# Patient Record
Sex: Male | Born: 1965 | Race: White | Hispanic: No | Marital: Married | Smoking: Current every day smoker
Health system: Southern US, Community
[De-identification: ages and names within clinical notes are randomized; demographics above are authoritative.]

## PROBLEM LIST (undated history)

## (undated) DIAGNOSIS — N401 Enlarged prostate with lower urinary tract symptoms: Secondary | ICD-10-CM

## (undated) DIAGNOSIS — F909 Attention-deficit hyperactivity disorder, unspecified type: Secondary | ICD-10-CM

## (undated) DIAGNOSIS — G4700 Insomnia, unspecified: Secondary | ICD-10-CM

## (undated) DIAGNOSIS — I10 Essential (primary) hypertension: Secondary | ICD-10-CM

## (undated) DIAGNOSIS — F411 Generalized anxiety disorder: Secondary | ICD-10-CM

## (undated) DIAGNOSIS — M25569 Pain in unspecified knee: Secondary | ICD-10-CM

## (undated) DIAGNOSIS — E781 Pure hyperglyceridemia: Secondary | ICD-10-CM

## (undated) HISTORY — DX: Attention-deficit hyperactivity disorder, unspecified type: F90.9

## (undated) HISTORY — DX: Essential (primary) hypertension: I10

## (undated) HISTORY — DX: Pure hyperglyceridemia: E78.1

## (undated) HISTORY — DX: Insomnia, unspecified: G47.00

## (undated) HISTORY — DX: Pain in unspecified knee: M25.569

## (undated) HISTORY — DX: Benign prostatic hyperplasia with lower urinary tract symptoms: N40.1

## (undated) HISTORY — DX: Generalized anxiety disorder: F41.1

---

## 2000-01-22 ENCOUNTER — Emergency Department (HOSPITAL_COMMUNITY): Admission: EM | Admit: 2000-01-22 | Discharge: 2000-01-22 | Payer: Self-pay | Admitting: Emergency Medicine

## 2000-01-22 ENCOUNTER — Encounter: Payer: Self-pay | Admitting: Emergency Medicine

## 2001-12-14 ENCOUNTER — Encounter: Payer: Self-pay | Admitting: Emergency Medicine

## 2001-12-14 ENCOUNTER — Emergency Department (HOSPITAL_COMMUNITY): Admission: EM | Admit: 2001-12-14 | Discharge: 2001-12-14 | Payer: Self-pay | Admitting: Emergency Medicine

## 2008-06-06 ENCOUNTER — Ambulatory Visit: Payer: Self-pay | Admitting: Family Medicine

## 2008-06-06 DIAGNOSIS — I10 Essential (primary) hypertension: Secondary | ICD-10-CM

## 2008-06-06 DIAGNOSIS — L723 Sebaceous cyst: Secondary | ICD-10-CM

## 2008-06-06 DIAGNOSIS — G4700 Insomnia, unspecified: Secondary | ICD-10-CM

## 2008-06-06 HISTORY — DX: Insomnia, unspecified: G47.00

## 2008-06-06 HISTORY — DX: Essential (primary) hypertension: I10

## 2008-06-26 ENCOUNTER — Ambulatory Visit: Payer: Self-pay | Admitting: Family Medicine

## 2008-06-29 ENCOUNTER — Telehealth: Payer: Self-pay | Admitting: Family Medicine

## 2008-07-10 ENCOUNTER — Ambulatory Visit: Payer: Self-pay | Admitting: Family Medicine

## 2009-01-15 ENCOUNTER — Ambulatory Visit: Payer: Self-pay | Admitting: Family Medicine

## 2009-01-15 DIAGNOSIS — F909 Attention-deficit hyperactivity disorder, unspecified type: Secondary | ICD-10-CM

## 2009-01-15 DIAGNOSIS — N138 Other obstructive and reflux uropathy: Secondary | ICD-10-CM

## 2009-01-15 DIAGNOSIS — F411 Generalized anxiety disorder: Secondary | ICD-10-CM | POA: Insufficient documentation

## 2009-01-15 DIAGNOSIS — K219 Gastro-esophageal reflux disease without esophagitis: Secondary | ICD-10-CM

## 2009-01-15 DIAGNOSIS — N401 Enlarged prostate with lower urinary tract symptoms: Secondary | ICD-10-CM | POA: Insufficient documentation

## 2009-01-15 HISTORY — DX: Attention-deficit hyperactivity disorder, unspecified type: F90.9

## 2009-01-15 HISTORY — DX: Generalized anxiety disorder: F41.1

## 2009-01-15 HISTORY — DX: Other obstructive and reflux uropathy: N13.8

## 2009-01-16 LAB — CONVERTED CEMR LAB
Alkaline Phosphatase: 93 units/L (ref 39–117)
BUN: 25 mg/dL — ABNORMAL HIGH (ref 6–23)
Creatinine, Ser: 1.1 mg/dL (ref 0.40–1.50)
Glucose, Bld: 96 mg/dL (ref 70–99)
HDL: 50 mg/dL (ref >39)
LDL Cholesterol: 70 mg/dL (ref 0–99)
Sodium: 141 meq/L (ref 135–145)
Total Bilirubin: 0.6 mg/dL (ref 0.3–1.2)
Total CHOL/HDL Ratio: 3.2
Triglycerides: 206 mg/dL — ABNORMAL HIGH (ref <150)
VLDL: 41 mg/dL — ABNORMAL HIGH (ref 0–40)

## 2009-01-25 ENCOUNTER — Telehealth: Payer: Self-pay | Admitting: Family Medicine

## 2009-08-12 ENCOUNTER — Ambulatory Visit: Payer: Self-pay | Admitting: Family Medicine

## 2009-08-12 ENCOUNTER — Encounter: Admission: RE | Admit: 2009-08-12 | Discharge: 2009-08-12 | Payer: Self-pay | Admitting: Family Medicine

## 2009-08-12 DIAGNOSIS — E781 Pure hyperglyceridemia: Secondary | ICD-10-CM

## 2009-08-12 DIAGNOSIS — M279 Disease of jaws, unspecified: Secondary | ICD-10-CM | POA: Insufficient documentation

## 2009-08-12 DIAGNOSIS — M25569 Pain in unspecified knee: Secondary | ICD-10-CM | POA: Insufficient documentation

## 2009-08-12 DIAGNOSIS — M79609 Pain in unspecified limb: Secondary | ICD-10-CM | POA: Insufficient documentation

## 2009-08-12 HISTORY — DX: Pure hyperglyceridemia: E78.1

## 2009-08-12 HISTORY — DX: Pain in unspecified knee: M25.569

## 2009-08-14 ENCOUNTER — Telehealth (INDEPENDENT_AMBULATORY_CARE_PROVIDER_SITE_OTHER): Payer: Self-pay | Admitting: *Deleted

## 2009-09-06 ENCOUNTER — Encounter: Payer: Self-pay | Admitting: Family Medicine

## 2009-10-07 ENCOUNTER — Telehealth (INDEPENDENT_AMBULATORY_CARE_PROVIDER_SITE_OTHER): Payer: Self-pay | Admitting: *Deleted

## 2009-10-11 ENCOUNTER — Ambulatory Visit (HOSPITAL_COMMUNITY): Admission: RE | Admit: 2009-10-11 | Discharge: 2009-10-11 | Payer: Self-pay | Admitting: Orthopedic Surgery

## 2009-11-01 ENCOUNTER — Encounter: Payer: Self-pay | Admitting: Family Medicine

## 2009-12-11 ENCOUNTER — Encounter: Payer: Self-pay | Admitting: Family Medicine

## 2010-02-25 ENCOUNTER — Ambulatory Visit: Payer: Self-pay | Admitting: Internal Medicine

## 2010-02-25 LAB — CONVERTED CEMR LAB
BUN: 17 mg/dL (ref 6–23)
Creatinine, Ser: 1.15 mg/dL (ref 0.40–1.50)

## 2010-02-26 ENCOUNTER — Encounter: Payer: Self-pay | Admitting: Internal Medicine

## 2010-09-30 NOTE — Consult Note (Signed)
Summary: [facility]  [facility]   Imported By: Lanelle Bal 01/03/2010 13:13:17  _____________________________________________________________________  External Attachment:    Type:   Image     Comment:   External Document

## 2010-09-30 NOTE — Consult Note (Signed)
Summary: [facility]  [facility]   Imported By: Lanelle Bal 09/24/2009 10:50:29  _____________________________________________________________________  External Attachment:    Type:   Image     Comment:   External Document

## 2010-09-30 NOTE — Assessment & Plan Note (Signed)
Summary: FU TO GET BP MEDS//VGJ   Vital Signs:  Patient profile:   45 year old male Height:      74 inches Weight:      221.25 pounds BMI:     28.51 O2 Sat:      96 % on Room air Temp:     98.5 degrees F oral Pulse rate:   89 / minute Pulse rhythm:   regular Resp:     18 per minute BP sitting:   108 / 60  (right arm) Cuff size:   large  Vitals Entered By: Glendell Docker CMA (February 25, 2010 3:48 PM)  O2 Flow:  Room air CC: Rm 3- Medication Refills , Lower Extremity Joint pain Is Patient Diabetic? No Pain Assessment Patient in pain? no      Comments Refill on lisinopril /hct and vicodin for knee pain/Arthritis, last appointment with [facility] was 01/13/2010. He states that he was advised by surgeon if he starts to have pain in his other knee, he would need to follow up with his primary care.    Primary Care Provider:  Nani Gasser MD  CC:  Rm 3- Medication Refills  and Lower Extremity Joint pain.  History of Present Illness:  Lower Extremity Joint Pain      This is a 45 year old man who presents with Lower Extremity Joint pain.  The patient reports stiffness for >1 hr and decreased ROM, but denies swelling and redness.  The pain is located in the left knee.  The pain is described as intermittent and activity related.  he was prev seen for right knee pain by ortho - [facility] .  s/p arthrocopy - medical meniscal pathology  htn - stable.  no dizziness, chest pain or cough  Preventive Screening-Counseling & Management  Alcohol-Tobacco     Smoking Status: current  Allergies (verified): No Known Drug Allergies  Past History:  Past Medical History: Psych - Dr. Sandria Manly in [place].    Past Surgical History: None    Family History: Prostate Ca MI HTN   Social History: Airline pilot for [employer]. Bachelors degree.  Quit tobacco Single Alcohol use-no Drug use-no   Regular exercise-yes  Physical Exam  General:  alert, well-developed, and  well-nourished.   Lungs:  Normal respiratory effort, chest expands symmetrically. Lungs are clear to auscultation, no crackles or wheezes. Heart:  Normal rate and regular rhythm. S1 and S2 normal without gallop, murmur, click, rub or other extra sounds.  NO carotid bruist.  Msk:  left knee - joint is stable.  medical knee tenderness   Impression & Recommendations:  Problem # 1:  KNEE PAIN (ICD-719.46) Left knee pain.  refilled pain meds.  arrange f/u with ortho  The following medications were removed from the medication list:    Hydrocodone-acetaminophen 5-500 Mg Tabs (Hydrocodone-acetaminophen) .Marland Kitchen... 1-2 by mouth at bedtime for severe pain relief. His updated medication list for this problem includes:    Tramadol Hcl 50 Mg Tabs (Tramadol hcl) .Marland Kitchen... Take 1 tablet by mouth two times a day as needed for pain  Orders: Orthopedic Referral (Ortho)  Problem # 2:  ESSENTIAL HYPERTENSION, BENIGN (ICD-401.1) Assessment: Unchanged stable.  Maintain current medication regimen.  His updated medication list for this problem includes:    Lisinopril-hydrochlorothiazide 20-12.5 Mg Tabs (Lisinopril-hydrochlorothiazide) .Marland Kitchen... Take one tablet by mouth once a day  BP today: 108/60 Prior BP: 131/68 (08/12/2009)  Prior 10 Yr Risk Heart Disease: 4 % (08/12/2009)  Labs Reviewed:  K+: 4.1 (01/15/2009) Creat: : 1.10 (01/15/2009)   Chol: 161 (01/15/2009)   HDL: 50 (01/15/2009)   LDL: 70 (01/15/2009)   TG: 206 (01/15/2009)  Complete Medication List: 1)  Lisinopril-hydrochlorothiazide 20-12.5 Mg Tabs (Lisinopril-hydrochlorothiazide) .... Take one tablet by mouth once a day 2)  Nexium 40 Mg Cpdr (Esomeprazole magnesium) .... Take 1 tablet by mouth once a day 3)  Adderall 20 Mg Tabs (Amphetamine-dextroamphetamine) .Marland Kitchen.. 1 by mouth three times a day 4)  Klonopin 1 Mg Tabs (Clonazepam) .Marland Kitchen.. 1 by mouth 2-3 times daily as needed 5)  Fish Oil 1000 Mg Caps (Omega-3 fatty acids) .... 2 by mouth daily 6)  Tramadol  Hcl 50 Mg Tabs (Tramadol hcl) .... Take 1 tablet by mouth two times a day as needed for pain  Other Orders: TLB-BMP (Basic Metabolic Panel-BMET) (80048-METABOL)  Patient Instructions: 1)  Please schedule a follow-up appointment in 6 months with Dr. Linford Arnold 2)  Our office will contact you re:  referral to Dr. Charlann Boxer 3)  Take 1 extra strenth tylenol with 50 mg tramadol together two times a day as needed. Prescriptions: TRAMADOL HCL 50 MG TABS (TRAMADOL HCL) Take 1 tablet by mouth two times a day as needed for pain  #30 x 0   Entered and Authorized by:   D. Thomos Lemons DO   Signed by:   D. Thomos Lemons DO on 02/25/2010   Method used:   Electronically to        [facility] (retail)       [address]       [place], [place]  [address]       Ph: [phone]       Fax: [phone]   RxID:   [id number] NEXIUM 40 MG CPDR (ESOMEPRAZOLE MAGNESIUM) Take 1 tablet by mouth once a day  #30 x 5   Entered and Authorized by:   D. Thomos Lemons DO   Signed by:   D. Thomos Lemons DO on 02/25/2010   Method used:   Electronically to        [facility] (retail)       [address]       [place], [place]  [address]       Ph: [phone]       Fax: [phone]   RxID:   [id number] LISINOPRIL-HYDROCHLOROTHIAZIDE 20-12.5 MG TABS (LISINOPRIL-HYDROCHLOROTHIAZIDE) Take one tablet by mouth once a day  #30 Tablet x 5   Entered and Authorized by:   D. Thomos Lemons DO   Signed by:   D. Thomos Lemons DO on 02/25/2010   Method used:   Electronically to        [facility] (retail)       [address]       [place], [place]  [address]       Ph: [phone]       Fax: [phone]   RxID:   [id number]   Current Allergies (reviewed today): No known allergies

## 2010-09-30 NOTE — Letter (Signed)
   [place] [facility] [address] [place], [place]  [address]  [place] Phone: [phone]      February 26, 2010   [place] [facility] [address] [place], [place] [address]  RE:  LAB RESULTS  Dear  Gary Stevens,  The following is an interpretation of your most recent lab tests.  Please take note of any instructions provided or changes to medications that have resulted from your lab work.  ELECTROLYTES:  Good - no changes needed  KIDNEY FUNCTION TESTS:  Good - no changes needed         Sincerely Yours,    Dr. Thomos Lemons  Appended Document:  Letter mailed.

## 2010-09-30 NOTE — Letter (Signed)
Summary: Letters Regarding Workers Comp/TPA for [employer]  Letters Regarding Workers Comp/TPA for [employer]   Imported By: Lanelle Bal 11/14/2009 12:06:57  _____________________________________________________________________  External Attachment:    Type:   Image     Comment:   External Document

## 2010-09-30 NOTE — Progress Notes (Signed)
Summary: left msg to call  Phone Note Call from Patient   Caller: Patient Summary of Call: pt called left msg on VM, wanted a call back. Caled and got VM left msg to call Kandice Hams  October 07, 2009 4:18 PM  Initial call taken by: Kandice Hams,  October 07, 2009 4:18 PM  Follow-up for Phone Call        pt called back was seen 08/12/09, saw Dr Charlann Boxer downstairs, not pleased with doctor was put on tRAMDOL WHICH IS NOT TOUCHING PAIN, pt wants percocet as given before.  Pt informed per Dr Eppie Gibson if not pleased can go to Ortho of choice and if  need referral we can send them pt agreed.Kandice Hams  October 07, 2009 4:46 PM  Follow-up by: Kandice Hams,  October 07, 2009 4:46 PM

## 2010-10-27 ENCOUNTER — Telehealth: Payer: Self-pay | Admitting: Family Medicine

## 2010-11-06 NOTE — Progress Notes (Signed)
Summary: bp refill request  Phone Note Refill Request Message from:  Patient on October 27, 2010 9:22 AM  Refills Requested: Medication #1:  LISINOPRIL-HYDROCHLOROTHIAZIDE 20-12.5 MG TABS Take one tablet by mouth once a day Pt has lost his job and insurance and needs a refill called into [facility] in [place]. Call patient and let him know [phone]  Initial call taken by: Michaelle Copas,  October 27, 2010 9:29 AM  Follow-up for Phone Call        OK for 90 day refill.  Follow-up by: Nani Gasser MD,  October 27, 2010 10:09 AM  Additional Follow-up for Phone Call Additional follow up Details #1::        called pt an told him refill sent for 90 days but needs to f/u . he has not been seen by Dr. Linford Arnold since 2010 Additional Follow-up by: Avon Gully CMA, Duncan Dull),  October 27, 2010 2:01 PM    Prescriptions: LISINOPRIL-HYDROCHLOROTHIAZIDE 20-12.5 MG TABS (LISINOPRIL-HYDROCHLOROTHIAZIDE) Take one tablet by mouth once a day  #90 x 0   Entered by:   Avon Gully CMA, (AAMA)   Authorized by:   Nani Gasser MD   Signed by:   Avon Gully CMA, (AAMA) on 10/27/2010   Method used:   Electronically to        [facility] [phone]* (retail)       [address]       [place], [place]  [address]       Ph: [phone]       Fax: [phone]   RxID:   [id number]

## 2011-12-20 IMAGING — CR DG ORBITS FOR FOREIGN BODY
2 series · 2 of 2 positions shown · non-contrast
Comparison: None.

CLINICAL DATA: Pre MRI, evaluate for orbital foreign body

ORBITS FOR FOREIGN BODY - 2 VIEW

[w waters (1 of 2)]
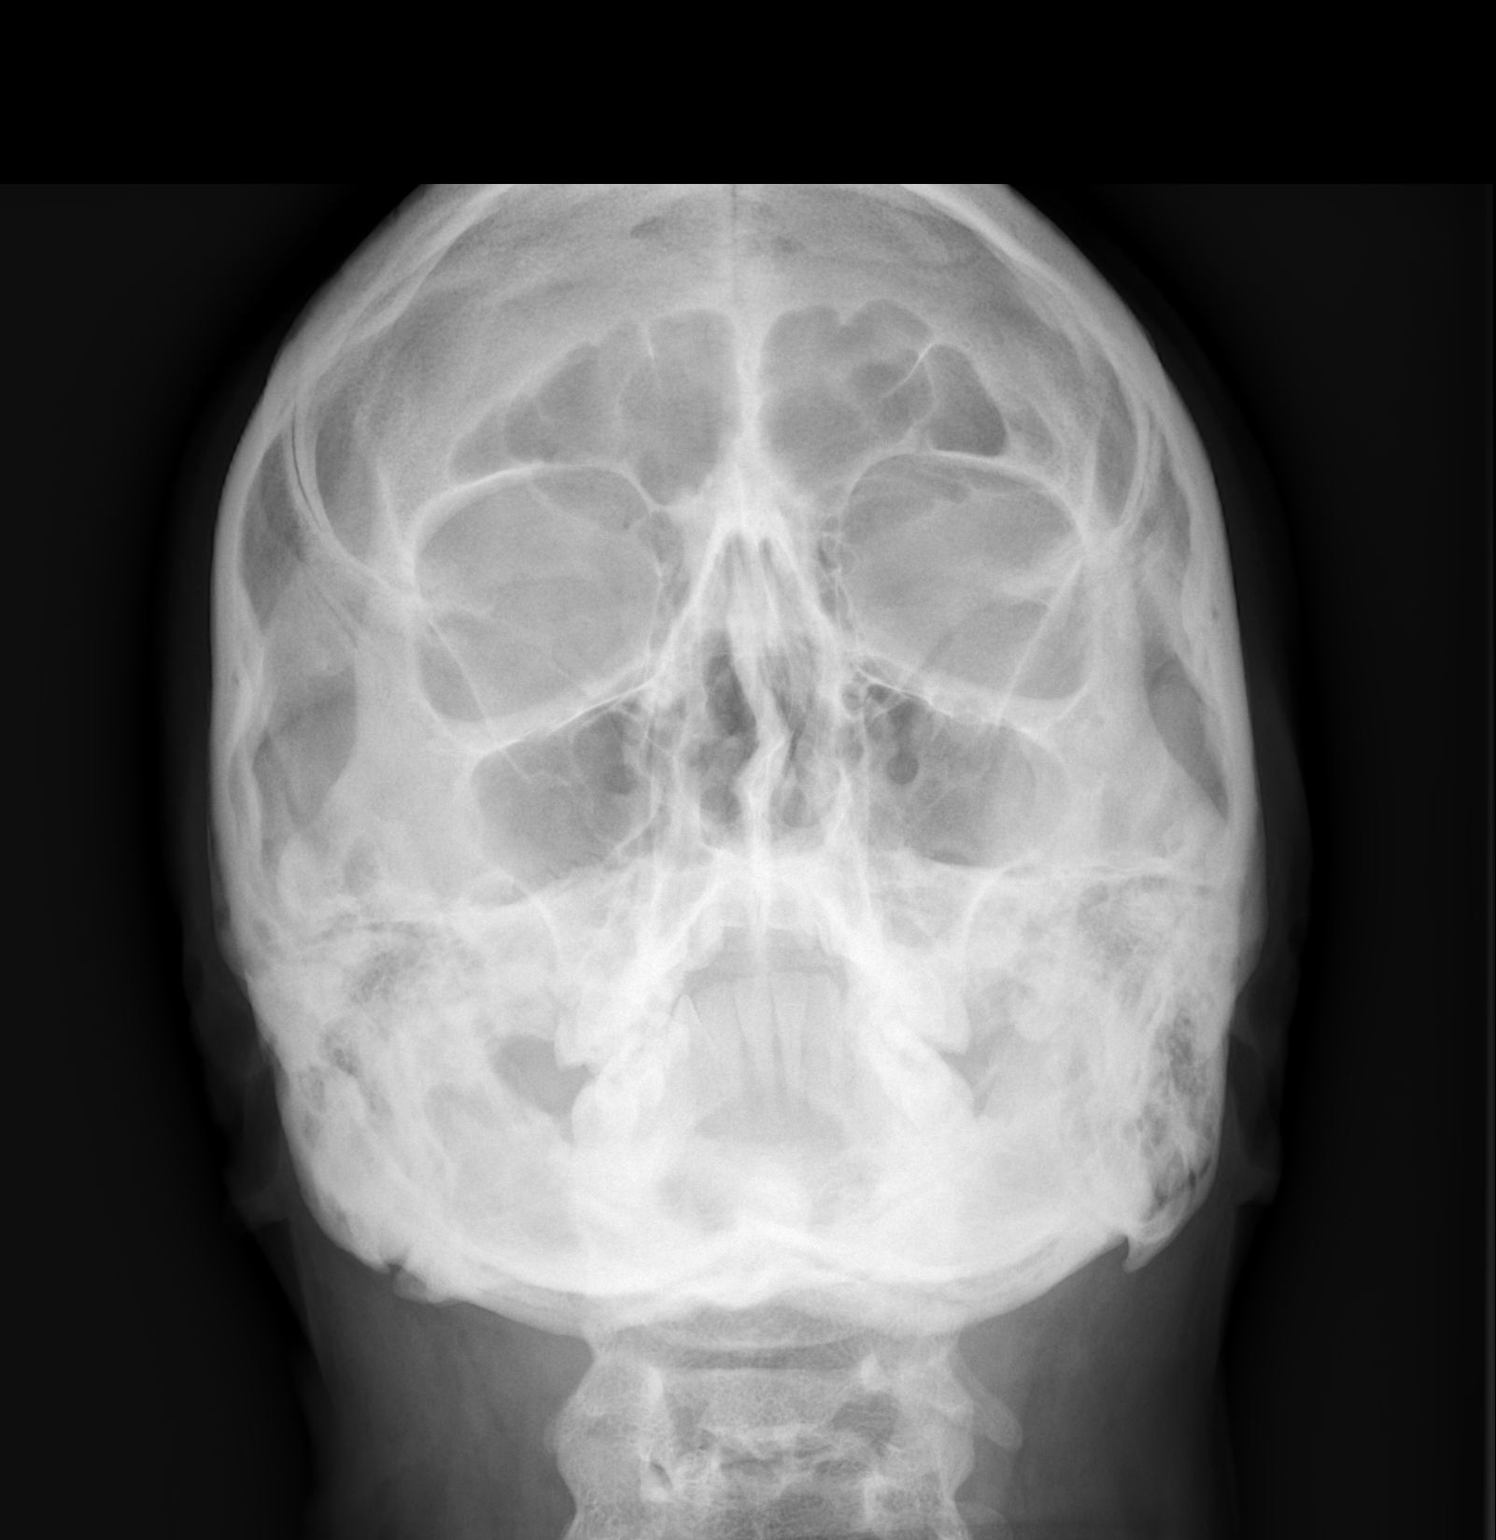

[w waters (2 of 2)]
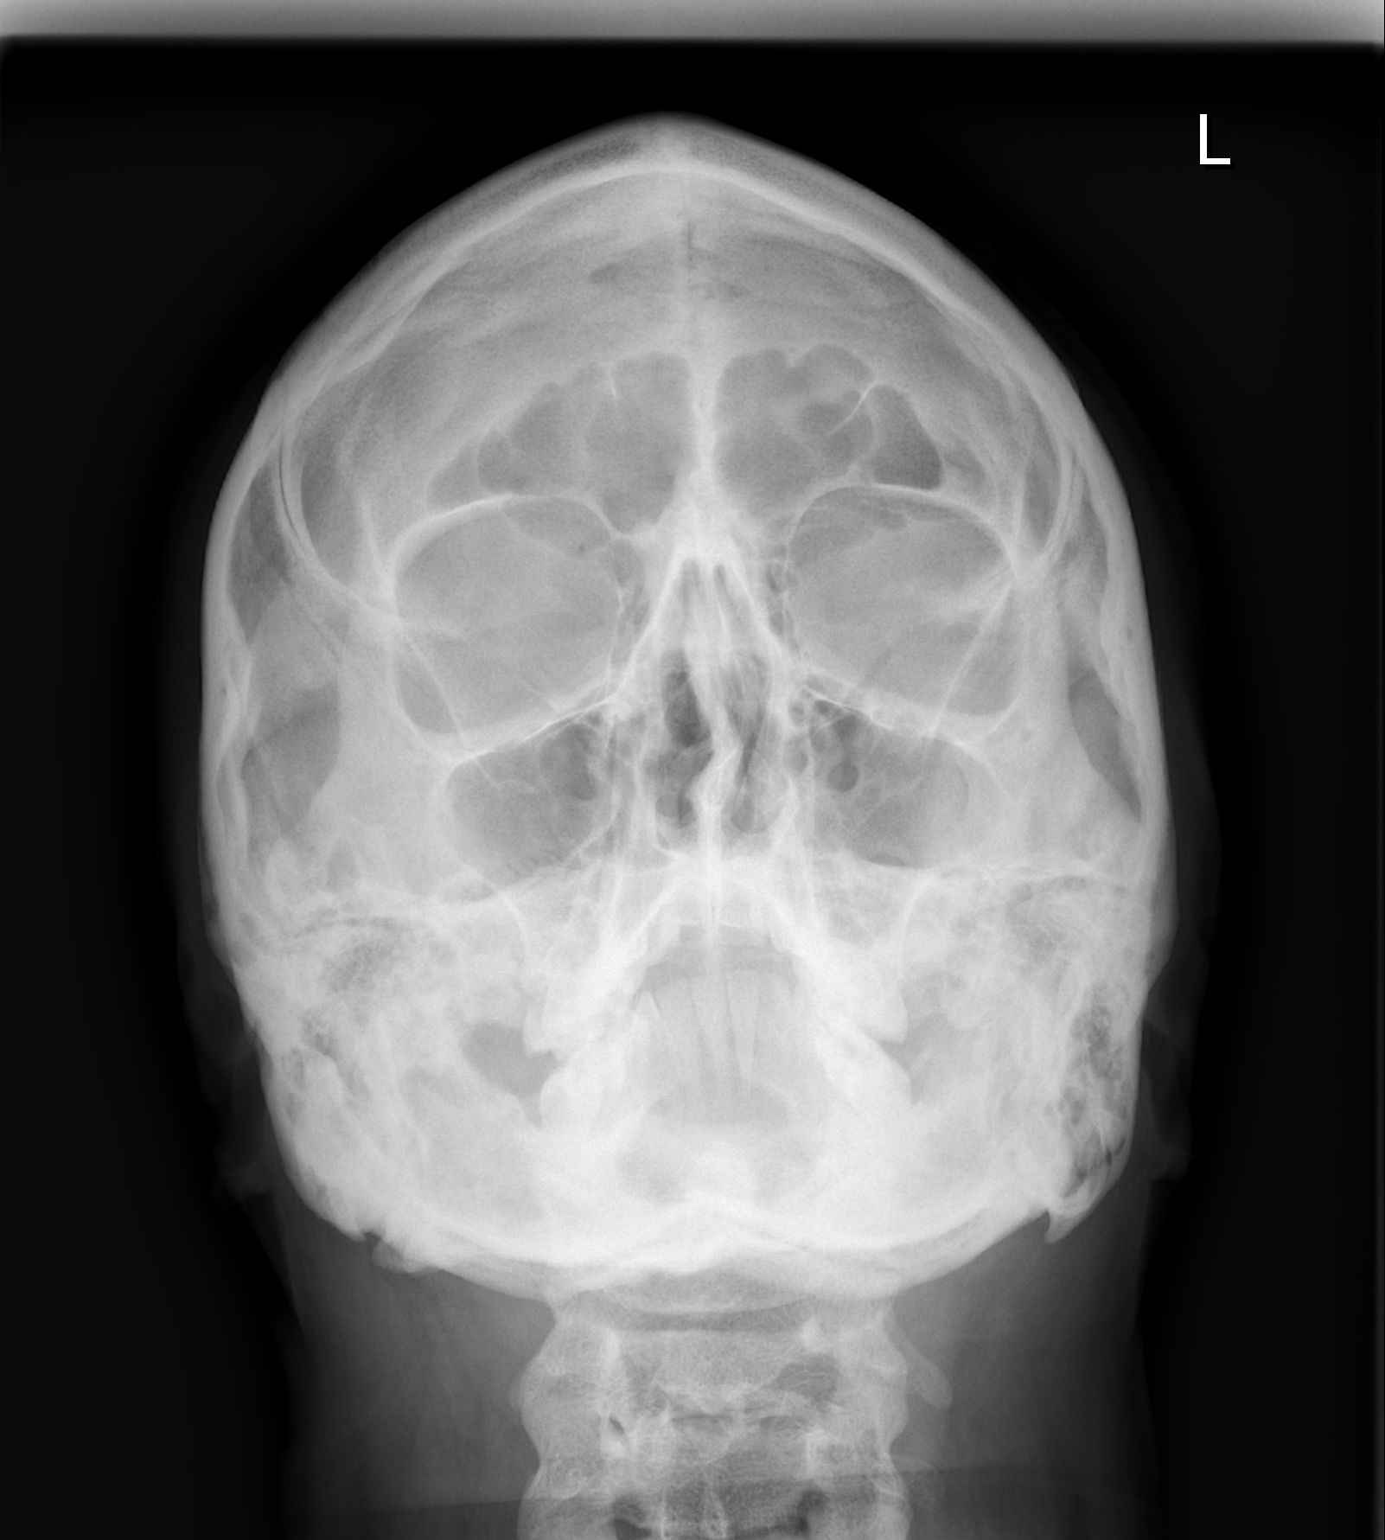

[2 of 2 positions shown; findings below may reference images not displayed]

FINDINGS: Views of the orbits were obtained with the the patient
looking to the left and looking to the right.  No orbital metallic
foreign body is seen.  The sinuses are clear.  No bony abnormality
is noted.
IMPRESSION: No orbital metallic foreign body.

## 2012-12-02 ENCOUNTER — Ambulatory Visit: Payer: Self-pay | Admitting: Family Medicine

## 2012-12-02 ENCOUNTER — Encounter: Payer: Self-pay | Admitting: Family Medicine

## 2012-12-02 ENCOUNTER — Ambulatory Visit (INDEPENDENT_AMBULATORY_CARE_PROVIDER_SITE_OTHER): Payer: BC Managed Care – PPO | Admitting: Family Medicine

## 2012-12-02 VITALS — BP 124/82 | HR 68 | Wt 260.0 lb

## 2012-12-02 DIAGNOSIS — I10 Essential (primary) hypertension: Secondary | ICD-10-CM

## 2012-12-02 MED ORDER — LISINOPRIL-HYDROCHLOROTHIAZIDE 20-25 MG PO TABS: 1.0000 | ORAL_TABLET | Freq: Every day | ORAL | Status: DC

## 2012-12-02 NOTE — Progress Notes (Signed)
CC: Gary Stevens is a 47 y.o. male is here for Hypertension   Subjective: HPI:  Patient presents for followup after 3 years absence from physically being in our office  Followup hypertension: Patient will be running out of lisinopril hydrochlorothiazide at the end of the month. He has only one outside blood pressure, 142/80 at his psychiatrist's office months ago. He denies missed doses or any side effects or intolerances of the above regimen. He denies headaches, vision change, motor sensory disturbance, angioedema, cough, shortness of breath, chest pain, muscle weakness, muscle cramps, nor limb claudication.   Review Of Systems Outlined In HPI  Past Medical History  Diagnosis Date  . KNEE PAIN 08/12/2009    Qualifier: Diagnosis of  By: Linford Arnold MD, Santina Evans    . INSOMNIA 06/06/2008    Qualifier: Diagnosis of  By: Linford Arnold MD, Santina Evans    . HYPERTROPHY PROSTATE W/UR OBST & OTH LUTS 01/15/2009    Qualifier: Diagnosis of  By: Linford Arnold MD, Santina Evans    . HYPERTRIGLYCERIDEMIA 08/12/2009    Qualifier: Diagnosis of  By: Linford Arnold MD, Santina Evans    . Essential hypertension, benign 06/06/2008    Qualifier: Diagnosis of  By: Linford Arnold MD, Santina Evans    . Anxiety state, unspecified 01/15/2009    Qualifier: Diagnosis of  By: Linford Arnold MD, Santina Evans    . ADHD 01/15/2009    Qualifier: Diagnosis of  By: Linford Arnold MD, Santina Evans       History reviewed. No pertinent family history.   History  Substance Use Topics  . Smoking status: Former Smoker    Quit date: 09/03/2012  . Smokeless tobacco: Not on file  . Alcohol Use: Not on file     Objective: Filed Vitals:   12/02/12 1611  BP: 124/82  Pulse: 68    General: Alert and Oriented, No Acute Distress HEENT: Pupils equal, round, reactive to light. Conjunctivae clear.   Moist mucous membranes, pharynx without inflammation nor lesions.  Neck supple without palpable lymphadenopathy nor abnormal masses. Lungs: Clear to auscultation bilaterally, no  wheezing/ronchi/rales.  Comfortable work of breathing. Good air movement. Cardiac: Regular rate and rhythm. Normal S1/S2.  No murmurs, rubs, nor gallops.   Abdomen: Soft nontender Extremities: No peripheral edema.  Strong peripheral pulses.  Mental Status: No depression, anxiety, nor agitation. Skin: Warm and dry.  Assessment & Plan: Gary Stevens was seen today for hypertension.  Diagnoses and associated orders for this visit:  Essential hypertension, benign - lisinopril-hydrochlorothiazide (PRINZIDE,ZESTORETIC) 20-25 MG per tablet; Take 1 tablet by mouth daily.    Essential hypertension: Controlled.  I've asked the patient to have cholesterol and metabolic panel performed as soon as possible once fasting.  He did not want lab slips, he will call us when ready. Stressed the importance of checking kidney function and electrolytes while on hydrochlorothiazide/lisinopril which I would like him to continue.  Congratulated him on his smoking cessation  Return in about 2 weeks (around 12/16/2012) for bloodwork.

## 2013-02-04 DIAGNOSIS — K279 Peptic ulcer, site unspecified, unspecified as acute or chronic, without hemorrhage or perforation: Secondary | ICD-10-CM | POA: Insufficient documentation

## 2013-02-04 DIAGNOSIS — F4010 Social phobia, unspecified: Secondary | ICD-10-CM | POA: Insufficient documentation

## 2013-02-04 DIAGNOSIS — F4001 Agoraphobia with panic disorder: Secondary | ICD-10-CM | POA: Insufficient documentation

## 2013-02-04 DIAGNOSIS — F4310 Post-traumatic stress disorder, unspecified: Secondary | ICD-10-CM | POA: Insufficient documentation

## 2013-02-04 HISTORY — DX: Agoraphobia with panic disorder: F40.01

## 2013-10-10 ENCOUNTER — Encounter: Payer: Self-pay | Admitting: Physician Assistant

## 2013-10-10 ENCOUNTER — Ambulatory Visit (INDEPENDENT_AMBULATORY_CARE_PROVIDER_SITE_OTHER): Payer: BC Managed Care – PPO | Admitting: Physician Assistant

## 2013-10-10 VITALS — BP 139/86 | HR 73 | Temp 97.8°F | Ht 75.0 in | Wt 278.0 lb

## 2013-10-10 DIAGNOSIS — I10 Essential (primary) hypertension: Secondary | ICD-10-CM

## 2013-10-10 DIAGNOSIS — J069 Acute upper respiratory infection, unspecified: Secondary | ICD-10-CM

## 2013-10-10 DIAGNOSIS — K219 Gastro-esophageal reflux disease without esophagitis: Secondary | ICD-10-CM

## 2013-10-10 DIAGNOSIS — E781 Pure hyperglyceridemia: Secondary | ICD-10-CM

## 2013-10-10 NOTE — Progress Notes (Signed)
   Subjective:    Patient ID: Gary Stevens, male    DOB: [birth year], 48 y.o.   MRN: [id number]  HPI  No concerns today. Needs refills.   HTN- pt needs refill today. He has not had labs since 2010. Seen Dr. Ivan AnchorsHommel in 2014 but did not get labs. Denies any CP, palpitations, SOB, headaches, or vision changes. 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 Tries to make healthier options on the road if he can.    URI-pt had cold symptoms for 1 week. Seems to be getting better. Taking OtC decongestants and goody's powder. No fever, chills, muscle aches, nausea or vomiting. Having some sinus pressure and ear pain. He does have a cough with no production.   GERd- Pt has hx of gastric ulcers when in the [place]. Omeprazole OTC daily keeeps symptoms under control .   Review of Systems     Objective:   Physical Exam  Constitutional: He is oriented to person, place, and time. He appears well-developed and well-nourished.  HENT:  Head: Normocephalic and atraumatic.  Cardiovascular: Normal rate, regular rhythm and normal heart sounds.   Pulmonary/Chest: Effort normal and breath sounds normal.  Neurological: He is alert and oriented to person, place, and time.  Skin: Skin is dry.  Psychiatric: He has a normal mood and affect. His behavior is normal.          Assessment & Plan:  HTN/Hypertriglyeridemia- Pt needs labs today. He has not had since 2010. Will refill medication when get's labs. Gave rx for lipid and cmp. Discussed need to follow up on medication. Pt voiced understanding. Pt taking decongestant today due to URI. BP still in normal range. Discussed regular exercise and weight loss would help with BP.   GERD- continue to take omeprazole OTC as needed to control symptoms. Consider GERD diet to limit how often needs to omeprazole.   uRI- continue with symptom management as doing. Should improve in next 3-5 days.

## 2013-10-11 LAB — LIPID PANEL
CHOL/HDL RATIO: 4 ratio
Cholesterol: 157 mg/dL (ref 0–200)
HDL: 39 mg/dL — AB (ref >39)
LDL CALC: 68 mg/dL (ref 0–99)
Triglycerides: 252 mg/dL — ABNORMAL HIGH (ref <150)
VLDL: 50 mg/dL — AB (ref 0–40)

## 2013-10-11 LAB — COMPLETE METABOLIC PANEL WITH GFR
ALK PHOS: 113 U/L (ref 39–117)
ALT: 43 U/L (ref 0–53)
AST: 34 U/L (ref 0–37)
Albumin: 4 g/dL (ref 3.5–5.2)
BILIRUBIN TOTAL: 0.5 mg/dL (ref 0.2–1.2)
BUN: 15 mg/dL (ref 6–23)
CO2: 30 mEq/L (ref 19–32)
Calcium: 9 mg/dL (ref 8.4–10.5)
Chloride: 103 mEq/L (ref 96–112)
Creat: 1.1 mg/dL (ref 0.50–1.35)
GFR, EST NON AFRICAN AMERICAN: 79 mL/min
GFR, Est African American: >89 mL/min
GLUCOSE: 118 mg/dL — AB (ref 70–99)
Potassium: 4 mEq/L (ref 3.5–5.3)
SODIUM: 141 meq/L (ref 135–145)
TOTAL PROTEIN: 6.6 g/dL (ref 6.0–8.3)

## 2013-10-12 ENCOUNTER — Other Ambulatory Visit: Payer: Self-pay | Admitting: *Deleted

## 2013-10-12 DIAGNOSIS — I10 Essential (primary) hypertension: Secondary | ICD-10-CM

## 2013-10-12 MED ORDER — LISINOPRIL-HYDROCHLOROTHIAZIDE 20-25 MG PO TABS: 1.0000 | ORAL_TABLET | Freq: Every day | ORAL | Status: DC

## 2013-12-31 ENCOUNTER — Other Ambulatory Visit: Payer: Self-pay | Admitting: Family Medicine

## 2014-02-14 ENCOUNTER — Ambulatory Visit: Payer: BC Managed Care – PPO | Admitting: Physician Assistant

## 2014-06-04 ENCOUNTER — Other Ambulatory Visit: Payer: Self-pay

## 2014-06-04 MED ORDER — LISINOPRIL-HYDROCHLOROTHIAZIDE 20-25 MG PO TABS: 1.0000 | ORAL_TABLET | Freq: Every day | ORAL | Status: DC

## 2014-06-04 NOTE — Telephone Encounter (Signed)
Patient advised to make an appointment before more refills. 

## 2014-07-23 ENCOUNTER — Telehealth: Payer: Self-pay | Admitting: Family Medicine

## 2014-07-23 MED ORDER — LISINOPRIL-HYDROCHLOROTHIAZIDE 20-25 MG PO TABS: 1.0000 | ORAL_TABLET | Freq: Every day | ORAL | Status: DC

## 2014-07-23 NOTE — Telephone Encounter (Signed)
Please let patient know prescription was sent

## 2014-07-23 NOTE — Telephone Encounter (Signed)
Patient is out of BP meds and needs some called in until he can come in next Monday, 11/30.  He wants it sent to [facility] [address].

## 2014-07-24 NOTE — Telephone Encounter (Signed)
Left detailed message.   

## 2014-07-30 ENCOUNTER — Ambulatory Visit: Payer: BC Managed Care – PPO | Admitting: Family Medicine

## 2014-08-02 ENCOUNTER — Encounter: Payer: Self-pay | Admitting: Family Medicine

## 2014-08-02 ENCOUNTER — Ambulatory Visit (INDEPENDENT_AMBULATORY_CARE_PROVIDER_SITE_OTHER): Payer: BC Managed Care – PPO | Admitting: Family Medicine

## 2014-08-02 VITALS — BP 135/80 | HR 77 | Ht 75.0 in | Wt 246.0 lb

## 2014-08-02 DIAGNOSIS — I10 Essential (primary) hypertension: Secondary | ICD-10-CM

## 2014-08-02 DIAGNOSIS — F411 Generalized anxiety disorder: Secondary | ICD-10-CM | POA: Diagnosis not present

## 2014-08-02 MED ORDER — CLONAZEPAM 1 MG PO TABS: 0.5000 mg | ORAL_TABLET | Freq: Three times a day (TID) | ORAL | Status: DC | PRN

## 2014-08-02 MED ORDER — LISINOPRIL-HYDROCHLOROTHIAZIDE 20-25 MG PO TABS: 1.0000 | ORAL_TABLET | Freq: Every day | ORAL | Status: DC

## 2014-08-02 NOTE — Progress Notes (Signed)
   Subjective:    Patient ID: Gary Stevens, male    DOB: 1966-03-05, 48 y.o.   MRN: [id number]  HPI Hypertension- Pt denies chest pain, SOB, dizziness, or heart palpitations.  Taking meds as directed w/o problems.  Denies medication side effects.  He cut out soda, chips.  He is down 32 lbs. He is on medical leave right now to take care of his parents.    Would like to have clonazepam refill that he uses for anxiety. He is not working again until Feb, so would like me to refill his meds until he can get back in with his pscyh.  I told him that I would fill the medications this time but he needs to contact his psychiatrist him know what's going on and that he will get back in with him in February.  Review of Systems     Objective:   Physical Exam  Constitutional: He is oriented to person, place, and time. He appears well-developed and well-nourished.  HENT:  Head: Normocephalic and atraumatic.  Cardiovascular: Normal rate, regular rhythm and normal heart sounds.   Pulmonary/Chest: Effort normal and breath sounds normal.  Neurological: He is alert and oriented to person, place, and time.  Skin: Skin is warm and dry.  Psychiatric: He has a normal mood and affect. His behavior is normal.          Assessment & Plan:  HTN -  Well controlled.  Continue current regimen. Due for CMP and fasting lipid panel. Follow-up in 6 months.  Generalized anxiety disorder-I did refill his clonazepam today. But in February he wanted to get this back again from his psychiatrist.

## 2014-08-03 LAB — COMPLETE METABOLIC PANEL WITH GFR
ALT: 38 U/L (ref 0–53)
AST: 30 U/L (ref 0–37)
Albumin: 4.8 g/dL (ref 3.5–5.2)
Alkaline Phosphatase: 102 U/L (ref 39–117)
BUN: 16 mg/dL (ref 6–23)
CALCIUM: 9.6 mg/dL (ref 8.4–10.5)
CHLORIDE: 101 meq/L (ref 96–112)
CO2: 29 meq/L (ref 19–32)
CREATININE: 1.08 mg/dL (ref 0.50–1.35)
GFR, Est Non African American: 81 mL/min
GLUCOSE: 110 mg/dL — AB (ref 70–99)
Potassium: 4 mEq/L (ref 3.5–5.3)
Sodium: 142 mEq/L (ref 135–145)
TOTAL PROTEIN: 7.6 g/dL (ref 6.0–8.3)
Total Bilirubin: 1 mg/dL (ref 0.2–1.2)

## 2014-08-03 LAB — LIPID PANEL
Cholesterol: 162 mg/dL (ref 0–200)
HDL: 43 mg/dL (ref >39)
LDL CALC: 74 mg/dL (ref 0–99)
TRIGLYCERIDES: 227 mg/dL — AB (ref <150)
Total CHOL/HDL Ratio: 3.8 Ratio
VLDL: 45 mg/dL — ABNORMAL HIGH (ref 0–40)

## 2014-11-26 ENCOUNTER — Telehealth: Payer: Self-pay | Admitting: Family Medicine

## 2014-11-26 NOTE — Telephone Encounter (Signed)
Patient contacted office requesting note for his employer. 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 Pt states he takes his adderall during the day, this Rx is written by Dr. Sandria ManlyLove.  Is it ok to write this note? It will need to be faxed to employer at [phone]. Thank you.

## 2014-11-26 NOTE — Telephone Encounter (Signed)
Pt called again. He would like fax to be sent for the attention of Esaw Grandchildammy Booth. Thank you

## 2014-11-27 NOTE — Telephone Encounter (Signed)
OK to write letter? 

## 2014-11-27 NOTE — Telephone Encounter (Signed)
Patient called clinic again to check status on if note will be written.

## 2014-11-27 NOTE — Telephone Encounter (Signed)
Letter written, in box to be signed prior to being faxed.

## 2014-11-28 NOTE — Telephone Encounter (Signed)
Letter faxed. Pt notified via voicemail. Call back information provided.

## 2015-01-24 ENCOUNTER — Other Ambulatory Visit: Payer: Self-pay | Admitting: *Deleted

## 2015-01-24 MED ORDER — LISINOPRIL-HYDROCHLOROTHIAZIDE 20-25 MG PO TABS: 1.0000 | ORAL_TABLET | Freq: Every day | ORAL | Status: DC

## 2015-05-07 ENCOUNTER — Ambulatory Visit (INDEPENDENT_AMBULATORY_CARE_PROVIDER_SITE_OTHER): Payer: Self-pay | Admitting: Family Medicine

## 2015-05-07 ENCOUNTER — Encounter: Payer: Self-pay | Admitting: Family Medicine

## 2015-05-07 VITALS — BP 135/80 | HR 65 | Ht 75.0 in | Wt 214.0 lb

## 2015-05-07 DIAGNOSIS — R002 Palpitations: Secondary | ICD-10-CM

## 2015-05-07 DIAGNOSIS — R0609 Other forms of dyspnea: Secondary | ICD-10-CM | POA: Insufficient documentation

## 2015-05-07 MED ORDER — AMLODIPINE BESYLATE 10 MG PO TABS: 10.0000 mg | ORAL_TABLET | Freq: Every day | ORAL | Status: DC

## 2015-05-07 NOTE — Assessment & Plan Note (Signed)
Patient has fatigue with exertion and some lightheadedness. I think he may have more than one issue present today. I think predominantly he became orthostatic with exertion dehydration and Hydrocort Dyazide and lisinopril. He has lost a lot of weight and I think his blood pressure dose is probably too high. Will switch from lisinopril hydrochlorothiazide to amlodipine which is less likely to cause orthostatic hypotension with dehydration. Additionally his EKG is very slightly abnormal. Will refer to cardiology for a stress test. Return next week for blood pressure recheck. CMP and CBC pending. Work note provided.

## 2015-05-07 NOTE — Patient Instructions (Signed)
Thank you for coming in today. 1) STOP lisinopril/HCTZ.  2) Start amlodipine.  3) Follow up with cardiology.  4) Return next week for BP recheck.   Call or go to the emergency room if you get worse, have trouble breathing, have chest pains, or palpitations.   Near-Syncope Near-syncope (commonly known as near fainting) is sudden weakness, dizziness, or feeling like you might pass out. During an episode of near-syncope, you may also develop pale skin, have tunnel vision, or feel sick to your stomach (nauseous). Near-syncope may occur when getting up after sitting or while standing for a long time. It is caused by a sudden decrease in blood flow to the brain. This decrease can result from various causes or triggers, most of which are not serious. However, because near-syncope can sometimes be a sign of something serious, a medical evaluation is required. The specific cause is often not determined. HOME CARE INSTRUCTIONS  Monitor your condition for any changes. The following actions may help to alleviate any discomfort you are experiencing:  Have someone stay with you until you feel stable.  Lie down right away and prop your feet up if you start feeling like you might faint. Breathe deeply and steadily. Wait until all the symptoms have passed. Most of these episodes last only a few minutes. You may feel tired for several hours.   Drink enough fluids to keep your urine clear or pale yellow.   If you are taking blood pressure or heart medicine, get up slowly when seated or lying down. Take several minutes to sit and then stand. This can reduce dizziness.  Follow up with your health care provider as directed. SEEK IMMEDIATE MEDICAL CARE IF:   You have a severe headache.   You have unusual pain in the chest, abdomen, or back.   You are bleeding from the mouth or rectum, or you have black or tarry stool.   You have an irregular or very fast heartbeat.   You have repeated fainting or  have seizure-like jerking during an episode.   You faint when sitting or lying down.   You have confusion.   You have difficulty walking.   You have severe weakness.   You have vision problems.  MAKE SURE YOU:   Understand these instructions.  Will watch your condition.  Will get help right away if you are not doing well or get worse. Document Released: 08/17/2005 Document Revised: 08/22/2013 Document Reviewed: 01/20/2013 [facility] Patient Information 2015 [place], [place]. This information is not intended to replace advice given to you by your health care provider. Make sure you discuss any questions you have with your health care provider.

## 2015-05-07 NOTE — Progress Notes (Signed)
Gary Stevens is a 49 y.o. male who presents to [name]  today for fatigue. Patient is here for follow-up blood pressure. He notes he started a new job that requires significant outdoor exertion. He's been sweating excessively and feeling very lightheaded and having palpitations and having headaches. This is been worse with increased exertion. He denies any chest pains. He also denies any shortness of breath. He notes she's been taking his blood pressure medication as directed but has lost a considerable amount of weight since February 2015. He has lost over 60 pounds. This is intentional weight loss. His wife has been giving him some potassium tablets recently as he sweating a lot..   Past Medical History  Diagnosis Date  . KNEE PAIN 08/12/2009    Qualifier: Diagnosis of  By: Linford Arnold MD, Santina Evans    . INSOMNIA 06/06/2008    Qualifier: Diagnosis of  By: Linford Arnold MD, Santina Evans    . HYPERTROPHY PROSTATE W/UR OBST & OTH LUTS 01/15/2009    Qualifier: Diagnosis of  By: Linford Arnold MD, Santina Evans    . HYPERTRIGLYCERIDEMIA 08/12/2009    Qualifier: Diagnosis of  By: Linford Arnold MD, Santina Evans    . Essential hypertension, benign 06/06/2008    Qualifier: Diagnosis of  By: Linford Arnold MD, Santina Evans    . Anxiety state, unspecified 01/15/2009    Qualifier: Diagnosis of  By: Linford Arnold MD, Santina Evans    . ADHD 01/15/2009    Qualifier: Diagnosis of  By: Linford Arnold MD, Santina Evans     No past surgical history on file. Social History  Substance Use Topics  . Smoking status: Former Smoker    Quit date: 09/03/2012  . Smokeless tobacco: Not on file  . Alcohol Use: Not on file   family history includes Cirrhosis in his father; Diabetes Mellitus II in his father.  ROS as above Medications: Current Outpatient Prescriptions  Medication Sig Dispense Refill  . clonazePAM (KLONOPIN) 1 MG tablet Take 0.5-1 tablets (0.5-1 mg total) by mouth 3 (three) times daily as needed for anxiety. 90  tablet 3   No current facility-administered medications for this visit.   No Known Allergies   Exam:  BP 135/80 mmHg  Pulse 65  Ht  (1.905 m)  Wt 214 lb (97.07 kg)  BMI 26.75 kg/m2 Gen: Well NAD HEENT: EOMI,  MMM Lungs: Normal work of breathing. CTABL Heart: RRR no MRG Abd: NABS, Soft. Nondistended, Nontender Exts: Brisk capillary refill, warm and well perfused.   12-lead EKG: Normal sinus rhythm at 62 bpm. Small incomplete right bundle branch block present. Otherwise no abnormalities noted. No ST segment elevation or depression. Normal axis. QTC 406 QRS duration 96  No prior studies  No results found for this or any previous visit (from the past 24 hour(s)). No results found.   Please see individual assessment and plan sections.

## 2015-05-14 ENCOUNTER — Encounter: Payer: Self-pay | Admitting: Family Medicine

## 2015-05-14 ENCOUNTER — Ambulatory Visit (INDEPENDENT_AMBULATORY_CARE_PROVIDER_SITE_OTHER): Payer: Self-pay | Admitting: Family Medicine

## 2015-05-14 VITALS — BP 143/85 | HR 74 | Wt 219.0 lb

## 2015-05-14 DIAGNOSIS — I10 Essential (primary) hypertension: Secondary | ICD-10-CM

## 2015-05-14 DIAGNOSIS — R0609 Other forms of dyspnea: Secondary | ICD-10-CM

## 2015-05-14 NOTE — Assessment & Plan Note (Signed)
Follow-up with cardiology with stress test

## 2015-05-14 NOTE — Patient Instructions (Signed)
Thank you for coming in today. Continue amlodipine. Get labs today. Follow-up with cardiology in October. Return to clinic after the cardiology visit. Return to work. Come back sooner if needed. Call or go to the emergency room if you get worse, have trouble breathing, have chest pains, or palpitations.

## 2015-05-14 NOTE — Assessment & Plan Note (Signed)
Continue amlodipine. Recheck in a few weeks. CMP and CBC today.

## 2015-05-14 NOTE — Progress Notes (Signed)
Gary Stevens is a 49 y.o. male who presents to [name]  today for follow-up. Patient was seen on September 6 for palpitations and exertional dyspnea and fatigue. At this time I thought perhaps he had excessive antihypertension medication because of weight loss. His lisinopril/hydrochlorothiazide was discontinued and he was started on amlodipine. He states that he is feeling much better. He states that he has an appointment with cardiology in October. He would like to return to work full duty starting tomorrow. No current chest pains or palpitations. He states that he did not get blood work last time because he forgot.   Past Medical History  Diagnosis Date  . KNEE PAIN 08/12/2009    Qualifier: Diagnosis of  By: Linford Arnold MD, Santina Evans    . INSOMNIA 06/06/2008    Qualifier: Diagnosis of  By: Linford Arnold MD, Santina Evans    . HYPERTROPHY PROSTATE W/UR OBST & OTH LUTS 01/15/2009    Qualifier: Diagnosis of  By: Linford Arnold MD, Santina Evans    . HYPERTRIGLYCERIDEMIA 08/12/2009    Qualifier: Diagnosis of  By: Linford Arnold MD, Santina Evans    . Essential hypertension, benign 06/06/2008    Qualifier: Diagnosis of  By: Linford Arnold MD, Santina Evans    . Anxiety state, unspecified 01/15/2009    Qualifier: Diagnosis of  By: Linford Arnold MD, Santina Evans    . ADHD 01/15/2009    Qualifier: Diagnosis of  By: Linford Arnold MD, Santina Evans     No past surgical history on file. Social History  Substance Use Topics  . Smoking status: Former Smoker    Quit date: 09/03/2012  . Smokeless tobacco: Not on file  . Alcohol Use: Not on file   family history includes Cirrhosis in his father; Diabetes Mellitus II in his father.  ROS as above Medications: Current Outpatient Prescriptions  Medication Sig Dispense Refill  . amLODipine (NORVASC) 10 MG tablet Take 1 tablet (10 mg total) by mouth daily. 30 tablet 2  . clonazePAM (KLONOPIN) 1 MG tablet Take 0.5-1 tablets (0.5-1 mg total) by mouth 3 (three) times daily  as needed for anxiety. 90 tablet 3   No current facility-administered medications for this visit.   No Known Allergies   Exam:  BP 143/85 mmHg  Pulse 74  Wt 219 lb (99.338 kg) Gen: Well NAD HEENT: EOMI,  MMM Lungs: Normal work of breathing. CTABL Heart: RRR no MRG Abd: NABS, Soft. Nondistended, Nontender Exts: Brisk capillary refill, warm and well perfused.   No results found for this or any previous visit (from the past 24 hour(s)). No results found.   Please see individual assessment and plan sections.

## 2015-06-03 ENCOUNTER — Ambulatory Visit: Payer: Self-pay | Admitting: Cardiology

## 2015-06-05 ENCOUNTER — Encounter: Payer: Self-pay | Admitting: Cardiology

## 2015-06-10 ENCOUNTER — Encounter: Payer: Self-pay | Admitting: Cardiology

## 2015-07-05 ENCOUNTER — Ambulatory Visit: Payer: Self-pay | Admitting: Family Medicine

## 2015-07-05 ENCOUNTER — Telehealth: Payer: Self-pay

## 2015-07-05 MED ORDER — LISINOPRIL 40 MG PO TABS: 40.0000 mg | ORAL_TABLET | Freq: Every day | ORAL | Status: DC

## 2015-07-05 NOTE — Telephone Encounter (Signed)
New prescriptions sent with lisinopril. I did not add the hydrochlorothiazide component since it look like he had a event that was felt to be secondary to possibly mild dehydration.

## 2015-07-05 NOTE — Telephone Encounter (Signed)
Patient wants to switch back to Lisinopril. He does not have insurance at the moment and does not want to come in. He states the Lisinopril worked better than the Amlodipine. Please advise.

## 2015-07-08 NOTE — Telephone Encounter (Signed)
Left message advising of recommendations.  

## 2015-11-19 ENCOUNTER — Other Ambulatory Visit: Payer: Self-pay | Admitting: Family Medicine

## 2015-12-05 ENCOUNTER — Telehealth: Payer: Self-pay | Admitting: Family Medicine

## 2015-12-05 NOTE — Telephone Encounter (Signed)
I called pt and left a message stating that he needs to call the office to schedule a BP f/u with Dr.Metheney

## 2016-01-01 ENCOUNTER — Encounter: Payer: Self-pay | Admitting: Family Medicine

## 2016-01-01 ENCOUNTER — Ambulatory Visit (INDEPENDENT_AMBULATORY_CARE_PROVIDER_SITE_OTHER): Payer: Self-pay | Admitting: Family Medicine

## 2016-01-01 VITALS — BP 160/85 | HR 71 | Wt 212.0 lb

## 2016-01-01 DIAGNOSIS — F4310 Post-traumatic stress disorder, unspecified: Secondary | ICD-10-CM

## 2016-01-01 DIAGNOSIS — I10 Essential (primary) hypertension: Secondary | ICD-10-CM

## 2016-01-01 LAB — POCT GLYCOSYLATED HEMOGLOBIN (HGB A1C): Hemoglobin A1C: 5.2

## 2016-01-01 MED ORDER — LISINOPRIL 40 MG PO TABS: 40.0000 mg | ORAL_TABLET | Freq: Every day | ORAL | Status: DC

## 2016-01-01 NOTE — Progress Notes (Signed)
   Subjective:    Patient ID: Gary Stevens, male    DOB: 02-06-1966, 50 y.o.   MRN: [id number]  HPI Hypertension, 6 mo f/u- Pt denies chest pain, SOB, dizziness, or heart palpitations.  Taking meds as directed w/o problems.  Denies medication side effects.  He is out of medication and has been for a week.  He has lost almost 90 pounds by trying to eat more healthy and really work on his weight. He is now down to 212 pounds and would like to get down to 210 pounds.  PTSD - he is followed by Dr. love to write his Adderall and his clonazepam. Unfortunately last summer he lost his job. He applied for a DOT physical and says that he put on the medications that he was taking and it did show up on the urine drug screen. He then got C from os and from Dr. love saying that he was prescribed these medications. Unfortunately they still fired him. He has now got a job with a new company after working with a [employer] research [employer] to get the urine drug screen removed from his record.    Review of Systems     Objective:   Physical Exam  Constitutional: He is oriented to person, place, and time. He appears well-developed and well-nourished.  HENT:  Head: Normocephalic and atraumatic.  Cardiovascular: Normal rate, regular rhythm and normal heart sounds.   Pulmonary/Chest: Effort normal and breath sounds normal.  Neurological: He is alert and oriented to person, place, and time.  Skin: Skin is warm and dry.  Psychiatric: He has a normal mood and affect. His behavior is normal.       Assessment & Plan:  HTN - Uncontrolled today but he's been off his medication from his 4 days. New prescription sent to the pharmacy for the next 6 months. Encouraged him to get back on. He really is due for blood work as well but won't have active insurance for 90 days so I encouraged him to hold onto the lab slip and then go wants his insurance is activated.  PTSD-following with psychiatry currently on Adderall and  clonazepam.  Screening for diabetes. Because of age greater than 40, history of hypertension, and history of obesity recommend screening for diabetes.

## 2016-01-14 ENCOUNTER — Ambulatory Visit: Payer: Self-pay | Admitting: Family Medicine

## 2016-06-26 ENCOUNTER — Other Ambulatory Visit: Payer: Self-pay

## 2016-06-26 MED ORDER — LISINOPRIL 40 MG PO TABS: 40.0000 mg | ORAL_TABLET | Freq: Every day | ORAL | 0 refills | Status: DC

## 2016-12-14 ENCOUNTER — Ambulatory Visit (INDEPENDENT_AMBULATORY_CARE_PROVIDER_SITE_OTHER): Payer: Self-pay | Admitting: Family Medicine

## 2016-12-14 ENCOUNTER — Encounter: Payer: Self-pay | Admitting: Family Medicine

## 2016-12-14 VITALS — BP 202/118 | HR 74 | Ht 75.0 in | Wt 230.0 lb

## 2016-12-14 DIAGNOSIS — I10 Essential (primary) hypertension: Secondary | ICD-10-CM

## 2016-12-14 MED ORDER — LISINOPRIL-HYDROCHLOROTHIAZIDE 20-25 MG PO TABS: 1.0000 | ORAL_TABLET | Freq: Every day | ORAL | 1 refills | Status: DC

## 2016-12-14 NOTE — Progress Notes (Signed)
Subjective:    CC: HTN, Anxiety  HPI:  Hypertension- Pt denies chest pain, SOB, dizziness, or heart palpitations.  Taking meds as directed w/o problems.  Denies medication side effects.  Has been having more frequent headaches recently.  He is no longer on clonazepam and has not been using his Adderall since he has been out of his blood pressure medicine.  Past medical history, Surgical history, Family history not pertinant except as noted below, Social history, Allergies, and medications have been entered into the medical record, reviewed, and corrections made.   Review of Systems: No fevers, chills, night sweats, weight loss, chest pain, or shortness of breath.   Objective:    General: Well Developed, well nourished, and in no acute distress.  Neuro: Alert and oriented x3, extra-ocular muscles intact, sensation grossly intact.  HEENT: Normocephalic, atraumatic  Skin: Warm and dry, no rashes. Cardiac: Regular rate and rhythm, no murmurs rubs or gallops, no lower extremity edema.  Respiratory: Clear to auscultation bilaterally. Not using accessory muscles, speaking in full sentences.   Impression and Recommendations:    HTN - Uncontrolled.  No sign of HTN emergency, asymptomatic today.  He would like to get back on the blood pressure pill with a diuretic. Restart with Cipro HCT. Follow-up in 4 weeks. By then he'll have health insurance and we can do some additional evaluation as well as some blood work.

## 2017-01-11 ENCOUNTER — Ambulatory Visit (INDEPENDENT_AMBULATORY_CARE_PROVIDER_SITE_OTHER): Payer: Managed Care, Other (non HMO) | Admitting: Family Medicine

## 2017-01-11 ENCOUNTER — Encounter: Payer: Self-pay | Admitting: Family Medicine

## 2017-01-11 VITALS — BP 145/88 | HR 61 | Ht 75.0 in | Wt 228.0 lb

## 2017-01-11 DIAGNOSIS — I10 Essential (primary) hypertension: Secondary | ICD-10-CM | POA: Diagnosis not present

## 2017-01-11 DIAGNOSIS — Z125 Encounter for screening for malignant neoplasm of prostate: Secondary | ICD-10-CM

## 2017-01-11 MED ORDER — METOPROLOL SUCCINATE ER 25 MG PO TB24: 25.0000 mg | ORAL_TABLET | Freq: Every day | ORAL | 1 refills | Status: DC

## 2017-01-11 NOTE — Progress Notes (Addendum)
Subjective:    CC: HTN  HPI: Hypertension- Pt denies chest pain, SOB, dizziness, or heart palpitations.  Taking meds as directed w/o problems.  Denies medication side effects. He says he really watches his diet and tries to avoid excess salt. He says his headaches are much better now that he is back on his blood pressure medication.   Discussed need for prostate ca screening.   Past medical history, Surgical history, Family history not pertinant except as noted below, Social history, Allergies, and medications have been entered into the medical record, reviewed, and corrections made.   Review of Systems: No fevers, chills, night sweats, weight loss, chest pain, or shortness of breath.   Objective:    General: Well Developed, well nourished, and in no acute distress.  Neuro: Alert and oriented x3, extra-ocular muscles intact, sensation grossly intact.  HEENT: Normocephalic, atraumatic  Skin: Warm and dry, no rashes. Cardiac: Regular rate and rhythm, no murmurs rubs or gallops, no lower extremity edema.  Respiratory: Clear to auscultation bilaterally. Not using accessory muscles, speaking in full sentences.   Impression and Recommendations:   HTN - Much improved now it's back with medication. Tolerating it well. Due for CMP and lipid panel. Lab metoprolol looks and release 25 mg daily. Follow-up for nurse visit in 2 weeks. If at goal at that point and I will see him back in 6 months.  Discussed need for colon cancer screening as well. Given information about colonoscopy and ColoGuard.  Due for screening PSA

## 2017-01-12 LAB — COMPLETE METABOLIC PANEL WITH GFR
ALBUMIN: 4.8 g/dL (ref 3.6–5.1)
ALK PHOS: 108 U/L (ref 40–115)
ALT: 32 U/L (ref 9–46)
AST: 36 U/L — ABNORMAL HIGH (ref 10–35)
BILIRUBIN TOTAL: 1.2 mg/dL (ref 0.2–1.2)
BUN: 13 mg/dL (ref 7–25)
CO2: 23 mmol/L (ref 20–31)
CREATININE: 1.28 mg/dL (ref 0.70–1.33)
Calcium: 9.9 mg/dL (ref 8.6–10.3)
Chloride: 101 mmol/L (ref 98–110)
GFR, EST NON AFRICAN AMERICAN: 64 mL/min (ref >=60)
GFR, Est African American: 74 mL/min (ref >=60)
GLUCOSE: 151 mg/dL — AB (ref 65–99)
Potassium: 3.8 mmol/L (ref 3.5–5.3)
SODIUM: 142 mmol/L (ref 135–146)
TOTAL PROTEIN: 7.6 g/dL (ref 6.1–8.1)

## 2017-01-12 LAB — LIPID PANEL W/REFLEX DIRECT LDL
Cholesterol: 175 mg/dL (ref <200)
HDL: 46 mg/dL (ref >40)
LDL-CHOLESTEROL: 96 mg/dL
Non-HDL Cholesterol (Calc): 129 mg/dL (ref <130)
TRIGLYCERIDES: 251 mg/dL — AB (ref <150)
Total CHOL/HDL Ratio: 3.8 Ratio (ref <5.0)

## 2017-01-12 LAB — PSA: PSA: 1.5 ng/mL (ref <=4.0)

## 2017-01-26 ENCOUNTER — Ambulatory Visit (INDEPENDENT_AMBULATORY_CARE_PROVIDER_SITE_OTHER): Payer: Managed Care, Other (non HMO) | Admitting: Family Medicine

## 2017-01-26 VITALS — BP 136/83

## 2017-01-26 DIAGNOSIS — I10 Essential (primary) hypertension: Secondary | ICD-10-CM

## 2017-01-26 NOTE — Progress Notes (Signed)
Blood pressure was elevated when he was here couple weeks ago started as to come back in for nurse visit. Blood pressure looks fantastic today and is at goal. Keep regular follow-up scheduled for 6 months.

## 2017-01-26 NOTE — Progress Notes (Signed)
Pt here for BP check, pt reports no problems. 136/83

## 2017-04-01 ENCOUNTER — Telehealth: Payer: Self-pay | Admitting: *Deleted

## 2017-04-01 MED ORDER — OMEPRAZOLE 20 MG PO CPDR: 20.0000 mg | DELAYED_RELEASE_CAPSULE | Freq: Every day | ORAL | 3 refills | Status: DC

## 2017-04-01 NOTE — Telephone Encounter (Signed)
Sent!

## 2017-04-01 NOTE — Telephone Encounter (Signed)
Patient wants to know if he can get a prescription for Prilosec. He states he takes the over the counter version but it would be cheaper through insurance. He is changing insurance starting Monday due to job change and doesn't want to come in for an appointment at this time. He takes the prilosec for GERD. Please advise

## 2017-05-30 ENCOUNTER — Other Ambulatory Visit: Payer: Self-pay | Admitting: Family Medicine

## 2017-06-10 ENCOUNTER — Encounter: Payer: Self-pay | Admitting: Family Medicine

## 2017-06-10 ENCOUNTER — Ambulatory Visit (INDEPENDENT_AMBULATORY_CARE_PROVIDER_SITE_OTHER): Payer: BLUE CROSS/BLUE SHIELD | Admitting: Family Medicine

## 2017-06-10 VITALS — BP 116/69 | HR 76 | Ht 75.0 in | Wt 247.0 lb

## 2017-06-10 DIAGNOSIS — K219 Gastro-esophageal reflux disease without esophagitis: Secondary | ICD-10-CM | POA: Diagnosis not present

## 2017-06-10 DIAGNOSIS — I10 Essential (primary) hypertension: Secondary | ICD-10-CM | POA: Diagnosis not present

## 2017-06-10 DIAGNOSIS — R7301 Impaired fasting glucose: Secondary | ICD-10-CM

## 2017-06-10 LAB — POCT GLYCOSYLATED HEMOGLOBIN (HGB A1C): Hemoglobin A1C: 6

## 2017-06-10 MED ORDER — PANTOPRAZOLE SODIUM 20 MG PO TBEC: 20.0000 mg | DELAYED_RELEASE_TABLET | Freq: Every day | ORAL | 3 refills | Status: DC

## 2017-06-10 MED ORDER — METOPROLOL SUCCINATE ER 25 MG PO TB24: 25.0000 mg | ORAL_TABLET | Freq: Every day | ORAL | 1 refills | Status: DC

## 2017-06-10 MED ORDER — LISINOPRIL-HYDROCHLOROTHIAZIDE 20-25 MG PO TABS: 1.0000 | ORAL_TABLET | Freq: Every day | ORAL | 1 refills | Status: DC

## 2017-06-10 NOTE — Progress Notes (Signed)
Subjective:    CC: BP, reflux  HPI:  Hypertension- Pt denies chest pain, SOB, dizziness, or heart palpitations.  Taking meds as directed w/o problems.  Denies medication side effects.    GERD - Prior history of bleeding ulcer. He takes his PPI regularly. He checked with his insurance and it looks like per tonics will be cheaper that he just filled a perception for omeprazole. He would like the next prescription to be changed.  Abnormal glucose - Lahey is had abnormal glucose levels. Last him 11 A1c about 2 years ago was 5.2. 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 Has been eating out a lot. That he should be getting a new tract that will actually have a refrigerator in a microwave  Past medical history, Surgical history, Family history not pertinant except as noted below, Social history, Allergies, and medications have been entered into the medical record, reviewed, and corrections made.   Review of Systems: No fevers, chills, night sweats, weight loss, chest pain, or shortness of breath.   Objective:    General: Well Developed, well nourished, and in no acute distress.  Neuro: Alert and oriented x3, extra-ocular muscles intact, sensation grossly intact.  HEENT: Normocephalic, atraumatic  Skin: Warm and dry, no rashes. Cardiac: Regular rate and rhythm, no murmurs rubs or gallops, no lower extremity edema.  Respiratory: Clear to auscultation bilaterally. Not using accessory muscles, speaking in full sentences.   Impression and Recommendations:    HTN - Well controlled. Continue current regimen. Follow up in  6 months .   GERD - Will change to per tonics to see if it's cheaper with his new plan.  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 Discussed dietary recommendations including low sugar low carb diet. Increasing regular exercise encouraged him to lose about 8 pounds between now when I see him back in 4 months.

## 2017-06-10 NOTE — Addendum Note (Signed)
Addended by: Nani Gasser D on: 06/10/2017 12:03 PM   Modules accepted: Orders

## 2017-07-01 ENCOUNTER — Other Ambulatory Visit: Payer: Self-pay | Admitting: *Deleted

## 2017-07-01 MED ORDER — PANTOPRAZOLE SODIUM 20 MG PO TBEC: 20.0000 mg | DELAYED_RELEASE_TABLET | Freq: Every day | ORAL | 3 refills | Status: DC

## 2017-07-01 NOTE — Progress Notes (Signed)
Patient wanted protonix sent to [facility] instead of [facility]

## 2017-07-09 ENCOUNTER — Other Ambulatory Visit: Payer: Self-pay | Admitting: Emergency Medicine

## 2017-09-03 ENCOUNTER — Other Ambulatory Visit: Payer: Self-pay | Admitting: Family Medicine

## 2017-10-06 ENCOUNTER — Other Ambulatory Visit: Payer: Self-pay | Admitting: Family Medicine

## 2017-10-08 ENCOUNTER — Other Ambulatory Visit: Payer: Self-pay | Admitting: Physician Assistant

## 2017-10-08 ENCOUNTER — Ambulatory Visit (INDEPENDENT_AMBULATORY_CARE_PROVIDER_SITE_OTHER): Payer: BLUE CROSS/BLUE SHIELD | Admitting: Physician Assistant

## 2017-10-08 ENCOUNTER — Ambulatory Visit (INDEPENDENT_AMBULATORY_CARE_PROVIDER_SITE_OTHER): Payer: BLUE CROSS/BLUE SHIELD

## 2017-10-08 VITALS — BP 136/88 | HR 80 | Temp 98.3°F | Wt 262.0 lb

## 2017-10-08 DIAGNOSIS — N5089 Other specified disorders of the male genital organs: Secondary | ICD-10-CM

## 2017-10-08 DIAGNOSIS — R361 Hematospermia: Secondary | ICD-10-CM | POA: Diagnosis not present

## 2017-10-08 DIAGNOSIS — N433 Hydrocele, unspecified: Secondary | ICD-10-CM | POA: Diagnosis not present

## 2017-10-08 DIAGNOSIS — I10 Essential (primary) hypertension: Secondary | ICD-10-CM | POA: Diagnosis not present

## 2017-10-08 DIAGNOSIS — N401 Enlarged prostate with lower urinary tract symptoms: Secondary | ICD-10-CM

## 2017-10-08 MED ORDER — CIPROFLOXACIN HCL 750 MG PO TABS: 750.0000 mg | ORAL_TABLET | Freq: Two times a day (BID) | ORAL | 0 refills | Status: AC

## 2017-10-08 MED ORDER — TAMSULOSIN HCL 0.4 MG PO CAPS: 0.4000 mg | ORAL_CAPSULE | Freq: Every day | ORAL | 5 refills | Status: DC

## 2017-10-08 NOTE — Patient Instructions (Signed)
Benign Prostatic Hyperplasia Benign prostatic hyperplasia (BPH) is an enlarged prostate gland that is caused by the normal aging process and not by cancer. The prostate is a walnut-sized gland that is involved in the production of semen. It is located in front of the rectum and below the bladder. The bladder stores urine and the urethra is the tube that carries the urine out of the body. The prostate may get bigger as a man gets older. An enlarged prostate can press on the urethra. This can make it harder to pass urine. The build-up of urine in the bladder can cause infection. Back pressure and infection may progress to bladder damage and kidney (renal) failure. What are the causes? This condition is part of a normal aging process. However, not all men develop problems from this condition. If the prostate enlarges away from the urethra, urine flow will not be blocked. If it enlarges toward the urethra and compresses it, there will be problems passing urine. What increases the risk? This condition is more likely to develop in men over the age of 50 years. What are the signs or symptoms? Symptoms of this condition include:  Getting up often during the night to urinate.  Needing to urinate frequently during the day.  Difficulty starting urine flow.  Decrease in size and strength of your urine stream.  Leaking (dribbling) after urinating.  Inability to pass urine. This needs immediate treatment.  Inability to completely empty your bladder.  Pain when you pass urine. This is more common if there is also an infection.  Urinary tract infection (UTI).  How is this diagnosed? This condition is diagnosed based on your medical history, a physical exam, and your symptoms. Tests will also be done, such as:  A post-void bladder scan. This measures any amount of urine that may remain in your bladder after you finish urinating.  A digital rectal exam. In a rectal exam, your health care provider  checks your prostate by putting a lubricated, gloved finger into your rectum to feel the back of your prostate gland. This exam detects the size of your gland and any abnormal lumps or growths.  An exam of your urine (urinalysis).  A prostate specific antigen (PSA) screening. This is a blood test used to screen for prostate cancer.  An ultrasound. This test uses sound waves to electronically produce a picture of your prostate gland.  Your health care provider may refer you to a specialist in kidney and prostate diseases (urologist). How is this treated? Once symptoms begin, your health care provider will monitor your condition (active surveillance or watchful waiting). Treatment for this condition will depend on the severity of your condition. Treatment may include:  Observation and yearly exams. This may be the only treatment needed if your condition and symptoms are mild.  Medicines to relieve your symptoms, including: ? Medicines to shrink the prostate. ? Medicines to relax the muscle of the prostate.  Surgery in severe cases. Surgery may include: ? Prostatectomy. In this procedure, the prostate tissue is removed completely through an open incision or with a laparascope or robotics. ? Transurethral resection of the prostate (TURP). In this procedure, a tool is inserted through the opening at the tip of the penis (urethra). It is used to cut away tissue of the inner core of the prostate. The pieces are removed through the same opening of the penis. This removes the blockage. ? Transurethral incision (TUIP). In this procedure, small cuts are made in the prostate. This  lessens the prostate's pressure on the urethra. ? Transurethral microwave thermotherapy (TUMT). This procedure uses microwaves to create heat. The heat destroys and removes a small amount of prostate tissue. ? Transurethral needle ablation (TUNA). This procedure uses radio frequencies to destroy and remove a small amount of  prostate tissue. ? Interstitial laser coagulation (ILC). This procedure uses a laser to destroy and remove a small amount of prostate tissue. ? Transurethral electrovaporization (TUVP). This procedure uses electrodes to destroy and remove a small amount of prostate tissue. ? Prostatic urethral lift. This procedure inserts an implant to push the lobes of the prostate away from the urethra.  Follow these instructions at home:  Take over-the-counter and prescription medicines only as told by your health care provider.  Monitor your symptoms for any changes. Contact your health care provider with any changes.  Avoid drinking large amounts of liquid before going to bed or out in public.  Avoid or reduce how much caffeine or alcohol you drink.  Give yourself time when you urinate.  Keep all follow-up visits as told by your health care provider. This is important. Contact a health care provider if:  You have unexplained back pain.  Your symptoms do not get better with treatment.  You develop side effects from the medicine you are taking.  Your urine becomes very dark or has a bad smell.  Your lower abdomen becomes distended and you have trouble passing your urine. Get help right away if:  You have a fever or chills.  You suddenly cannot urinate.  You feel lightheaded, or very dizzy, or you faint.  There are large amounts of blood or clots in the urine.  Your urinary problems become hard to manage.  You develop moderate to severe low back or flank pain. The flank is the side of your body between the ribs and the hip. These symptoms may represent a serious problem that is an emergency. Do not wait to see if the symptoms will go away. Get medical help right away. Call your local emergency services (911 in the [place] Do not drive yourself to the hospital. Summary  Benign prostatic hyperplasia (BPH) is an enlarged prostate that is caused by the normal aging process and not by  cancer.  An enlarged prostate can press on the urethra. This can make it hard to pass urine.  This condition is part of a normal aging process and is more likely to develop in men over the age of 50 years.  Get help right away if you suddenly cannot urinate. This information is not intended to replace advice given to you by your health care provider. Make sure you discuss any questions you have with your health care provider. Document Released: 08/17/2005 Document Revised: 09/21/2016 Document Reviewed: 09/21/2016 Elsevier Interactive Patient Education  2018 [employer]  Scrotal Swelling Scrotal swelling may occur on one or both sides of the scrotum. Pain may also occur with swelling. Possible causes of scrotal swelling include:  Injury.  Infection.  An ingrown hair or abrasion in the area.  Repeated rubbing from tight-fitting underwear.  Poor hygiene.  A weakened area in the muscles around the groin (hernia). A hernia can allow abdominal contents to push into the scrotum.  Fluid around the testicle (hydrocele).  Enlarged vein around the testicle (varicocele).  Certain medical treatments or existing conditions.  A recent genital surgery or procedure.  The spermatic cord becomes twisted in the scrotum, which cuts off blood supply (testicular torsion).  Testicular cancer.  Follow these instructions at home: Once the cause of your scrotal swelling has been determined, you may be asked to monitor your scrotum for any changes. The following actions may help to alleviate any discomfort you are experiencing:  Rest and limit activity until the swelling goes away. Lying down is the preferred position.  Put ice on the scrotum: ? Put ice in a plastic bag. ? Place a towel between your skin and the bag. ? Leave the ice on for 20 minutes, 2-3 times a day for 1-2 days.  Place a rolled towel under the testicles for support.  Wear loose-fitting clothing or an athletic support cup  for comfort.  Take all medicines as directed by your health care provider.  Perform a monthly self-exam of the scrotum and penis. Feel for changes. Ask your health care provider how to perform a monthly self-exam if you are unsure.  Contact a health care provider if:  You have a sudden (acute) onset of pain that is persistent and not improving.  You notice a heavy feeling or fluid in the scrotum.  You have pain or burning while urinating.  You have blood in the urine or semen.  You feel a lump around the testicle.  You notice that one testicle is larger than the other (slight variation is normal).  You have a persistent dull ache or pain in the groin or scrotum. Get help right away if:  The pain does not go away or becomes severe.  You have a fever or shaking chills.  You have pain or vomiting that cannot be controlled.  You notice significant redness or swelling of one or both sides of the scrotum.  You experience redness spreading upward from your scrotum to your abdomen or downward from your scrotum to your thighs. This information is not intended to replace advice given to you by your health care provider. Make sure you discuss any questions you have with your health care provider. Document Released: 09/19/2010 Document Revised: 03/06/2016 Document Reviewed: 01/19/2013 Elsevier Interactive Patient Education  [employer].

## 2017-10-08 NOTE — Progress Notes (Signed)
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 Treatment will start with scrotal support. You can get this at any medical supply store. If still having significant pain after 4 weeks, will refer you to urology to discuss surgical options

## 2017-10-08 NOTE — Progress Notes (Signed)
HPI:                                                                Gary Stevens is a 52 y.o. male who [facility] today for hematospermia  Pleasant 52 yo [place] [address] with PMH of HTN, BPH w/LUTS, hernia s/p repair, and GERD presents today for hematospermia. States he had 1 episode of hematospermia on August 25, 2017. He has avoided sexual activity since that time. Reports he has also been having RLQ abdominal pain radiating to his right testicle. Reports right testicle feels swollen. He has intermittent rectal pain. He does endorse weak urinary stream, post-void dribbling, and incomplete bladder emptying. Denies fever, chills, flank pain, hematuria, dysuria, penile discharge.  IPSS Questionnaire (AUA-7): Over the past month.   1)  How often have you had a sensation of not emptying your bladder completely after you finish urinating?  2 - Less than half the time  2)  How often have you had to urinate again less than two hours after you finished urinating? 1 - Less than 1 time in 5  3)  How often have you found you stopped and started again several times when you urinated?  1 - Less than 1 time in 5  4) How difficult have you found it to postpone urination?  3 - About half the time  5) How often have you had a weak urinary stream?  3 - About half the time  6) How often have you had to push or strain to begin urination?  1 - Less than 1 time in 5  7) How many times did you most typically get up to urinate from the time you went to bed until the time you got up in the morning?  1 - 1 time  Total score:  0-7 mildly symptomatic   8-19 moderately symptomatic   20-35 severely symptomatic    Depression screen PHQ 2/9 06/10/2017  Decreased Interest 0  Down, Depressed, Hopeless 0  PHQ - 2 Score 0    No flowsheet data found.    Past Medical History:  Diagnosis Date  . ADHD 01/15/2009   Qualifier: Diagnosis of  By:  Linford ArnoldMetheney MD, Santina Evansatherine    . Anxiety state, unspecified 01/15/2009   Qualifier: Diagnosis of  By: Linford ArnoldMetheney MD, Santina Evansatherine    . Essential hypertension, benign 06/06/2008   Qualifier: Diagnosis of  By: Linford ArnoldMetheney MD, Santina Evansatherine    . HYPERTRIGLYCERIDEMIA 08/12/2009   Qualifier: Diagnosis of  By: Linford ArnoldMetheney MD, Santina Evansatherine    . HYPERTROPHY PROSTATE W/UR OBST & OTH LUTS 01/15/2009   Qualifier: Diagnosis of  By: Linford ArnoldMetheney MD, Santina Evansatherine    . INSOMNIA 06/06/2008   Qualifier: Diagnosis of  By: Linford ArnoldMetheney MD, Santina Evansatherine    . KNEE PAIN 08/12/2009   Qualifier: Diagnosis of  By: Linford ArnoldMetheney MD, Santina Evansatherine     No past surgical history on file. Social History   Tobacco Use  . Smoking status: Former Smoker    Last attempt to quit: 09/03/2012    Years since quitting: 5.0  . Smokeless tobacco: Never Used  Substance Use Topics  . Alcohol use: Not on file   family history includes Cirrhosis in his father; Diabetes Mellitus  II in his father.    ROS: negative except as noted in the HPI  Medications: Current Outpatient Medications  Medication Sig Dispense Refill  . amphetamine-dextroamphetamine (ADDERALL) 20 MG tablet Take 20 mg by mouth.  0  . lisinopril-hydrochlorothiazide (PRINZIDE,ZESTORETIC) 20-25 MG tablet TAKE 1 TABLET BY MOUTH DAILY 30 tablet 0  . metoprolol succinate (TOPROL-XL) 25 MG 24 hr tablet Take 1 tablet (25 mg total) by mouth daily. 90 tablet 1  . pantoprazole (PROTONIX) 20 MG tablet Take 1 tablet (20 mg total) by mouth daily. 90 tablet 3  . QUEtiapine (SEROQUEL) 50 MG tablet Take 50 mg by mouth.  4  . ciprofloxacin (CIPRO) 750 MG tablet Take 1 tablet (750 mg total) by mouth 2 (two) times daily for 14 days. 28 tablet 0  . tamsulosin (FLOMAX) 0.4 MG CAPS capsule Take 1 capsule (0.4 mg total) by mouth daily after supper. 30 capsule 5   No current facility-administered medications for this visit.    Allergies  Allergen Reactions  . Mirtazapine     Other reaction(s): Unknown       Objective:  BP  136/88   Pulse 86   Temp 98.3 F (36.8 C) (Oral)   Wt 262 lb (118.8 kg)   BMI 32.75 kg/m  Gen:  alert, not ill-appearing, no distress, appropriate for age HEENT: head normocephalic without obvious abnormality, conjunctiva and cornea clear, trachea midline Pulm: Normal work of breathing, normal phonation GI: abdomen soft, nontender, no CVA tenderness GU: right scrotum visibly swollen with palpable fullness, scrotum diffusely tender, no palpable testicular mass, no inguinal hernia Neuro: alert and oriented x 3, no tremor MSK: extremities atraumatic, normal gait and station Skin: intact, no rashes on exposed skin, no jaundice, no cyanosis   GU exam performed by Rodney Langton, MD. Chaperoned by myself.  No results found for this or any previous visit (from the past 72 hour(s)). No results found.    Assessment and Plan: 52 y.o. male with  1. Hematospermia - he has a history of prostatitis and BPH. Will treat empirically for acute prostatitis. UA and urine culture pending. - ciprofloxacin (CIPRO) 750 MG tablet; Take 1 tablet (750 mg total) by mouth 2 (two) times daily for 14 days.  Dispense: 28 tablet; Refill: 0  2. Benign prostatic hyperplasia with lower urinary tract symptoms, symptom details unspecified - AUASS 12, moderate, QOL is terrible. Starting Flomax. Treating empirically for prostatitis - ciprofloxacin (CIPRO) 750 MG tablet; Take 1 tablet (750 mg total) by mouth 2 (two) times daily for 14 days.  Dispense: 28 tablet; Refill: 0 - Urinalysis, Routine w reflex microscopic - Urine Culture  3. Swelling of right testicle - felt to be hydrocele or varicocele on exam, no palpable mass. [place] to confirm. Discussed treatment to include scrotal support - US SCROTUM; Future  4. Elevated blood pressure reading with diagnosis of hypertension BP Readings from Last 3 Encounters:  10/08/17 136/88  06/10/17 116/69  01/26/17 136/83  - recheck BP improved   Patient education and  anticipatory guidance given Patient agrees with treatment plan Follow-up in 2 weeks or sooner as needed if symptoms worsen or fail to improve  Levonne Hubert PA-C

## 2017-10-10 ENCOUNTER — Encounter: Payer: Self-pay | Admitting: Physician Assistant

## 2017-10-10 DIAGNOSIS — I10 Essential (primary) hypertension: Secondary | ICD-10-CM | POA: Insufficient documentation

## 2017-10-10 DIAGNOSIS — N5089 Other specified disorders of the male genital organs: Secondary | ICD-10-CM | POA: Insufficient documentation

## 2017-10-10 DIAGNOSIS — R361 Hematospermia: Secondary | ICD-10-CM | POA: Insufficient documentation

## 2017-10-11 ENCOUNTER — Ambulatory Visit: Payer: BLUE CROSS/BLUE SHIELD | Admitting: Family Medicine

## 2017-11-18 ENCOUNTER — Encounter: Payer: Self-pay | Admitting: Family Medicine

## 2017-11-18 ENCOUNTER — Ambulatory Visit: Payer: BLUE CROSS/BLUE SHIELD | Admitting: Family Medicine

## 2017-11-18 VITALS — BP 156/84 | HR 93 | Ht 75.0 in | Wt 264.0 lb

## 2017-11-18 DIAGNOSIS — K279 Peptic ulcer, site unspecified, unspecified as acute or chronic, without hemorrhage or perforation: Secondary | ICD-10-CM | POA: Diagnosis not present

## 2017-11-18 DIAGNOSIS — I10 Essential (primary) hypertension: Secondary | ICD-10-CM

## 2017-11-18 DIAGNOSIS — Z125 Encounter for screening for malignant neoplasm of prostate: Secondary | ICD-10-CM

## 2017-11-18 DIAGNOSIS — R7309 Other abnormal glucose: Secondary | ICD-10-CM

## 2017-11-18 DIAGNOSIS — R7301 Impaired fasting glucose: Secondary | ICD-10-CM | POA: Diagnosis not present

## 2017-11-18 DIAGNOSIS — Z23 Encounter for immunization: Secondary | ICD-10-CM | POA: Diagnosis not present

## 2017-11-18 LAB — POCT GLYCOSYLATED HEMOGLOBIN (HGB A1C): Hemoglobin A1C: 6.1

## 2017-11-18 MED ORDER — METFORMIN HCL ER 500 MG PO TB24: 500.0000 mg | ORAL_TABLET | Freq: Every day | ORAL | 1 refills | Status: DC

## 2017-11-18 MED ORDER — LISINOPRIL-HYDROCHLOROTHIAZIDE 20-25 MG PO TABS: 1.0000 | ORAL_TABLET | Freq: Every day | ORAL | 1 refills | Status: DC

## 2017-11-18 MED ORDER — METOPROLOL SUCCINATE ER 25 MG PO TB24: 25.0000 mg | ORAL_TABLET | Freq: Every day | ORAL | 1 refills | Status: DC

## 2017-11-18 MED ORDER — PANTOPRAZOLE SODIUM 20 MG PO TBEC: 20.0000 mg | DELAYED_RELEASE_TABLET | Freq: Every day | ORAL | 3 refills | Status: DC

## 2017-11-18 NOTE — Progress Notes (Signed)
Subjective:    CC: BP  HPI:  Hypertension- Pt denies chest pain, SOB, dizziness, or heart palpitations.  Taking meds as directed w/o problems.  Denies medication side effects.    Impaired fasting glucose-no increased thirst or urination. No symptoms consistent with hypoglycemia.  GERD-he needs a refill on his PPI.  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   Past medical history, Surgical history, Family history not pertinant except as noted below, Social history, Allergies, and medications have been entered into the medical record, reviewed, and corrections made.   Review of Systems: No fevers, chills, night sweats, weight loss, chest pain, or shortness of breath.   Objective:    General: Well Developed, well nourished, and in no acute distress.  Neuro: Alert and oriented x3, extra-ocular muscles intact, sensation grossly intact.  HEENT: Normocephalic, atraumatic  Skin: Warm and dry, no rashes. Cardiac: Regular rate and rhythm, no murmurs rubs or gallops, no lower extremity edema.  Respiratory: Clear to auscultation bilaterally. Not using accessory muscles, speaking in full sentences.   Impression and Recommendations:    HTN - Uncontrolled today.  He had just taken his medication and gotten back from a long trip call.  His last 3 pressures in our office looks great so we will just continue to monitor.  IFG -hemoglobin A1c 6.1 so still in the prediabetic range.  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  He is willing to try it.  Warned about potential for GI upset.  Call if any problems or if  unable to take the medication.  GERD-refilled pantoprozole.    Tdap updated today.

## 2017-11-19 LAB — LIPID PANEL
CHOLESTEROL: 188 mg/dL (ref <200)
HDL: 43 mg/dL (ref >40)
LDL Cholesterol (Calc): 105 mg/dL (calc) — ABNORMAL HIGH
Non-HDL Cholesterol (Calc): 145 mg/dL (calc) — ABNORMAL HIGH (ref <130)
TRIGLYCERIDES: 300 mg/dL — AB (ref <150)
Total CHOL/HDL Ratio: 4.4 (calc) (ref <5.0)

## 2017-11-19 LAB — COMPLETE METABOLIC PANEL WITH GFR
AG RATIO: 1.6 (calc) (ref 1.0–2.5)
ALKALINE PHOSPHATASE (APISO): 96 U/L (ref 40–115)
ALT: 43 U/L (ref 9–46)
AST: 33 U/L (ref 10–35)
Albumin: 4.1 g/dL (ref 3.6–5.1)
BILIRUBIN TOTAL: 0.7 mg/dL (ref 0.2–1.2)
BUN: 14 mg/dL (ref 7–25)
CALCIUM: 9 mg/dL (ref 8.6–10.3)
CHLORIDE: 103 mmol/L (ref 98–110)
CO2: 32 mmol/L (ref 20–32)
Creat: 1.1 mg/dL (ref 0.70–1.33)
GFR, Est African American: 89 mL/min/{1.73_m2} (ref >=60)
GFR, Est Non African American: 77 mL/min/{1.73_m2} (ref >=60)
Globulin: 2.5 g/dL (calc) (ref 1.9–3.7)
Glucose, Bld: 134 mg/dL — ABNORMAL HIGH (ref 65–99)
POTASSIUM: 3.6 mmol/L (ref 3.5–5.3)
Sodium: 140 mmol/L (ref 135–146)
Total Protein: 6.6 g/dL (ref 6.1–8.1)

## 2017-11-19 LAB — PSA: PSA: 0.6 ng/mL (ref <=4.0)

## 2018-04-20 ENCOUNTER — Telehealth: Payer: Self-pay | Admitting: Family Medicine

## 2018-04-20 MED ORDER — LISINOPRIL-HYDROCHLOROTHIAZIDE 20-12.5 MG PO TABS: 1.0000 | ORAL_TABLET | Freq: Every day | ORAL | 1 refills | Status: DC

## 2018-04-20 NOTE — Telephone Encounter (Signed)
Ok, I will send over a new pill with less diuretic in it. Make sure to follow up next month.  If still urinating a lot we may need to evaluiate.

## 2018-04-20 NOTE — Telephone Encounter (Signed)
Patient calls and states that the Lisinopril with HCTZ is too strong. Its pulling a lot of fluid off of him, cramping. Hasn't taken the med in 2 days and got some better, urinating he states at least a gallon a day. [place]. Advised patient to take Lisinopril/HCTZ every other day until I can forward message to you for further instructions. KG LPN

## 2018-04-21 NOTE — Telephone Encounter (Signed)
Per patient request left message on Vm of MD instructions. KGLPN

## 2018-04-22 LAB — CBC AND DIFFERENTIAL
HEMATOCRIT: 41 (ref 41–53)
Hemoglobin: 14.6 (ref 13.5–17.5)
Platelets: 155 (ref 150–399)
WBC: 7.7

## 2018-04-22 LAB — LIPID PANEL
Cholesterol: 178 (ref 0–200)
HDL: 29 — AB (ref 35–70)
Triglycerides: 740 — AB (ref 40–160)

## 2018-04-22 LAB — BASIC METABOLIC PANEL
BUN: 12 (ref 4–21)
Creatinine: 1.1 (ref 0.6–1.3)
GLUCOSE: 338
Potassium: 3.7 (ref 3.4–5.3)
Sodium: 137 (ref 137–147)

## 2018-04-22 LAB — HEPATIC FUNCTION PANEL
ALK PHOS: 224 — AB (ref 25–125)
ALT: 52 — AB (ref 10–40)
AST: 31 (ref 14–40)

## 2018-04-26 ENCOUNTER — Telehealth: Payer: Self-pay | Admitting: *Deleted

## 2018-04-26 ENCOUNTER — Ambulatory Visit: Payer: BLUE CROSS/BLUE SHIELD | Admitting: Family Medicine

## 2018-04-26 ENCOUNTER — Encounter: Payer: Self-pay | Admitting: Family Medicine

## 2018-04-26 VITALS — BP 158/99 | HR 77 | Ht 75.0 in | Wt 263.0 lb

## 2018-04-26 DIAGNOSIS — R1011 Right upper quadrant pain: Secondary | ICD-10-CM

## 2018-04-26 DIAGNOSIS — E1165 Type 2 diabetes mellitus with hyperglycemia: Secondary | ICD-10-CM

## 2018-04-26 DIAGNOSIS — I10 Essential (primary) hypertension: Secondary | ICD-10-CM | POA: Diagnosis not present

## 2018-04-26 DIAGNOSIS — E118 Type 2 diabetes mellitus with unspecified complications: Secondary | ICD-10-CM | POA: Insufficient documentation

## 2018-04-26 DIAGNOSIS — R7309 Other abnormal glucose: Secondary | ICD-10-CM

## 2018-04-26 DIAGNOSIS — E119 Type 2 diabetes mellitus without complications: Secondary | ICD-10-CM | POA: Insufficient documentation

## 2018-04-26 LAB — POCT GLYCOSYLATED HEMOGLOBIN (HGB A1C): HEMOGLOBIN A1C: 12.2 % — AB (ref 4.0–5.6)

## 2018-04-26 MED ORDER — METFORMIN HCL ER 500 MG PO TB24: 500.0000 mg | ORAL_TABLET | Freq: Two times a day (BID) | ORAL | 5 refills | Status: DC

## 2018-04-26 MED ORDER — DAPAGLIFLOZIN-SAXAGLIPTIN 10-5 MG PO TABS: 1.0000 | ORAL_TABLET | Freq: Every day | ORAL | 3 refills | Status: DC

## 2018-04-26 NOTE — Patient Instructions (Signed)
Increase your metformin to twice a day. Also going to start a new pill called Qtern.  It is not covered by your insurance please let me know and we may be able to substitute something different. Call me with your blood sugar levels on Friday.  Try to check your sugar each morning for breakfast and then call me back on Friday and let me know if sugars are doing each day.

## 2018-04-26 NOTE — Progress Notes (Signed)
Subjective:    CC: IFG   HPI: 52 year old male is here today to follow-up for recent diagnosis of diabetes.  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  He said at night when he would lay down he would get up to urinate almost every 15 minutes.  It first he thought maybe it was a new blood pressure pill and thought may be could even be the metformin.  For about 3 weeks he went back and forth trying different medications to see which one was causing it.  Eventually he did go to a local urgent care and they referred him to the emergency department because his glucose was extremely high and he was dehydrated.  At that point in time he is Artie been drinking tons of water.  Since then he is tried to change his diet and cut back on his carb intake and eat more vegetables.  He is been drinking only water.  He cut out Gatorade etc.  He was started on Lantus temporarily as he is can be at work for the next couple of weeks.  He is currently taking 20 units of Lantus twice a day.  He did get a glucometer but was not sure how to actually use it and brought it here with him today so that we can instruct him on use.  He is just feeling extremely anxious about the situation. He had extreme fatigue and blurry vision when his sugar  High.    BP (!) 158/99   Pulse 77   Ht '6\' 3"'  (1.905 m)   Wt 263 lb (119.3 kg)   SpO2 97%   BMI 32.87 kg/m     Allergies  Allergen Reactions  . Mirtazapine     Other reaction(s): Unknown    Past Medical History:  Diagnosis Date  . ADHD 01/15/2009   Qualifier: Diagnosis of  By: Madilyn Fireman MD, Barnetta Chapel    . Anxiety state, unspecified 01/15/2009   Qualifier: Diagnosis of  By: Madilyn Fireman MD, Barnetta Chapel    . Essential hypertension, benign 06/06/2008   Qualifier: Diagnosis of  By: Madilyn Fireman MD, Barnetta Chapel    . HYPERTRIGLYCERIDEMIA 08/12/2009   Qualifier: Diagnosis of  By: Madilyn Fireman MD, Barnetta Chapel    . HYPERTROPHY  PROSTATE W/UR OBST & OTH LUTS 01/15/2009   Qualifier: Diagnosis of  By: Madilyn Fireman MD, Barnetta Chapel    . INSOMNIA 06/06/2008   Qualifier: Diagnosis of  By: Madilyn Fireman MD, Barnetta Chapel    . KNEE PAIN 08/12/2009   Qualifier: Diagnosis of  By: Madilyn Fireman MD, Barnetta Chapel      No past surgical history on file.  Social History   Socioeconomic History  . Marital status: Single    Spouse name: Not on file  . Number of children: Not on file  . Years of education: Not on file  . Highest education level: Not on file  Occupational History  . Not on file  Social Needs  . Financial resource strain: Not on file  . Food insecurity:    Worry: Not on file    Inability: Not on file  . Transportation needs:    Medical: Not on file    Non-medical: Not on file  Tobacco Use  . Smoking status: Former Smoker    Last attempt to quit: 09/03/2012    Years since quitting: 5.6  . Smokeless tobacco: Never Used  Substance and Sexual Activity  . Alcohol use: Not on  file  . Drug use: Not on file  . Sexual activity: Not on file  Lifestyle  . Physical activity:    Days per week: Not on file    Minutes per session: Not on file  . Stress: Not on file  Relationships  . Social connections:    Talks on phone: Not on file    Gets together: Not on file    Attends religious service: Not on file    Active member of club or organization: Not on file    Attends meetings of clubs or organizations: Not on file    Relationship status: Not on file  . Intimate partner violence:    Fear of current or ex partner: Not on file    Emotionally abused: Not on file    Physically abused: Not on file    Forced sexual activity: Not on file  Other Topics Concern  . Not on file  Social History Narrative  . Not on file    Family History  Problem Relation Age of Onset  . Cirrhosis Father   . Diabetes Mellitus II Father   . Gallbladder disease Father   . Gallbladder disease Sister   . Gallbladder disease Son     Outpatient  Encounter Medications as of 04/26/2018  Medication Sig  . Blood Glucose Monitoring Suppl (ONE TOUCH ULTRA 2) w/Device KIT See admin instructions.  Marland Kitchen LANTUS 100 UNIT/ML injection Inject 20 Units into the skin 2 (two) times daily. 20 units in the morning and 20 units in the evening  . lisinopril (PRINIVIL,ZESTRIL) 20 MG tablet Take 20 mg by mouth daily.  . metoprolol succinate (TOPROL-XL) 25 MG 24 hr tablet Take 1 tablet (25 mg total) by mouth daily.  . ONE TOUCH ULTRA TEST test strip USE 1 STRIP TO CHECK GLUCOSE TWICE DAILY FASTING AND BEFORE DINNER.  Marland Kitchen pantoprazole (PROTONIX) 20 MG tablet Take 1 tablet (20 mg total) by mouth daily.  . QUEtiapine (SEROQUEL) 25 MG tablet Take 25-100 mg by mouth at bedtime.  . [DISCONTINUED] amphetamine-dextroamphetamine (ADDERALL) 20 MG tablet Take 20 mg by mouth 3 (three) times daily.  . Dapagliflozin-sAXagliptin 10-5 MG TABS Take 1 tablet by mouth daily.  . metFORMIN (GLUCOPHAGE-XR) 500 MG 24 hr tablet Take 1 tablet (500 mg total) by mouth 2 (two) times daily with a meal.  . [DISCONTINUED] lisinopril-hydrochlorothiazide (PRINZIDE,ZESTORETIC) 20-12.5 MG tablet Take 1 tablet by mouth daily.  . [DISCONTINUED] metFORMIN (GLUCOPHAGE-XR) 500 MG 24 hr tablet Take 1 tablet (500 mg total) by mouth daily with breakfast. (Patient not taking: Reported on 04/26/2018)  . [DISCONTINUED] metFORMIN (GLUCOPHAGE-XR) 500 MG 24 hr tablet Take 1 tablet (500 mg total) by mouth 2 (two) times daily with a meal.   No facility-administered encounter medications on file as of 04/26/2018.       Review of Systems: No fevers, chills, night sweats, weight loss, chest pain, or shortness of breath.   Objective:    General: Well Developed, well nourished, and in no acute distress.  Neuro: Alert and oriented x3, extra-ocular muscles intact, sensation grossly intact.  HEENT: Normocephalic, atraumatic  Skin: Warm and dry, no rashes. Cardiac: Regular rate and rhythm, no murmurs rubs or gallops,  no lower extremity edema.  Respiratory: Clear to auscultation bilaterally. Not using accessory muscles, speaking in full sentences.   Impression and Recommendations:   DM - New diagnosis.   Uncontrolled.  Hemoglobin A1c of 12.2 today.  This is highly unusual in someone who had a hemoglobin A1c of  6.1 only 6 months ago on March 21.  I am not sure what may have caused this.  He is highly suspicious of his gallbladder and says that his father presented with diabetes had his gallbladder removed and it went away.  Interestingly his father, sister and son have all had their gallbladder removed. Will start Qtern and increase metformin to BID since on ER.   Given some additional handouts on dietary choices.  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  HTN - uncontrolled. Check BP with nurse again on Friday.  He is on the lisinopril as well. The Vergia Alcon should lower BP as well.    Time spent 30 min, > 50% of the time on new diagnosis of diabetes and Hypertension

## 2018-04-26 NOTE — Telephone Encounter (Signed)
Called [facility] to cancel out metformin. Spoke w/Tevin .Marland Kitchen.Loralee PacasBarkley, Kaylei Frink TamaroaLynetta

## 2018-04-28 ENCOUNTER — Encounter: Payer: Self-pay | Admitting: Family Medicine

## 2018-04-29 ENCOUNTER — Ambulatory Visit (INDEPENDENT_AMBULATORY_CARE_PROVIDER_SITE_OTHER): Payer: BLUE CROSS/BLUE SHIELD | Admitting: Family Medicine

## 2018-04-29 VITALS — BP 148/86 | HR 75

## 2018-04-29 DIAGNOSIS — E1165 Type 2 diabetes mellitus with hyperglycemia: Secondary | ICD-10-CM | POA: Diagnosis not present

## 2018-04-29 DIAGNOSIS — I10 Essential (primary) hypertension: Secondary | ICD-10-CM

## 2018-04-29 MED ORDER — LISINOPRIL 40 MG PO TABS: 40.0000 mg | ORAL_TABLET | Freq: Every day | ORAL | 1 refills | Status: DC

## 2018-04-29 NOTE — Progress Notes (Signed)
Pt came into clinic today for BP check. Denies any chest pain, or SOB. His BP was elevated on first check, did let him rest prior to rechecking. Pt was also to bring in his home blood sugar log, he did not. From memory he reports his sugar was 111 before breakfast, then 180 after breakfast today. States it was 111 before breakfast and 133 after breakfast yesterday. Does state he has not been able to start the Dapagliflozin-sAXagliptin 10-5 MG TABS yet, it is covered by his insurance but the pharmacy had to order it.   Denied flu shot. Denied eye exam, states he "can see just fine." Went over the correlation between DM and eye visits, states he will think about it.   Went over BP readings today with PCP, advised to increase lisinopril from 20mg  to 40mg  daily. New Rx sent. He requested a 90 day supply. Pt advised to return in 1 week for NV follow up, he will bring home blood sugar readings.

## 2018-04-29 NOTE — Progress Notes (Signed)
Agree with documentation as above.   Dominick Morella, MD  

## 2018-04-30 LAB — BASIC METABOLIC PANEL WITH GFR
BUN: 15 mg/dL (ref 7–25)
CALCIUM: 9 mg/dL (ref 8.6–10.3)
CHLORIDE: 105 mmol/L (ref 98–110)
CO2: 27 mmol/L (ref 20–32)
CREATININE: 1.21 mg/dL (ref 0.70–1.33)
GFR, EST AFRICAN AMERICAN: 79 mL/min/{1.73_m2} (ref >=60)
GFR, Est Non African American: 68 mL/min/{1.73_m2} (ref >=60)
Glucose, Bld: 116 mg/dL — ABNORMAL HIGH (ref 65–99)
Potassium: 4.5 mmol/L (ref 3.5–5.3)
Sodium: 139 mmol/L (ref 135–146)

## 2018-04-30 LAB — LIPID PANEL
CHOL/HDL RATIO: 4.2 (calc) (ref <5.0)
CHOLESTEROL: 168 mg/dL (ref <200)
HDL: 40 mg/dL — ABNORMAL LOW (ref >40)
LDL CHOLESTEROL (CALC): 94 mg/dL
Non-HDL Cholesterol (Calc): 128 mg/dL (calc) (ref <130)
TRIGLYCERIDES: 246 mg/dL — AB (ref <150)

## 2018-05-03 ENCOUNTER — Telehealth: Payer: Self-pay

## 2018-05-03 NOTE — Telephone Encounter (Signed)
Yes, start medication. If sugar drops below 100 then decrease his insulin by 2 units every 2 days.

## 2018-05-03 NOTE — Telephone Encounter (Signed)
Gary Stevens called and states his fasting blood sugar range is 104-115 mg/dl. She wanted to know if he should start the Dapagliflozin-saxagliptin along with the insulin and metformin. Please advise.

## 2018-05-04 NOTE — Telephone Encounter (Signed)
Left a message advising of recommendations.  

## 2018-05-06 ENCOUNTER — Ambulatory Visit (INDEPENDENT_AMBULATORY_CARE_PROVIDER_SITE_OTHER): Payer: BLUE CROSS/BLUE SHIELD | Admitting: Family Medicine

## 2018-05-06 VITALS — BP 143/80 | HR 62

## 2018-05-06 DIAGNOSIS — I10 Essential (primary) hypertension: Secondary | ICD-10-CM | POA: Diagnosis not present

## 2018-05-06 DIAGNOSIS — E1165 Type 2 diabetes mellitus with hyperglycemia: Secondary | ICD-10-CM | POA: Diagnosis not present

## 2018-05-06 NOTE — Progress Notes (Signed)
Agree with above.  Follow-up in 1 week.  Taper insulin down by 2 units daily

## 2018-05-06 NOTE — Progress Notes (Signed)
Pt came into clinic today for repeat BP check and Glucose check. Pt's BP was elevated on first check, did let him sit before recheck. He did bring his glucometer with him today. His readings are as follows:  9/5: 141 (8p) 9/5: 94 (6p) 9/5: 113 (10a) 9/4: 99 (7p) 9/4: 190 (6p) 9/4: 87 (10a) 9/3: 122 (10p) 9/3: 134 (8p) 9/3: 104 (8a) 9/2: 113 (6p) 9/2: 126 (9a) 9/1: 131 (8p) 9/1: 188 (1p) 9/1: 109 (8a)  Per PCP, Pt to start cutting back on the insulin. He is to decreased by 2 units daily. Currently on 20 units. He will go down to 18 units, then 16 units, then 14 units, etc until he is off completely. Pt to follow up in 1 week for NV BP/Glucose check. No other changes.

## 2018-05-10 ENCOUNTER — Ambulatory Visit: Payer: BLUE CROSS/BLUE SHIELD | Admitting: Family Medicine

## 2018-05-12 ENCOUNTER — Ambulatory Visit (INDEPENDENT_AMBULATORY_CARE_PROVIDER_SITE_OTHER): Payer: BLUE CROSS/BLUE SHIELD | Admitting: Family Medicine

## 2018-05-12 VITALS — BP 160/85 | HR 79 | Wt 262.0 lb

## 2018-05-12 DIAGNOSIS — I10 Essential (primary) hypertension: Secondary | ICD-10-CM | POA: Diagnosis not present

## 2018-05-12 DIAGNOSIS — E1165 Type 2 diabetes mellitus with hyperglycemia: Secondary | ICD-10-CM | POA: Diagnosis not present

## 2018-05-12 MED ORDER — ONETOUCH ULTRA BLUE VI STRP: ORAL_STRIP | 99 refills | Status: AC

## 2018-05-12 MED ORDER — METOPROLOL SUCCINATE ER 50 MG PO TB24: 50.0000 mg | ORAL_TABLET | Freq: Every day | ORAL | 3 refills | Status: DC

## 2018-05-12 NOTE — Progress Notes (Signed)
   Subjective:    Patient ID: Gary Stevens, male    DOB: 07-14-1966, 52 y.o.   MRN: [id number]  HPI  Hypertension- patient is here to follow up on blood pressure. First check of blood pressure is elevated. He has been under a lot more stress due to family issues. Denies chest pain, shortness of breath or headaches.   Diabetes -  Blood glucose  94 85 91 112 91 111 113 133 112 100  88 95 106 141 94    Review of Systems     Objective:   Physical Exam        Assessment & Plan:  Hypertension - Per Dr Linford ArnoldMetheney, increase Metoprolol to 50 mg once daily. Follow up in two weeks for blood pressure check.   DM - Per Dr Linford ArnoldMetheney, discontinue Lantus. Follow up in two weeks with blood sugar readings.

## 2018-05-12 NOTE — Progress Notes (Signed)
Agree with documentation as above.   Catherine Metheney, MD  

## 2018-05-12 NOTE — Patient Instructions (Addendum)
Stop Lantus. Increase Metoprolol to 50 mg once daily. Follow up with Dr Linford ArnoldMetheney in 2 weeks for blood pressure check.   Hypertension Hypertension, commonly called high blood pressure, is when the force of blood pumping through the arteries is too strong. The arteries are the blood vessels that carry blood from the heart throughout the body. Hypertension forces the heart to work harder to pump blood and may cause arteries to become narrow or stiff. Having untreated or uncontrolled hypertension can cause heart attacks, strokes, kidney disease, and other problems. A blood pressure reading consists of a higher number over a lower number. Ideally, your blood pressure should be below 120/80. The first ("top") number is called the systolic pressure. It is a measure of the pressure in your arteries as your heart beats. The second ("bottom") number is called the diastolic pressure. It is a measure of the pressure in your arteries as the heart relaxes. What are the causes? The cause of this condition is not known. What increases the risk? Some risk factors for high blood pressure are under your control. Others are not. Factors you can change  Smoking.  Having type 2 diabetes mellitus, high cholesterol, or both.  Not getting enough exercise or physical activity.  Being overweight.  Having too much fat, sugar, calories, or salt (sodium) in your diet.  Drinking too much alcohol. Factors that are difficult or impossible to change  Having chronic kidney disease.  Having a family history of high blood pressure.  Age. Risk increases with age.  Race. You may be at higher risk if you are African-American.  Gender. Men are at higher risk than women before age 52. After age 52, women are at higher risk than men.  Having obstructive sleep apnea.  Stress. What are the signs or symptoms? Extremely high blood pressure (hypertensive crisis) may cause:  Headache.  Anxiety.  Shortness of  breath.  Nosebleed.  Nausea and vomiting.  Severe chest pain.  Jerky movements you cannot control (seizures).  How is this diagnosed? This condition is diagnosed by measuring your blood pressure while you are seated, with your arm resting on a surface. The cuff of the blood pressure monitor will be placed directly against the skin of your upper arm at the level of your heart. It should be measured at least twice using the same arm. Certain conditions can cause a difference in blood pressure between your right and left arms. Certain factors can cause blood pressure readings to be lower or higher than normal (elevated) for a short period of time:  When your blood pressure is higher when you are in a health care provider's office than when you are at home, this is called white coat hypertension. Most people with this condition do not need medicines.  When your blood pressure is higher at home than when you are in a health care provider's office, this is called masked hypertension. Most people with this condition may need medicines to control blood pressure.  If you have a high blood pressure reading during one visit or you have normal blood pressure with other risk factors:  You may be asked to return on a different day to have your blood pressure checked again.  You may be asked to monitor your blood pressure at home for 1 week or longer.  If you are diagnosed with hypertension, you may have other blood or imaging tests to help your health care provider understand your overall risk for other conditions. How is this  treated? This condition is treated by making healthy lifestyle changes, such as eating healthy foods, exercising more, and reducing your alcohol intake. Your health care provider may prescribe medicine if lifestyle changes are not enough to get your blood pressure under control, and if:  Your systolic blood pressure is above 130.  Your diastolic blood pressure is above  80.  Your personal target blood pressure may vary depending on your medical conditions, your age, and other factors. Follow these instructions at home: Eating and drinking  Eat a diet that is high in fiber and potassium, and low in sodium, added sugar, and fat. An example eating plan is called the DASH (Dietary Approaches to Stop Hypertension) diet. To eat this way: ? Eat plenty of fresh fruits and vegetables. Try to fill half of your plate at each meal with fruits and vegetables. ? Eat whole grains, such as whole wheat pasta, brown rice, or whole grain bread. Fill about one quarter of your plate with whole grains. ? Eat or drink low-fat dairy products, such as skim milk or low-fat yogurt. ? Avoid fatty cuts of meat, processed or cured meats, and poultry with skin. Fill about one quarter of your plate with lean proteins, such as fish, chicken without skin, beans, eggs, and tofu. ? Avoid premade and processed foods. These tend to be higher in sodium, added sugar, and fat.  Reduce your daily sodium intake. Most people with hypertension should eat less than 1,500 mg of sodium a day.  Limit alcohol intake to no more than 1 drink a day for nonpregnant women and 2 drinks a day for men. One drink equals 12 oz of beer, 5 oz of wine, or 1 oz of hard liquor. Lifestyle  Work with your health care provider to maintain a healthy body weight or to lose weight. Ask what an ideal weight is for you.  Get at least 30 minutes of exercise that causes your heart to beat faster (aerobic exercise) most days of the week. Activities may include walking, swimming, or biking.  Include exercise to strengthen your muscles (resistance exercise), such as pilates or lifting weights, as part of your weekly exercise routine. Try to do these types of exercises for 30 minutes at least 3 days a week.  Do not use any products that contain nicotine or tobacco, such as cigarettes and e-cigarettes. If you need help quitting, ask  your health care provider.  Monitor your blood pressure at home as told by your health care provider.  Keep all follow-up visits as told by your health care provider. This is important. Medicines  Take over-the-counter and prescription medicines only as told by your health care provider. Follow directions carefully. Blood pressure medicines must be taken as prescribed.  Do not skip doses of blood pressure medicine. Doing this puts you at risk for problems and can make the medicine less effective.  Ask your health care provider about side effects or reactions to medicines that you should watch for. Contact a health care provider if:  You think you are having a reaction to a medicine you are taking.  You have headaches that keep coming back (recurring).  You feel dizzy.  You have swelling in your ankles.  You have trouble with your vision. Get help right away if:  You develop a severe headache or confusion.  You have unusual weakness or numbness.  You feel faint.  You have severe pain in your chest or abdomen.  You vomit repeatedly.  You have  trouble breathing. Summary  Hypertension is when the force of blood pumping through your arteries is too strong. If this condition is not controlled, it may put you at risk for serious complications.  Your personal target blood pressure may vary depending on your medical conditions, your age, and other factors. For most people, a normal blood pressure is less than 120/80.  Hypertension is treated with lifestyle changes, medicines, or a combination of both. Lifestyle changes include weight loss, eating a healthy, low-sodium diet, exercising more, and limiting alcohol.  This information is not intended to replace advice given to you by your health care provider. Make sure you discuss any questions you have with your health care provider. Document Released: 08/17/2005 Document Revised: 07/15/2016 Document Reviewed: 07/15/2016 Elsevier  Interactive Patient Education  [employer].

## 2018-05-13 ENCOUNTER — Other Ambulatory Visit: Payer: Self-pay

## 2018-05-17 ENCOUNTER — Telehealth: Payer: Self-pay | Admitting: *Deleted

## 2018-05-17 NOTE — Telephone Encounter (Signed)
Pt's forms completed,faxed,confirmation received and scanned.Gary Stevens, [name], CMA

## 2018-05-24 ENCOUNTER — Ambulatory Visit (INDEPENDENT_AMBULATORY_CARE_PROVIDER_SITE_OTHER): Payer: BLUE CROSS/BLUE SHIELD

## 2018-05-24 DIAGNOSIS — R1011 Right upper quadrant pain: Secondary | ICD-10-CM

## 2018-05-25 ENCOUNTER — Other Ambulatory Visit: Payer: Self-pay | Admitting: *Deleted

## 2018-05-25 DIAGNOSIS — K760 Fatty (change of) liver, not elsewhere classified: Secondary | ICD-10-CM

## 2018-05-26 ENCOUNTER — Ambulatory Visit (INDEPENDENT_AMBULATORY_CARE_PROVIDER_SITE_OTHER): Payer: BLUE CROSS/BLUE SHIELD | Admitting: Family Medicine

## 2018-05-26 ENCOUNTER — Encounter: Payer: Self-pay | Admitting: Family Medicine

## 2018-05-26 VITALS — BP 166/90 | HR 67 | Temp 98.4°F | Wt 256.0 lb

## 2018-05-26 DIAGNOSIS — I10 Essential (primary) hypertension: Secondary | ICD-10-CM

## 2018-05-26 DIAGNOSIS — E1165 Type 2 diabetes mellitus with hyperglycemia: Secondary | ICD-10-CM | POA: Diagnosis not present

## 2018-05-26 LAB — GLUCOSE, POCT (MANUAL RESULT ENTRY): POC GLUCOSE: 113 mg/dL — AB (ref 70–99)

## 2018-05-26 NOTE — Progress Notes (Signed)
Pt presented today for bp reading & glucose check. Denies chest pain, SOB, lightheadedness or dizziness. As per pt, under a lot of stress due to being out of work, father in hospice care. Per pt no missed dose of daily meds. Pt's bp reading was 169/90, pulse 78. Pt sat for 15 mins - second bp reading was 166/90, pulse 67. Glucose reading on clinic's meter was 113 & on pt's home meter reading was 108. Provider has been advise of bp/glucose readings. Pt has requested a copy of disability form & a letter for work. Paperwork & letter was given to pt during nurse visit. Pt will be scheduling an appt with provider on 06/01/18.

## 2018-05-27 NOTE — Progress Notes (Signed)
Agree with documentation as above.   Chevon Fomby, MD  

## 2018-06-02 ENCOUNTER — Ambulatory Visit (INDEPENDENT_AMBULATORY_CARE_PROVIDER_SITE_OTHER): Payer: BLUE CROSS/BLUE SHIELD | Admitting: Family Medicine

## 2018-06-02 ENCOUNTER — Encounter: Payer: Self-pay | Admitting: Family Medicine

## 2018-06-02 VITALS — BP 136/68 | HR 64 | Ht 75.0 in | Wt 252.0 lb

## 2018-06-02 DIAGNOSIS — E1165 Type 2 diabetes mellitus with hyperglycemia: Secondary | ICD-10-CM

## 2018-06-02 DIAGNOSIS — I10 Essential (primary) hypertension: Secondary | ICD-10-CM | POA: Diagnosis not present

## 2018-06-02 DIAGNOSIS — K760 Fatty (change of) liver, not elsewhere classified: Secondary | ICD-10-CM | POA: Diagnosis not present

## 2018-06-02 MED ORDER — DAPAGLIFLOZIN-SAXAGLIPTIN 10-5 MG PO TABS: 1.0000 | ORAL_TABLET | Freq: Every day | ORAL | 1 refills | Status: DC

## 2018-06-02 NOTE — Progress Notes (Signed)
Subjective:    CC: HTN, DM   HPI:  Hypertension- Pt denies chest pain, SOB, dizziness, or heart palpitations.  Taking meds as directed w/o problems.  Denies medication side effects.    Diabetes - no hypoglycemic events. No wounds or sores that are not healing well. No increased thirst or urination. Checking glucose at home. Taking medications as prescribed without any side effects. He is off his insulin completely and dong well on the Qtern and metformin.    Fatty liver-he did go for the ultrasound for further evaluation and it just confirmed fatty liver.  Past medical history, Surgical history, Family history not pertinant except as noted below, Social history, Allergies, and medications have been entered into the medical record, reviewed, and corrections made.   Review of Systems: No fevers, chills, night sweats, weight loss, chest pain, or shortness of breath.   Objective:    General: Well Developed, well nourished, and in no acute distress.  Neuro: Alert and oriented x3, extra-ocular muscles intact, sensation grossly intact.  HEENT: Normocephalic, atraumatic  Skin: Warm and dry, no rashes. Cardiac: Regular rate and rhythm, no murmurs rubs or gallops, no lower extremity edema.  Respiratory: Clear to auscultation bilaterally. Not using accessory muscles, speaking in full sentences.   Impression and Recommendations:    HTN - Well controlled. Continue current regimen. Follow up in 3 months.    DM- F/U in 2 months.  Is actually really doing fantastic.  Blood sugars have been well under 100.  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  He said he is Artie made some plans on how he is going to eat more healthy.  He is can have a refrigerator in a microwave in his truck.  Fatty liver- due to recheck  liver function.  Ultrasound just confirmed fatty liver.  Important to control weight and diet to reduce fat and inflammation in the liver. Ok to return to work next Thursday.

## 2018-07-07 ENCOUNTER — Ambulatory Visit (INDEPENDENT_AMBULATORY_CARE_PROVIDER_SITE_OTHER): Payer: BLUE CROSS/BLUE SHIELD | Admitting: Family Medicine

## 2018-07-07 ENCOUNTER — Encounter: Payer: Self-pay | Admitting: Family Medicine

## 2018-07-07 VITALS — BP 175/88 | HR 65 | Ht 75.0 in | Wt 241.0 lb

## 2018-07-07 DIAGNOSIS — L02415 Cutaneous abscess of right lower limb: Secondary | ICD-10-CM

## 2018-07-07 MED ORDER — DOXYCYCLINE HYCLATE 100 MG PO TABS: 100.0000 mg | ORAL_TABLET | Freq: Two times a day (BID) | ORAL | 0 refills | Status: DC

## 2018-07-07 MED ORDER — CEFDINIR 300 MG PO CAPS: 300.0000 mg | ORAL_CAPSULE | Freq: Two times a day (BID) | ORAL | 0 refills | Status: DC

## 2018-07-07 NOTE — Patient Instructions (Signed)
Thank you for coming in today. Take the doxycycline and omnicef anitbiotics twice daily for 1 week.   I will have results from the abscess in a few days.  If will let you know when you know when I find out.     Incision and Drainage, Care After Refer to this sheet in the next few weeks. These instructions provide you with information about caring for yourself after your procedure. Your health care provider may also give you more specific instructions. Your treatment has been planned according to current medical practices, but problems sometimes occur. Call your health care provider if you have any problems or questions after your procedure. What can I expect after the procedure? After the procedure, it is common to have:  Pain or discomfort around your incision site.  Drainage from your incision.  Follow these instructions at home:  Take over-the-counter and prescription medicines only as told by your health care provider.  If you were prescribed an antibiotic medicine, take it as told by your health care provider.Do not stop taking the antibiotic even if you start to feel better.  Followinstructions from your health care provider about: ? How to take care of your incision. ? When and how you should change your packing and bandage (dressing). Wash your hands with soap and water before you change your dressing. If soap and water are not available, use hand sanitizer. ? When you should remove your dressing.  Do not take baths, swim, or use a hot tub until your health care provider approves.  Keep all follow-up visits as told by your health care provider. This is important.  Check your incision area every day for signs of infection. Check for: ? More redness, swelling, or pain. ? More fluid or blood. ? Warmth. ? Pus or a bad smell. Contact a health care provider if:  Your cyst or abscess returns.  You have a fever.  You have more redness, swelling, or pain around your  incision.  You have more fluid or blood coming from your incision.  Your incision feels warm to the touch.  You have pus or a bad smell coming from your incision. Get help right away if:  You have severe pain or bleeding.  You cannot eat or drink without vomiting.  You have decreased urine output.  You become short of breath.  You have chest pain.  You cough up blood.  The area where the incision and drainage occurred becomes numb or it tingles. This information is not intended to replace advice given to you by your health care provider. Make sure you discuss any questions you have with your health care provider. Document Released: 11/09/2011 Document Revised: 01/17/2016 Document Reviewed: 06/07/2015 Elsevier Interactive Patient Education  [employer].

## 2018-07-07 NOTE — Progress Notes (Signed)
Gary Stevens is a 52 y.o. male who presents to [facility] today for abscess.  Gary Stevens notes a 3-week history of growing painful abscess on his right calf.  The infection started as a small bump on a hair follicle that worsened slowly.  He is tried treatment with heat and compression which have helped only a little.  No fevers or chills nausea vomiting or diarrhea.  Patient has diabetes and is worried that it may be worsening.  No chest pain palpitations shortness of breath.  He notes the abscess is not draining yet.  He notes it is quite painful.   ROS as above:  Exam:  BP (!) 175/88   Pulse 65   Ht '6\' 3"'  (1.905 m)   Wt 241 lb (109.3 kg)   BMI 30.12 kg/m  Wt Readings from Last 5 Encounters:  07/07/18 241 lb (109.3 kg)  06/02/18 252 lb (114.3 kg)  05/26/18 256 lb 0.6 oz (116.1 kg)  05/12/18 262 lb (118.8 kg)  04/26/18 263 lb (119.3 kg)    Gen: Well NAD HEENT: EOMI,  MMM Lungs: Normal work of breathing. CTABL Heart: RRR no MRG Abd: NABS, Soft. Nondistended, Nontender Exts: Brisk capillary refill, warm and well perfused.  Skin right leg large 2 cm area of fluctuance and induration right lateral calf.  Surrounding erythema extends from the lateral mid calf to the lateral malleolus. Ankle and knee motion are intact.  Pulses and capillary refill are intact distally.  Abscess incision and drainage. Consent obtained and timeout performed. Skin cleaned with alcohol, and cold spray applied. 4 mL of lidocaine  injected achieving good anesthesia. Skin was again cleaned with alcohol. A sharp incision was made to the area of fluctuance. The incision was widened and pus was expressed. Pus was cultured. Blunt dissection was used to break up loculations. Further pus was expressed. Patient tolerated the procedure well. A dressing was applied       Assessment and  Plan: 52 y.o. male with  Abscess right calf incised and drained.  Culture pending empiric treatment with both doxycycline and Omnicef.  Recheck if not improving.  Follow-up with PCP as needed in the near future.   Orders Placed This Encounter  Procedures  . Wound culture    Order Specific Question:   Source    Answer:   right calf abscess   Meds ordered this encounter  Medications  . doxycycline (VIBRA-TABS) 100 MG tablet    Sig: Take 1 tablet (100 mg total) by mouth 2 (two) times daily.    Dispense:  14 tablet    Refill:  0  . cefdinir (OMNICEF) 300 MG capsule    Sig: Take 1 capsule (300 mg total) by mouth 2 (two) times daily.    Dispense:  14 capsule    Refill:  0     Historical information moved to improve visibility of documentation.  Past Medical History:  Diagnosis Date  . ADHD 01/15/2009   Qualifier: Diagnosis of  By: Madilyn Fireman MD, Barnetta Chapel    . Anxiety state, unspecified 01/15/2009   Qualifier: Diagnosis of  By: Madilyn Fireman MD, Barnetta Chapel    . Essential hypertension, benign 06/06/2008   Qualifier: Diagnosis of  By: Madilyn Fireman MD, Barnetta Chapel    . HYPERTRIGLYCERIDEMIA 08/12/2009   Qualifier: Diagnosis of  By: Madilyn Fireman MD, Barnetta Chapel    . HYPERTROPHY PROSTATE W/UR OBST & OTH LUTS 01/15/2009   Qualifier: Diagnosis of  By: Madilyn Fireman  MD, Barnetta Chapel    . INSOMNIA 06/06/2008   Qualifier: Diagnosis of  By: Madilyn Fireman MD, Barnetta Chapel    . KNEE PAIN 08/12/2009   Qualifier: Diagnosis of  By: Madilyn Fireman MD, Barnetta Chapel     No past surgical history on file. Social History   Tobacco Use  . Smoking status: Current Every Day Smoker    Packs/day: 1.00    Years: 30.00    Pack years: 30.00    Types: Cigarettes  . Smokeless tobacco: Never Used  Substance Use Topics  . Alcohol use: Not on file   family history includes Cirrhosis in his father; Diabetes Mellitus II in his father; Gallbladder disease in his father, sister, and son.  Medications: Current Outpatient Medications  Medication Sig  Dispense Refill  . amphetamine-dextroamphetamine (ADDERALL) 20 MG tablet Take 20 mg by mouth 3 (three) times daily.  0  . Blood Glucose Monitoring Suppl (ONE TOUCH ULTRA 2) w/Device KIT See admin instructions.  0  . Dapagliflozin-sAXagliptin (QTERN) 10-5 MG TABS Take 1 tablet by mouth daily. 90 tablet 1  . lisinopril (PRINIVIL,ZESTRIL) 40 MG tablet Take 1 tablet (40 mg total) by mouth daily. 90 tablet 1  . metFORMIN (GLUCOPHAGE-XR) 500 MG 24 hr tablet Take 1 tablet (500 mg total) by mouth 2 (two) times daily with a meal. 60 tablet 5  . metoprolol succinate (TOPROL-XL) 50 MG 24 hr tablet Take 1 tablet (50 mg total) by mouth daily. Take with or immediately following a meal. 90 tablet 3  . ONE TOUCH ULTRA TEST test strip Check blood sugar 4 times daily 300 each prn  . pantoprazole (PROTONIX) 20 MG tablet Take 1 tablet (20 mg total) by mouth daily. 90 tablet 3  . QUEtiapine (SEROQUEL) 25 MG tablet Take 25-100 mg by mouth at bedtime.  5  . cefdinir (OMNICEF) 300 MG capsule Take 1 capsule (300 mg total) by mouth 2 (two) times daily. 14 capsule 0  . doxycycline (VIBRA-TABS) 100 MG tablet Take 1 tablet (100 mg total) by mouth 2 (two) times daily. 14 tablet 0   No current facility-administered medications for this visit.    Allergies  Allergen Reactions  . Mirtazapine     Other reaction(s): Unknown     Discussed warning signs or symptoms. Please see discharge instructions. Patient expresses understanding.

## 2018-07-10 LAB — WOUND CULTURE
MICRO NUMBER:: 91342809
SPECIMEN QUALITY:: ADEQUATE

## 2018-08-02 ENCOUNTER — Ambulatory Visit: Payer: BLUE CROSS/BLUE SHIELD | Admitting: Family Medicine

## 2018-09-01 ENCOUNTER — Ambulatory Visit (INDEPENDENT_AMBULATORY_CARE_PROVIDER_SITE_OTHER): Payer: 59 | Admitting: Family Medicine

## 2018-09-01 ENCOUNTER — Encounter: Payer: Self-pay | Admitting: Family Medicine

## 2018-09-01 VITALS — BP 142/80 | HR 65 | Ht 75.0 in | Wt 231.0 lb

## 2018-09-01 DIAGNOSIS — E1165 Type 2 diabetes mellitus with hyperglycemia: Secondary | ICD-10-CM | POA: Diagnosis not present

## 2018-09-01 DIAGNOSIS — I10 Essential (primary) hypertension: Secondary | ICD-10-CM | POA: Diagnosis not present

## 2018-09-01 LAB — POCT GLYCOSYLATED HEMOGLOBIN (HGB A1C): HEMOGLOBIN A1C: 5.6 % (ref 4.0–5.6)

## 2018-09-01 MED ORDER — SAXAGLIPTIN HCL 5 MG PO TABS: 5.0000 mg | ORAL_TABLET | Freq: Every day | ORAL | 4 refills | Status: DC

## 2018-09-01 MED ORDER — LISINOPRIL-HYDROCHLOROTHIAZIDE 20-25 MG PO TABS: 1.0000 | ORAL_TABLET | Freq: Every day | ORAL | 1 refills | Status: DC

## 2018-09-01 NOTE — Progress Notes (Signed)
Subjective:    CC: HTN, DM   HPI:  Hypertension- Pt denies chest pain, SOB, dizziness, or heart palpitations.  Taking meds as directed w/o problems.  Denies medication side effects.    Diabetes - no hypoglycemic events. No wounds or sores that are not healing well. No increased thirst or urination. Checking glucose at home. Taking medications as prescribed without any side effects. He has really changed his diet and is eating more protein and veggies and protein.    Lab Results  Component Value Date   HGBA1C 5.6 09/01/2018  \ His father who was really ill finally passed away in 07/23/2023.  His mother who has dementia is going to be placed into a assisted living where she can have her medications administered.  BP (!) 142/80   Pulse 65   Ht '6\' 3"'  (1.905 m)   Wt 231 lb (104.8 kg)   SpO2 99%   BMI 28.87 kg/m     Allergies  Allergen Reactions  . Mirtazapine     Other reaction(s): Unknown    Past Medical History:  Diagnosis Date  . ADHD 01/15/2009   Qualifier: Diagnosis of  By: Madilyn Fireman MD, Barnetta Chapel    . Anxiety state, unspecified 01/15/2009   Qualifier: Diagnosis of  By: Madilyn Fireman MD, Barnetta Chapel    . Essential hypertension, benign 06/06/2008   Qualifier: Diagnosis of  By: Madilyn Fireman MD, Barnetta Chapel    . HYPERTRIGLYCERIDEMIA 08/12/2009   Qualifier: Diagnosis of  By: Madilyn Fireman MD, Barnetta Chapel    . HYPERTROPHY PROSTATE W/UR OBST & OTH LUTS 01/15/2009   Qualifier: Diagnosis of  By: Madilyn Fireman MD, Barnetta Chapel    . INSOMNIA 06/06/2008   Qualifier: Diagnosis of  By: Madilyn Fireman MD, Barnetta Chapel    . KNEE PAIN 08/12/2009   Qualifier: Diagnosis of  By: Madilyn Fireman MD, Barnetta Chapel      No past surgical history on file.  Social History   Socioeconomic History  . Marital status: Single    Spouse name: Not on file  . Number of children: Not on file  . Years of education: Not on file  . Highest education level: Not on file  Occupational History  . Not on file  Social Needs  . Financial resource strain:  Not on file  . Food insecurity:    Worry: Not on file    Inability: Not on file  . Transportation needs:    Medical: Not on file    Non-medical: Not on file  Tobacco Use  . Smoking status: Current Every Day Smoker    Packs/day: 1.00    Years: 30.00    Pack years: 30.00    Types: Cigarettes  . Smokeless tobacco: Never Used  Substance and Sexual Activity  . Alcohol use: Not on file  . Drug use: Not on file  . Sexual activity: Not on file  Lifestyle  . Physical activity:    Days per week: Not on file    Minutes per session: Not on file  . Stress: Not on file  Relationships  . Social connections:    Talks on phone: Not on file    Gets together: Not on file    Attends religious service: Not on file    Active member of club or organization: Not on file    Attends meetings of clubs or organizations: Not on file    Relationship status: Not on file  . Intimate partner violence:    Fear of current or ex partner: Not on file    Emotionally  abused: Not on file    Physically abused: Not on file    Forced sexual activity: Not on file  Other Topics Concern  . Not on file  Social History Narrative  . Not on file    Family History  Problem Relation Age of Onset  . Cirrhosis Father   . Diabetes Mellitus II Father   . Gallbladder disease Father   . Gallbladder disease Sister   . Gallbladder disease Son     Outpatient Encounter Medications as of 09/01/2018  Medication Sig  . amphetamine-dextroamphetamine (ADDERALL) 20 MG tablet Take 20 mg by mouth 3 (three) times daily.  . Blood Glucose Monitoring Suppl (ONE TOUCH ULTRA 2) w/Device KIT See admin instructions.  Marland Kitchen lisinopril-hydrochlorothiazide (PRINZIDE,ZESTORETIC) 20-25 MG tablet Take 1 tablet by mouth daily. . Patient will be due for this refill next month.  . metFORMIN (GLUCOPHAGE-XR) 500 MG 24 hr tablet Take 1 tablet (500 mg total) by mouth 2 (two) times daily with a meal.  . metoprolol succinate (TOPROL-XL) 50 MG 24 hr tablet  Take 1 tablet (50 mg total) by mouth daily. Take with or immediately following a meal.  . ONE TOUCH ULTRA TEST test strip Check blood sugar 4 times daily  . pantoprazole (PROTONIX) 20 MG tablet Take 1 tablet (20 mg total) by mouth daily.  . QUEtiapine (SEROQUEL) 25 MG tablet Take 25-100 mg by mouth at bedtime.  . saxagliptin HCl (ONGLYZA) 5 MG TABS tablet Take 1 tablet (5 mg total) by mouth daily.  . [DISCONTINUED] cefdinir (OMNICEF) 300 MG capsule Take 1 capsule (300 mg total) by mouth 2 (two) times daily.  . [DISCONTINUED] Dapagliflozin-sAXagliptin (QTERN) 10-5 MG TABS Take 1 tablet by mouth daily.  . [DISCONTINUED] doxycycline (VIBRA-TABS) 100 MG tablet Take 1 tablet (100 mg total) by mouth 2 (two) times daily.  . [DISCONTINUED] lisinopril (PRINIVIL,ZESTRIL) 40 MG tablet Take 1 tablet (40 mg total) by mouth daily.   No facility-administered encounter medications on file as of 09/01/2018.         Objective:    General: Well Developed, well nourished, and in no acute distress.  Neuro: Alert and oriented x3, extra-ocular muscles intact, sensation grossly intact.  HEENT: Normocephalic, atraumatic  Skin: Warm and dry, no rashes. Cardiac: Regular rate and rhythm, no murmurs rubs or gallops, no lower extremity edema.  Respiratory: Clear to auscultation bilaterally. Not using accessory muscles, speaking in full sentences.   Impression and Recommendations:    DM -his A1c, 5.6, looks absolutely phenomenal today he is really done a great job.  He is actually been out of his Vergia Alcon for about 3 weeks.  So were going to discontinue this as he is had major problems picking it up at the pharmacy and will to switch him to plain Onglyza since I am going to go ahead and add a diuretic back for  pressure control.  We will have him follow-up in 90 days.    HTN -  Uncontrolled.  I will switch the lisinopril back to the lisinopril HCT that has a diuretic in it.  He had originally stop the diuretic when he had  called complaining of increased urination and it turned out that he had actually become an uncontrolled diabetic which was causing urinary frequency.  Grief - he is doing OK overall.

## 2018-09-01 NOTE — Patient Instructions (Addendum)
Finish up your [place] and then we will change it to Onglyza, which is 1 of the components of the [place]. Continue your metformin. I will switch the lisinopril back to the lisinopril HCT that has a diuretic in it.

## 2018-09-02 ENCOUNTER — Telehealth: Payer: Self-pay | Admitting: Family Medicine

## 2018-09-02 MED ORDER — SITAGLIPTIN PHOSPHATE 100 MG PO TABS: 100.0000 mg | ORAL_TABLET | Freq: Every day | ORAL | 1 refills | Status: DC

## 2018-09-02 NOTE — Telephone Encounter (Signed)
K, new prescription sent for Januvia.  Please call patient and just let him know this so that he is aware it has been changed per preference of his insurance company.

## 2018-09-02 NOTE — Telephone Encounter (Signed)
Cancelled Ongylza at [facility]. Patient was notified of the change as well and voices understanding.

## 2018-09-02 NOTE — Telephone Encounter (Signed)
I received a fax from [facility] that patient will have to try and fail [place] before he can start Onglyza. I do not see that he has been on this in the past. Please advise.

## 2018-09-30 ENCOUNTER — Ambulatory Visit: Payer: BLUE CROSS/BLUE SHIELD

## 2018-11-07 ENCOUNTER — Ambulatory Visit (INDEPENDENT_AMBULATORY_CARE_PROVIDER_SITE_OTHER): Payer: 59 | Admitting: Family Medicine

## 2018-11-07 VITALS — BP 151/80 | HR 68 | Temp 97.8°F | Wt 230.0 lb

## 2018-11-07 DIAGNOSIS — I10 Essential (primary) hypertension: Secondary | ICD-10-CM | POA: Diagnosis not present

## 2018-11-07 NOTE — Progress Notes (Signed)
Patient in today for BP check. BP at last visit was 142/80. Patient reports he is taking his BP meds as prescribed  . BP in the office today is 151/80. Pt denies headaches, blurred vision, chest pain and shortness of breath, Provider advised to increase metoprolol to 2 tablets daily. Pt is gonna come back in on Friday  For a nurse visit to see if any improvement.

## 2018-11-07 NOTE — Progress Notes (Signed)
Agree with documentation as above.   Gary Vitanza, MD  

## 2018-11-09 ENCOUNTER — Telehealth: Payer: Self-pay

## 2018-11-09 ENCOUNTER — Other Ambulatory Visit: Payer: Self-pay | Admitting: Family Medicine

## 2018-11-09 DIAGNOSIS — I10 Essential (primary) hypertension: Secondary | ICD-10-CM

## 2018-11-09 MED ORDER — LISINOPRIL-HYDROCHLOROTHIAZIDE 20-25 MG PO TABS: 1.0000 | ORAL_TABLET | Freq: Every day | ORAL | 1 refills | Status: DC

## 2018-11-09 NOTE — Telephone Encounter (Signed)
[facility] called and left a message requesting potassium pills due to muscle cramps.

## 2018-11-09 NOTE — Telephone Encounter (Signed)
Only if his potassium is low. He is welcome to go for labs to check.  He is over due for a CMP anyway.

## 2018-11-10 ENCOUNTER — Other Ambulatory Visit: Payer: Self-pay

## 2018-11-10 MED ORDER — METFORMIN HCL ER 500 MG PO TB24: 500.0000 mg | ORAL_TABLET | Freq: Two times a day (BID) | ORAL | 0 refills | Status: DC

## 2018-11-10 NOTE — Telephone Encounter (Signed)
Left message for a return call

## 2018-11-10 NOTE — Telephone Encounter (Signed)
Patient advised of recommendations.  

## 2018-11-11 ENCOUNTER — Other Ambulatory Visit: Payer: Self-pay

## 2018-11-11 ENCOUNTER — Ambulatory Visit (INDEPENDENT_AMBULATORY_CARE_PROVIDER_SITE_OTHER): Payer: 59 | Admitting: Family Medicine

## 2018-11-11 VITALS — BP 126/67 | HR 58 | Wt 229.0 lb

## 2018-11-11 DIAGNOSIS — I10 Essential (primary) hypertension: Secondary | ICD-10-CM

## 2018-11-11 MED ORDER — METOPROLOL SUCCINATE ER 100 MG PO TB24: 100.0000 mg | ORAL_TABLET | Freq: Every day | ORAL | 1 refills | Status: DC

## 2018-11-11 NOTE — Progress Notes (Signed)
Established Patient Office Visit  Subjective:  Patient ID: Gary Stevens, male    DOB: 01-19-1966  Age: 53 y.o. MRN: [id number]  CC:  Chief Complaint  Patient presents with  . Hypertension    HPI NEVADA MULLETT presents for hypertension. He did increase the Metoprolol to 100 mg once daily. Denies headache, chest pain, shortness of breath or medication problems.   Past Medical History:  Diagnosis Date  . ADHD 01/15/2009   Qualifier: Diagnosis of  By: Madilyn Fireman MD, Barnetta Chapel    . Anxiety state, unspecified 01/15/2009   Qualifier: Diagnosis of  By: Madilyn Fireman MD, Barnetta Chapel    . Essential hypertension, benign 06/06/2008   Qualifier: Diagnosis of  By: Madilyn Fireman MD, Barnetta Chapel    . HYPERTRIGLYCERIDEMIA 08/12/2009   Qualifier: Diagnosis of  By: Madilyn Fireman MD, Barnetta Chapel    . HYPERTROPHY PROSTATE W/UR OBST & OTH LUTS 01/15/2009   Qualifier: Diagnosis of  By: Madilyn Fireman MD, Barnetta Chapel    . INSOMNIA 06/06/2008   Qualifier: Diagnosis of  By: Madilyn Fireman MD, Barnetta Chapel    . KNEE PAIN 08/12/2009   Qualifier: Diagnosis of  By: Madilyn Fireman MD, Barnetta Chapel      No past surgical history on file.  Family History  Problem Relation Age of Onset  . Cirrhosis Father   . Diabetes Mellitus II Father   . Gallbladder disease Father   . Gallbladder disease Sister   . Gallbladder disease Son     Social History   Socioeconomic History  . Marital status: Single    Spouse name: Not on file  . Number of children: Not on file  . Years of education: Not on file  . Highest education level: Not on file  Occupational History  . Not on file  Social Needs  . Financial resource strain: Not on file  . Food insecurity:    Worry: Not on file    Inability: Not on file  . Transportation needs:    Medical: Not on file    Non-medical: Not on file  Tobacco Use  . Smoking status: Current Every Day Smoker    Packs/day: 1.00    Years: 30.00    Pack years: 30.00    Types: Cigarettes  . Smokeless tobacco: Never Used   Substance and Sexual Activity  . Alcohol use: Not on file  . Drug use: Not on file  . Sexual activity: Not on file  Lifestyle  . Physical activity:    Days per week: Not on file    Minutes per session: Not on file  . Stress: Not on file  Relationships  . Social connections:    Talks on phone: Not on file    Gets together: Not on file    Attends religious service: Not on file    Active member of club or organization: Not on file    Attends meetings of clubs or organizations: Not on file    Relationship status: Not on file  . Intimate partner violence:    Fear of current or ex partner: Not on file    Emotionally abused: Not on file    Physically abused: Not on file    Forced sexual activity: Not on file  Other Topics Concern  . Not on file  Social History Narrative  . Not on file    Outpatient Medications Prior to Visit  Medication Sig Dispense Refill  . amphetamine-dextroamphetamine (ADDERALL) 20 MG tablet Take 20 mg by mouth 3 (three) times daily.  0  . Blood  Glucose Monitoring Suppl (ONE TOUCH ULTRA 2) w/Device KIT See admin instructions.  0  . lisinopril-hydrochlorothiazide (PRINZIDE,ZESTORETIC) 20-25 MG tablet Take 1 tablet by mouth daily. 90 tablet 1  . metFORMIN (GLUCOPHAGE-XR) 500 MG 24 hr tablet Take 1 tablet (500 mg total) by mouth 2 (two) times daily. 180 tablet 0  . ONE TOUCH ULTRA TEST test strip Check blood sugar 4 times daily 300 each prn  . pantoprazole (PROTONIX) 20 MG tablet Take 1 tablet (20 mg total) by mouth daily. 90 tablet 3  . QUEtiapine (SEROQUEL) 25 MG tablet Take 25-100 mg by mouth at bedtime.  5  . sitaGLIPtin (JANUVIA) 100 MG tablet Take 1 tablet (100 mg total) by mouth daily. 90 tablet 1  . metoprolol succinate (TOPROL-XL) 50 MG 24 hr tablet Take 1 tablet (50 mg total) by mouth daily. Take with or immediately following a meal. 90 tablet 3   No facility-administered medications prior to visit.     Allergies  Allergen Reactions  . Mirtazapine      Other reaction(s): Unknown    ROS Review of Systems    Objective:    Physical Exam  BP 126/67   Pulse (!) 58   Wt 229 lb (103.9 kg)   SpO2 98%   BMI 28.62 kg/m  Wt Readings from Last 3 Encounters:  11/11/18 229 lb (103.9 kg)  11/07/18 230 lb (104.3 kg)  09/01/18 231 lb (104.8 kg)     Health Maintenance Due  Topic Date Due  . PNEUMOCOCCAL POLYSACCHARIDE VACCINE AGE 27-64 HIGH RISK  09/21/1967  . OPHTHALMOLOGY EXAM  09/21/1975  . HIV Screening  09/20/1980  . COLONOSCOPY  09/21/2015    There are no preventive care reminders to display for this patient.  Lab Results  Component Value Date   TSH 3.440 01/15/2009   Lab Results  Component Value Date   WBC 7.7 04/22/2018   HGB 14.6 04/22/2018   HCT 41 04/22/2018   PLT 155 04/22/2018   Lab Results  Component Value Date   NA 139 04/29/2018   K 4.5 04/29/2018   CO2 27 04/29/2018   GLUCOSE 116 (H) 04/29/2018   BUN 15 04/29/2018   CREATININE 1.21 04/29/2018   BILITOT 0.7 11/19/2017   ALKPHOS 224 (A) 04/22/2018   AST 31 04/22/2018   ALT 52 (A) 04/22/2018   PROT 6.6 11/19/2017   ALBUMIN 4.8 01/11/2017   CALCIUM 9.0 04/29/2018   Lab Results  Component Value Date   CHOL 168 04/29/2018   Lab Results  Component Value Date   HDL 40 (L) 04/29/2018   Lab Results  Component Value Date   LDLCALC 94 04/29/2018   Lab Results  Component Value Date   TRIG 246 (H) 04/29/2018   Lab Results  Component Value Date   CHOLHDL 4.2 04/29/2018   Lab Results  Component Value Date   HGBA1C 5.6 09/01/2018      Assessment & Plan:  Hypertension - Blood pressure controlled on the increased dose. Per Dr Madilyn Fireman, continue current medications as prescribed. Follow up in 3 months with Dr Madilyn Fireman.    Problem List Items Addressed This Visit    None      Meds ordered this encounter  Medications  . metoprolol succinate (TOPROL-XL) 100 MG 24 hr tablet    Sig: Take 1 tablet (100 mg total) by mouth daily. Take with or  immediately following a meal.    Dispense:  90 tablet    Refill:  1  Follow-up: Return in about 3 months (around 02/11/2019) for HTN/DM with Dr Madilyn Fireman.Durene Romans, Monico Blitz, [place]

## 2018-11-13 ENCOUNTER — Encounter: Payer: Self-pay | Admitting: Family Medicine

## 2018-11-13 NOTE — Progress Notes (Signed)
BP looks great today! Agree with documentation as above.   Nani Gasser, MD

## 2019-02-13 ENCOUNTER — Ambulatory Visit: Payer: 59 | Admitting: Family Medicine

## 2019-02-16 ENCOUNTER — Telehealth: Payer: Self-pay

## 2019-02-16 NOTE — Telephone Encounter (Signed)
Okay to just go ahead and finish out what he has.  But we will need to find replacement afterwards.

## 2019-02-16 NOTE — Telephone Encounter (Addendum)
Gary Stevens called and states the pharmacy called him and states the manufacture of the Metformin he currently has is on a manufacture recall. He states he has about a month and a half left. He wanted to know if he should return the Metformin or is it ok to continue taking them until they are gone? Please advise.    Patient had an appointment this week that he cancelled. I advised him he is due for a follow up. 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 I advised he needed to reschedule as soon as possible.

## 2019-02-16 NOTE — Telephone Encounter (Signed)
Notified patient of MD instructions. No questions at the time. KG LPN

## 2019-03-07 ENCOUNTER — Ambulatory Visit (INDEPENDENT_AMBULATORY_CARE_PROVIDER_SITE_OTHER): Payer: 59 | Admitting: Family Medicine

## 2019-03-07 ENCOUNTER — Other Ambulatory Visit: Payer: Self-pay

## 2019-03-07 ENCOUNTER — Encounter: Payer: Self-pay | Admitting: Family Medicine

## 2019-03-07 VITALS — BP 125/77 | HR 66 | Ht 75.0 in | Wt 231.0 lb

## 2019-03-07 DIAGNOSIS — I10 Essential (primary) hypertension: Secondary | ICD-10-CM

## 2019-03-07 DIAGNOSIS — E1165 Type 2 diabetes mellitus with hyperglycemia: Secondary | ICD-10-CM | POA: Diagnosis not present

## 2019-03-07 LAB — POCT GLYCOSYLATED HEMOGLOBIN (HGB A1C): Hemoglobin A1C: 5.4 % (ref 4.0–5.6)

## 2019-03-07 MED ORDER — SITAGLIPTIN PHOSPHATE 100 MG PO TABS: 100.0000 mg | ORAL_TABLET | Freq: Every day | ORAL | 1 refills | Status: DC

## 2019-03-07 MED ORDER — PANTOPRAZOLE SODIUM 20 MG PO TBEC: 20.0000 mg | DELAYED_RELEASE_TABLET | Freq: Every day | ORAL | 3 refills | Status: DC

## 2019-03-07 MED ORDER — METFORMIN HCL 500 MG PO TABS: 500.0000 mg | ORAL_TABLET | Freq: Two times a day (BID) | ORAL | 1 refills | Status: DC

## 2019-03-07 MED ORDER — METFORMIN HCL 1000 MG PO TABS: 1000.0000 mg | ORAL_TABLET | Freq: Two times a day (BID) | ORAL | 1 refills | Status: DC

## 2019-03-07 NOTE — Assessment & Plan Note (Signed)
A1c looks phenomenal today.  He is currently on metformin extended release for total of 1000 mg daily.  Because of the recall on the medication we will go ahead and switch him to metformin 500 mg regular release twice a day.  So it is technically a decrease on his metformin dosing but with his A1c looking so great today that I think that is pretty reasonable.  He is done a great job and just sticking to his diet but says it has been a little bit harder with COVID to find places that are serving fresh food and not picking up fast food.

## 2019-03-07 NOTE — Progress Notes (Signed)
Established Patient Office Visit  Subjective:  Patient ID: Gary Stevens, male    DOB: 01/11/1966  Age: 53 y.o. MRN: [id number]  CC:  Chief Complaint  Patient presents with  . Diabetes    HPI MARVELOUS BOUWENS presents for   Diabetes - no hypoglycemic events. No wounds or sores that are not healing well. No increased thirst or urination. Checking glucose at home. Taking medications as prescribed without any side effects.  Hypertension- Pt denies chest pain, SOB, dizziness, or heart palpitations.  Taking meds as directed w/o problems.  Denies medication side effects.   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  Past Medical History:  Diagnosis Date  . ADHD 01/15/2009   Qualifier: Diagnosis of  By: Madilyn Fireman MD, Barnetta Chapel    . Anxiety state, unspecified 01/15/2009   Qualifier: Diagnosis of  By: Madilyn Fireman MD, Barnetta Chapel    . Essential hypertension, benign 06/06/2008   Qualifier: Diagnosis of  By: Madilyn Fireman MD, Barnetta Chapel    . HYPERTRIGLYCERIDEMIA 08/12/2009   Qualifier: Diagnosis of  By: Madilyn Fireman MD, Barnetta Chapel    . HYPERTROPHY PROSTATE W/UR OBST & OTH LUTS 01/15/2009   Qualifier: Diagnosis of  By: Madilyn Fireman MD, Barnetta Chapel    . INSOMNIA 06/06/2008   Qualifier: Diagnosis of  By: Madilyn Fireman MD, Barnetta Chapel    . KNEE PAIN 08/12/2009   Qualifier: Diagnosis of  By: Madilyn Fireman MD, Barnetta Chapel      No past surgical history on file.  Family History  Problem Relation Age of Onset  . Cirrhosis Father   . Diabetes Mellitus II Father   . Gallbladder disease Father   . Gallbladder disease Sister   . Gallbladder disease Son     Social History   Socioeconomic History  . Marital status: Single    Spouse name: Not on file  . Number of children: Not on file  . Years of education: Not on file  . Highest education level: Not on file  Occupational History  . Not on file  Social Needs  . Financial resource strain: Not on file  . Food insecurity    Worry:  Not on file    Inability: Not on file  . Transportation needs    Medical: Not on file    Non-medical: Not on file  Tobacco Use  . Smoking status: Current Every Day Smoker    Packs/day: 1.00    Years: 30.00    Pack years: 30.00    Types: Cigarettes  . Smokeless tobacco: Never Used  Substance and Sexual Activity  . Alcohol use: Not on file  . Drug use: Not on file  . Sexual activity: Not on file  Lifestyle  . Physical activity    Days per week: Not on file    Minutes per session: Not on file  . Stress: Not on file  Relationships  . Social Herbalist on phone: Not on file    Gets together: Not on file    Attends religious service: Not on file    Active member of club or organization: Not on file    Attends meetings of clubs or organizations: Not on file    Relationship status: Not on file  . Intimate partner violence    Fear of current or ex partner: Not on file    Emotionally abused: Not on file    Physically abused: Not on file    Forced sexual activity: Not on file  Other Topics Concern  . Not on file  Social History Narrative  . Not on file    Outpatient Medications Prior to Visit  Medication Sig Dispense Refill  . amphetamine-dextroamphetamine (ADDERALL) 20 MG tablet Take 20 mg by mouth 3 (three) times daily.  0  . Blood Glucose Monitoring Suppl (ONE TOUCH ULTRA 2) w/Device KIT See admin instructions.  0  . lisinopril-hydrochlorothiazide (PRINZIDE,ZESTORETIC) 20-25 MG tablet Take 1 tablet by mouth daily. 90 tablet 1  . metoprolol succinate (TOPROL-XL) 100 MG 24 hr tablet Take 1 tablet (100 mg total) by mouth daily. Take with or immediately following a meal. 90 tablet 1  . ONE TOUCH ULTRA TEST test strip Check blood sugar 4 times daily 300 each prn  . QUEtiapine (SEROQUEL) 25 MG tablet Take 25-100 mg by mouth at bedtime.  5  . metFORMIN (GLUCOPHAGE-XR) 500 MG 24 hr tablet Take 1 tablet (500 mg total) by mouth 2 (two) times daily. 180 tablet 0  .  pantoprazole (PROTONIX) 20 MG tablet Take 1 tablet (20 mg total) by mouth daily. 90 tablet 3  . sitaGLIPtin (JANUVIA) 100 MG tablet Take 1 tablet (100 mg total) by mouth daily. 90 tablet 1   No facility-administered medications prior to visit.     Allergies  Allergen Reactions  . Mirtazapine     Other reaction(s): Unknown    ROS Review of Systems    Objective:    Physical Exam  Constitutional: He is oriented to person, place, and time. He appears well-developed and well-nourished.  HENT:  Head: Normocephalic and atraumatic.  Cardiovascular: Normal rate, regular rhythm and normal heart sounds.  Pulmonary/Chest: Effort normal and breath sounds normal.  Neurological: He is alert and oriented to person, place, and time.  Skin: Skin is warm and dry.  Psychiatric: He has a normal mood and affect. His behavior is normal.    BP 125/77   Pulse 66   Ht _0  (1.905 m)   Wt 231 lb (104.8 kg)   SpO2 99%   BMI 28.87 kg/m  Wt Readings from Last 3 Encounters:  03/07/19 231 lb (104.8 kg)  11/11/18 229 lb (103.9 kg)  11/07/18 230 lb (104.3 kg)     Health Maintenance Due  Topic Date Due  . PNEUMOCOCCAL POLYSACCHARIDE VACCINE AGE 79-64 HIGH RISK  09/21/1967  . OPHTHALMOLOGY EXAM  09/21/1975  . HIV Screening  09/20/1980  . COLONOSCOPY  09/21/2015  . HEMOGLOBIN A1C  03/02/2019    There are no preventive care reminders to display for this patient.  Lab Results  Component Value Date   TSH 3.440 01/15/2009   Lab Results  Component Value Date   WBC 7.7 04/22/2018   HGB 14.6 04/22/2018   HCT 41 04/22/2018   PLT 155 04/22/2018   Lab Results  Component Value Date   NA 139 04/29/2018   K 4.5 04/29/2018   CO2 27 04/29/2018   GLUCOSE 116 (H) 04/29/2018   BUN 15 04/29/2018   CREATININE 1.21 04/29/2018   BILITOT 0.7 11/19/2017   ALKPHOS 224 (A) 04/22/2018   AST 31 04/22/2018   ALT 52 (A) 04/22/2018   PROT 6.6 11/19/2017   ALBUMIN 4.8 01/11/2017   CALCIUM 9.0 04/29/2018    Lab Results  Component Value Date   CHOL 168 04/29/2018   Lab Results  Component Value Date   HDL 40 (L) 04/29/2018   Lab Results  Component Value Date   LDLCALC 94 04/29/2018   Lab Results  Component Value  Date   TRIG 246 (H) 04/29/2018   Lab Results  Component Value Date   CHOLHDL 4.2 04/29/2018   Lab Results  Component Value Date   HGBA1C 5.4 03/07/2019      Assessment & Plan:   Problem List Items Addressed This Visit      Cardiovascular and Mediastinum   Essential hypertension - Primary    Well controlled. Continue current regimen. Follow up in  3-4 months.       Relevant Orders   COMPLETE METABOLIC PANEL WITH GFR   PSA     Endocrine   Uncontrolled type 2 diabetes mellitus with hyperglycemia (HCC)    A1c looks phenomenal today.  He is currently on metformin extended release for total of 1000 mg daily.  Because of the recall on the medication we will go ahead and switch him to metformin 500 mg regular release twice a day.  So it is technically a decrease on his metformin dosing but with his A1c looking so great today that I think that is pretty reasonable.  He is done a great job and just sticking to his diet but says it has been a little bit harder with COVID to find places that are serving fresh food and not picking up fast food.      Relevant Medications   sitaGLIPtin (JANUVIA) 100 MG tablet   metFORMIN (GLUCOPHAGE) 500 MG tablet   Other Relevant Orders   POCT glycosylated hemoglobin (Hb A1C) (Completed)   COMPLETE METABOLIC PANEL WITH GFR   PSA     Also reminded him again to please schedule his eye exam.  I explained that even though he has not had any significant vision changes that diabetes can affect the blood vessels in the back of his eye and encouraged him to get a dilated eye exam.   Meds ordered this encounter  Medications  . sitaGLIPtin (JANUVIA) 100 MG tablet    Sig: Take 1 tablet (100 mg total) by mouth daily.    Dispense:  90 tablet     Refill:  1  . pantoprazole (PROTONIX) 20 MG tablet    Sig: Take 1 tablet (20 mg total) by mouth daily.    Dispense:  90 tablet    Refill:  3  . DISCONTD: metFORMIN (GLUCOPHAGE) 1000 MG tablet    Sig: Take 1 tablet (1,000 mg total) by mouth 2 (two) times daily with a meal.    Dispense:  180 tablet    Refill:  1  . metFORMIN (GLUCOPHAGE) 500 MG tablet    Sig: Take 1 tablet (500 mg total) by mouth 2 (two) times daily with a meal.    Dispense:  180 tablet    Refill:  1    Cancel rx for 101m    Follow-up: Return in about 4 months (around 07/08/2019) for Diabetes follow-up.    CBeatrice Lecher MD

## 2019-03-07 NOTE — Assessment & Plan Note (Signed)
Well controlled. Continue current regimen. Follow up in  3-4 months.  

## 2019-05-16 ENCOUNTER — Encounter: Payer: Self-pay | Admitting: Adult Health

## 2019-05-16 ENCOUNTER — Other Ambulatory Visit: Payer: Self-pay

## 2019-05-16 ENCOUNTER — Ambulatory Visit (INDEPENDENT_AMBULATORY_CARE_PROVIDER_SITE_OTHER): Payer: 59 | Admitting: Adult Health

## 2019-05-16 DIAGNOSIS — G4700 Insomnia, unspecified: Secondary | ICD-10-CM | POA: Diagnosis not present

## 2019-05-16 DIAGNOSIS — F331 Major depressive disorder, recurrent, moderate: Secondary | ICD-10-CM

## 2019-05-16 DIAGNOSIS — F411 Generalized anxiety disorder: Secondary | ICD-10-CM

## 2019-05-16 DIAGNOSIS — F909 Attention-deficit hyperactivity disorder, unspecified type: Secondary | ICD-10-CM

## 2019-05-16 MED ORDER — AMPHETAMINE-DEXTROAMPHETAMINE 20 MG PO TABS: ORAL_TABLET | ORAL | 0 refills | Status: DC

## 2019-05-16 MED ORDER — QUETIAPINE FUMARATE 50 MG PO TABS: 50.0000 mg | ORAL_TABLET | Freq: Two times a day (BID) | ORAL | 1 refills | Status: DC

## 2019-05-16 NOTE — Progress Notes (Signed)
DEEP BONAWITZ [id number] 12-Apr-1966 53 y.o.  Virtual Visit via Telephone Note  I connected with pt on 05/16/19 at  3:00 PM EDT by telephone and verified that I am speaking with the correct person using two identifiers.   I discussed the limitations, risks, security and privacy concerns of performing an evaluation and management service by telephone and the availability of in person appointments. I also discussed with the patient that there may be a patient responsible charge related to this service. The patient expressed understanding and agreed to proceed.   I discussed the assessment and treatment plan with the patient. The patient was provided an opportunity to ask questions and all were answered. The patient agreed with the plan and demonstrated an understanding of the instructions.   The patient was advised to call back or seek an in-person evaluation if the symptoms worsen or if the condition fails to improve as anticipated.  I provided 30 minutes of non-face-to-face time during this encounter.  The patient was located at home.  The provider was located at [place].   Aloha Gell, NP   Subjective:   Patient ID:  Gary Stevens is a 53 y.o. (DOB 06/07/1966) male.  Chief Complaint: No chief complaint on file.   HPI ISIDRO MONKS presents for follow-up of anxiety, depression, ADHD, and insomnia.   Describes mood today as "ok". Pleasant.  Mood symptoms - denies depression, anxiety, and irritability. Stating "I'm doing pretty good". Stable interest and motivation. Taking medications as prescribed.  Energy levels stable. Active, does not have a regular exercise routine. 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 Enjoys some usual interests and activities. Spending time with family - fiance. Appetite adequate. Weight stable. Sleeps well most nights with Seroquel. Averages 7 to 8 hours. Focus and concentration stable. Completing tasks.  Managing aspects of household.  Denies SI or HI. Denies AH or VH.   Review of Systems:  Review of Systems  Musculoskeletal: Negative for gait problem.  Neurological: Negative for tremors.  Psychiatric/Behavioral:       Please refer to HPI    Medications: I have reviewed the patient's current medications.  Current Outpatient Medications  Medication Sig Dispense Refill  . amphetamine-dextroamphetamine (ADDERALL) 20 MG tablet Take 20 mg by mouth 3 (three) times daily.  0  . amphetamine-dextroamphetamine (ADDERALL) 20 MG tablet Take one tablet three times daily. 90 tablet 0  . amphetamine-dextroamphetamine (ADDERALL) 20 MG tablet Take one tablet three times a day. 90 tablet 0  . amphetamine-dextroamphetamine (ADDERALL) 20 MG tablet Take one tablet three times daily. 90 tablet 0  . Blood Glucose Monitoring Suppl (ONE TOUCH ULTRA 2) w/Device KIT See admin instructions.  0  . lisinopril-hydrochlorothiazide (PRINZIDE,ZESTORETIC) 20-25 MG tablet Take 1 tablet by mouth daily. 90 tablet 1  . metFORMIN (GLUCOPHAGE) 500 MG tablet Take 1 tablet (500 mg total) by mouth 2 (two) times daily with a meal. 180 tablet 1  . metoprolol succinate (TOPROL-XL) 100 MG 24 hr tablet Take 1 tablet (100 mg total) by mouth daily. Take with or immediately following a meal. 90 tablet 1  . ONE TOUCH ULTRA TEST test strip Check blood sugar 4 times daily 300 each prn  . pantoprazole (PROTONIX) 20 MG tablet Take 1 tablet (20 mg total) by mouth daily. 90 tablet 3  . QUEtiapine (SEROQUEL) 25 MG tablet Take 25-100 mg by mouth at bedtime.  5  . QUEtiapine (SEROQUEL) 50 MG tablet Take  1 tablet (50 mg total) by mouth 2 (two) times daily. 180 tablet 1  . sitaGLIPtin (JANUVIA) 100 MG tablet Take 1 tablet (100 mg total) by mouth daily. 90 tablet 1   No current facility-administered medications for this visit.     Medication Side Effects: None  Allergies:  Allergies  Allergen Reactions  . Mirtazapine     Other reaction(s):  Unknown    Past Medical History:  Diagnosis Date  . ADHD 01/15/2009   Qualifier: Diagnosis of  By: Madilyn Fireman MD, Barnetta Chapel    . Anxiety state, unspecified 01/15/2009   Qualifier: Diagnosis of  By: Madilyn Fireman MD, Barnetta Chapel    . Essential hypertension, benign 06/06/2008   Qualifier: Diagnosis of  By: Madilyn Fireman MD, Barnetta Chapel    . HYPERTRIGLYCERIDEMIA 08/12/2009   Qualifier: Diagnosis of  By: Madilyn Fireman MD, Barnetta Chapel    . HYPERTROPHY PROSTATE W/UR OBST & OTH LUTS 01/15/2009   Qualifier: Diagnosis of  By: Madilyn Fireman MD, Barnetta Chapel    . INSOMNIA 06/06/2008   Qualifier: Diagnosis of  By: Madilyn Fireman MD, Barnetta Chapel    . KNEE PAIN 08/12/2009   Qualifier: Diagnosis of  By: Madilyn Fireman MD, Idelle Crouch History  Problem Relation Age of Onset  . Cirrhosis Father   . Diabetes Mellitus II Father   . Gallbladder disease Father   . Gallbladder disease Sister   . Gallbladder disease Son     Social History   Socioeconomic History  . Marital status: Single    Spouse name: Not on file  . Number of children: Not on file  . Years of education: Not on file  . Highest education level: Not on file  Occupational History  . Not on file  Social Needs  . Financial resource strain: Not on file  . Food insecurity    Worry: Not on file    Inability: Not on file  . Transportation needs    Medical: Not on file    Non-medical: Not on file  Tobacco Use  . Smoking status: Current Every Day Smoker    Packs/day: 1.00    Years: 30.00    Pack years: 30.00    Types: Cigarettes  . Smokeless tobacco: Never Used  Substance and Sexual Activity  . Alcohol use: Not on file  . Drug use: Not on file  . Sexual activity: Not on file  Lifestyle  . Physical activity    Days per week: Not on file    Minutes per session: Not on file  . Stress: Not on file  Relationships  . Social Herbalist on phone: Not on file    Gets together: Not on file    Attends religious service: Not on file    Active member of  club or organization: Not on file    Attends meetings of clubs or organizations: Not on file    Relationship status: Not on file  . Intimate partner violence    Fear of current or ex partner: Not on file    Emotionally abused: Not on file    Physically abused: Not on file    Forced sexual activity: Not on file  Other Topics Concern  . Not on file  Social History Narrative  . Not on file    Past Medical History, Surgical history, Social history, and Family history were reviewed and updated as appropriate.   Please see review of systems for further details on the patient's review from today.   Objective:  Physical Exam:  There were no vitals taken for this visit.  Physical Exam Neurological:     Mental Status: He is alert and oriented to person, place, and time.     Cranial Nerves: No dysarthria.  Psychiatric:        Attention and Perception: Attention normal.        Mood and Affect: Mood normal.        Speech: Speech normal.        Behavior: Behavior is cooperative.        Thought Content: Thought content normal. Thought content is not paranoid or delusional. Thought content does not include homicidal or suicidal ideation. Thought content does not include homicidal or suicidal plan.        Cognition and Memory: Cognition and memory normal.        Judgment: Judgment normal.     Lab Review:     Component Value Date/Time   NA 139 04/29/2018 0921   NA 137 04/22/2018   K 4.5 04/29/2018 0921   CL 105 04/29/2018 0921   CO2 27 04/29/2018 0921   GLUCOSE 116 (H) 04/29/2018 0921   BUN 15 04/29/2018 0921   BUN 12 04/22/2018   CREATININE 1.21 04/29/2018 0921   CALCIUM 9.0 04/29/2018 0921   PROT 6.6 11/19/2017 0923   ALBUMIN 4.8 01/11/2017 1129   AST 31 04/22/2018   ALT 52 (A) 04/22/2018   ALKPHOS 224 (A) 04/22/2018   BILITOT 0.7 11/19/2017 0923   GFRNONAA 68 04/29/2018 0921   GFRAA 79 04/29/2018 0921       Component Value Date/Time   WBC 7.7 04/22/2018   HGB 14.6  04/22/2018   HCT 41 04/22/2018   PLT 155 04/22/2018    No results found for: POCLITH, LITHIUM   No results found for: PHENYTOIN, PHENOBARB, VALPROATE, CBMZ   .res Assessment: Plan:    Diagnoses and all orders for this visit:  Generalized anxiety disorder  Major depressive disorder, recurrent episode, moderate (HCC) -     QUEtiapine (SEROQUEL) 50 MG tablet; Take 1 tablet (50 mg total) by mouth 2 (two) times daily.  Attention deficit hyperactivity disorder (ADHD), unspecified ADHD type -     amphetamine-dextroamphetamine (ADDERALL) 20 MG tablet; Take one tablet three times daily. -     amphetamine-dextroamphetamine (ADDERALL) 20 MG tablet; Take one tablet three times a day. -     amphetamine-dextroamphetamine (ADDERALL) 20 MG tablet; Take one tablet three times daily.  Insomnia, unspecified type -     QUEtiapine (SEROQUEL) 50 MG tablet; Take 1 tablet (50 mg total) by mouth 2 (two) times daily.    Please see After Visit Summary for patient specific instructions.  Future Appointments  Date Time Provider [place]  07/10/2019  2:20 PM Hali Marry, MD [facility] None    No orders of the defined types were placed in this encounter.     -------------------------------

## 2019-05-18 ENCOUNTER — Telehealth: Payer: Self-pay

## 2019-05-18 NOTE — Telephone Encounter (Signed)
Prior authorization submitted and approved for Amphetamine-dextroamphetamine 20 mg through [employer] effective 05/18/2019-05/17/2020

## 2019-05-22 ENCOUNTER — Other Ambulatory Visit: Payer: Self-pay

## 2019-05-22 MED ORDER — LISINOPRIL-HYDROCHLOROTHIAZIDE 20-25 MG PO TABS: 1.0000 | ORAL_TABLET | Freq: Every day | ORAL | 1 refills | Status: DC

## 2019-05-22 MED ORDER — METOPROLOL SUCCINATE ER 100 MG PO TB24: 100.0000 mg | ORAL_TABLET | Freq: Every day | ORAL | 1 refills | Status: DC

## 2019-06-02 ENCOUNTER — Encounter: Payer: Self-pay | Admitting: Family Medicine

## 2019-06-02 ENCOUNTER — Ambulatory Visit (INDEPENDENT_AMBULATORY_CARE_PROVIDER_SITE_OTHER): Payer: 59 | Admitting: Family Medicine

## 2019-06-02 DIAGNOSIS — K0889 Other specified disorders of teeth and supporting structures: Secondary | ICD-10-CM | POA: Diagnosis not present

## 2019-06-02 MED ORDER — AMOXICILLIN-POT CLAVULANATE 875-125 MG PO TABS: 1.0000 | ORAL_TABLET | Freq: Two times a day (BID) | ORAL | 0 refills | Status: DC

## 2019-06-02 NOTE — Progress Notes (Signed)
Virtual Visit via Telephone Note  I connected with Gary Stevens on 06/02/19 at  1:00 PM EDT by telephone and verified that I am speaking with the correct person using two identifiers.   I discussed the limitations, risks, security and privacy concerns of performing an evaluation and management service by telephone and the availability of in person appointments. I also discussed with the patient that there may be a patient responsible charge related to this service. The patient expressed understanding and agreed to proceed.   Subjective:    CC: Dental pain.   HPI:  53 yo male w/ tooth pain x3-4 days L sided back top tooth. Hurts/sensitive if air gets to it or heat/cold. Denies swelling. Never had problems with this tooth. No swelling of the gum. He No pus or discharge.  Says even when air hits it it hurts. Using oragel and tylenol for pain. He had another tooth near this location removed last year. No fever sweats or chills. Using advil and tylenol for pain.    Hx of old jaw injury.    Past medical history, Surgical history, Family history not pertinant except as noted below, Social history, Allergies, and medications have been entered into the medical record, reviewed, and corrections made.   Review of Systems: No fevers, chills, night sweats, weight loss, chest pain, or shortness of breath.   Objective:    General: Speaking clearly in complete sentences without any shortness of breath.  Alert and oriented x3.  Normal judgment. No apparent acute distress.    Impression and Recommendations:   Dental pain-discussed treatment.  Been going to put him on an antibiotic for now with the possibility of abscess as he is noticing a little bit of bad tasting discharge from around the tooth.  We also discussed that definitive treatment would be to see his dentist.  That might end in tooth extraction but certainly that would be up to them in their evaluation.  Did explain to him that the  antibiotics would only temporarily improve his pain and swelling and would not be a permanent treatment to this.  He does understand.  He request that we go ahead and make referral to a dentist for him.  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       I discussed the assessment and treatment plan with the patient. The patient was provided an opportunity to ask questions and all were answered. The patient agreed with the plan and demonstrated an understanding of the instructions.   The patient was advised to call back or seek an in-person evaluation if the symptoms worsen or if the condition fails to improve as anticipated.  I provided 15 minutes of non-face-to-face time during this encounter.   Beatrice Lecher, MD

## 2019-06-02 NOTE — Progress Notes (Signed)
x3-4 days L sided back top tooth. Hurts/sensitive if air gets to it or heat/cold. Denies swelling.   Using oragel and tylenol for pain.Gary Stevens, Lahoma Crocker, CMA

## 2019-06-23 ENCOUNTER — Telehealth: Payer: Self-pay | Admitting: Adult Health

## 2019-06-23 NOTE — Telephone Encounter (Signed)
Pharmacy called in [place] and left a message wanting to know if it is ok to fill his adderall. Please give them a call at [phone].

## 2019-06-26 ENCOUNTER — Telehealth: Payer: Self-pay | Admitting: Adult Health

## 2019-06-26 NOTE — Telephone Encounter (Signed)
Noted  

## 2019-06-26 NOTE — Telephone Encounter (Signed)
Discussed.

## 2019-06-26 NOTE — Telephone Encounter (Signed)
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  Patient would like a call when letter is faxed.

## 2019-06-26 NOTE — Telephone Encounter (Signed)
I did not see this Friday.  Barnett Applebaum are you okay with it?

## 2019-07-10 ENCOUNTER — Ambulatory Visit: Payer: 59 | Admitting: Family Medicine

## 2019-08-07 ENCOUNTER — Other Ambulatory Visit: Payer: Self-pay

## 2019-08-07 ENCOUNTER — Encounter: Payer: Self-pay | Admitting: Adult Health

## 2019-08-07 ENCOUNTER — Telehealth: Payer: Self-pay | Admitting: Adult Health

## 2019-08-07 ENCOUNTER — Ambulatory Visit (INDEPENDENT_AMBULATORY_CARE_PROVIDER_SITE_OTHER): Payer: 59 | Admitting: Adult Health

## 2019-08-07 DIAGNOSIS — F411 Generalized anxiety disorder: Secondary | ICD-10-CM

## 2019-08-07 DIAGNOSIS — G4700 Insomnia, unspecified: Secondary | ICD-10-CM | POA: Diagnosis not present

## 2019-08-07 DIAGNOSIS — F909 Attention-deficit hyperactivity disorder, unspecified type: Secondary | ICD-10-CM

## 2019-08-07 DIAGNOSIS — F331 Major depressive disorder, recurrent, moderate: Secondary | ICD-10-CM | POA: Diagnosis not present

## 2019-08-07 MED ORDER — AMPHETAMINE-DEXTROAMPHETAMINE 20 MG PO TABS: ORAL_TABLET | ORAL | 0 refills | Status: DC

## 2019-08-07 MED ORDER — QUETIAPINE FUMARATE 100 MG PO TABS: 100.0000 mg | ORAL_TABLET | Freq: Two times a day (BID) | ORAL | 1 refills | Status: DC

## 2019-08-07 MED ORDER — QUETIAPINE FUMARATE 50 MG PO TABS: 50.0000 mg | ORAL_TABLET | Freq: Two times a day (BID) | ORAL | 1 refills | Status: DC

## 2019-08-07 NOTE — Addendum Note (Signed)
Addended by: Aloha Gell on: 08/07/2019 08:36 AM   Modules accepted: Orders

## 2019-08-07 NOTE — Telephone Encounter (Signed)
Prescriptions were mailed to patient on 08/07/19 per Gina's instructions

## 2019-08-07 NOTE — Progress Notes (Signed)
Gary Stevens [id number] July 06, 1966 53 y.o.  Virtual Visit via Telephone Note  I connected with pt on 08/07/19 at  8:20 AM EST by telephone and verified that I am speaking with the correct person using two identifiers.   I discussed the limitations, risks, security and privacy concerns of performing an evaluation and management service by telephone and the availability of in person appointments. I also discussed with the patient that there may be a patient responsible charge related to this service. The patient expressed understanding and agreed to proceed.   I discussed the assessment and treatment plan with the patient. The patient was provided an opportunity to ask questions and all were answered. The patient agreed with the plan and demonstrated an understanding of the instructions.   The patient was advised to call back or seek an in-person evaluation if the symptoms worsen or if the condition fails to improve as anticipated.  I provided 30 minutes of non-face-to-face time during this encounter.  The patient was located at home.  The provider was located at [place].   Aloha Gell, NP   Subjective:   Patient ID:  Gary Stevens is a 53 y.o. (DOB [birth year]) male.  Chief Complaint:  Chief Complaint  Patient presents with  . Anxiety  . Depression  . ADHD  . Insomnia    HPI Gary Stevens presents for follow-up of anxiety, depression, insomnia, ADHD.  Describes mood today as "alright". Pleasant.  Mood symptoms - denies depression, anxiety, and irritability. Stating "things are going pretty good". 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 Looking forward to getting back home. Plans to be home for Christmas. Stable interest and motivation. Taking medications as prescribed.  Energy levels stable. Active, does not have a regular exercise routine. Works full-time - Administrator.  Enjoys some usual interests and  activities. Lives with fiance. Spending time with family when home.  Appetite adequate. Weight stable. Sleeps better some nights than others - using Seroquel. Averages 6 to 8 hours. Focus and concentration difficulties - has been getting medications from different manufacturers and don't feel like they work as well for him. Completing tasks. Managing aspects of household.  Denies SI or HI. Denies AH or VH.   Review of Systems:  Review of Systems  Musculoskeletal: Negative for gait problem.  Neurological: Negative for tremors.  Psychiatric/Behavioral:       Please refer to HPI    Medications: I have reviewed the patient's current medications.  Current Outpatient Medications  Medication Sig Dispense Refill  . amoxicillin-clavulanate (AUGMENTIN) 875-125 MG tablet Take 1 tablet by mouth 2 (two) times daily. 28 tablet 0  . amphetamine-dextroamphetamine (ADDERALL) 20 MG tablet Take 20 mg by mouth 3 (three) times daily.  0  . amphetamine-dextroamphetamine (ADDERALL) 20 MG tablet Take one tablet three times daily. 90 tablet 0  . [START ON 09/04/2019] amphetamine-dextroamphetamine (ADDERALL) 20 MG tablet Take one tablet three times a day. 90 tablet 0  . [START ON 10/02/2019] amphetamine-dextroamphetamine (ADDERALL) 20 MG tablet Take one tablet three times daily. 90 tablet 0  . Blood Glucose Monitoring Suppl (ONE TOUCH ULTRA 2) w/Device KIT See admin instructions.  0  . lisinopril-hydrochlorothiazide (ZESTORETIC) 20-25 MG tablet Take 1 tablet by mouth daily. 90 tablet 1  . metFORMIN (GLUCOPHAGE) 500 MG tablet Take 1 tablet (500 mg total) by mouth 2 (two) times daily with a meal. 180 tablet 1  . metoprolol succinate (TOPROL-XL) 100 MG  24 hr tablet Take 1 tablet (100 mg total) by mouth daily. Take with or immediately following a meal. 90 tablet 1  . ONE TOUCH ULTRA TEST test strip Check blood sugar 4 times daily 300 each prn  . pantoprazole (PROTONIX) 20 MG tablet Take 1 tablet (20 mg total) by mouth  daily. 90 tablet 3  . QUEtiapine (SEROQUEL) 100 MG tablet Take 1 tablet (100 mg total) by mouth 2 (two) times daily. Cancel previous prescription for the 75m tablets. 180 tablet 1   No current facility-administered medications for this visit.     Medication Side Effects: None  Allergies:  Allergies  Allergen Reactions  . Mirtazapine     Other reaction(s): Unknown    Past Medical History:  Diagnosis Date  . ADHD 01/15/2009   Qualifier: Diagnosis of  By: MMadilyn FiremanMD, CBarnetta Chapel   . Anxiety state, unspecified 01/15/2009   Qualifier: Diagnosis of  By: MMadilyn FiremanMD, CBarnetta Chapel   . Essential hypertension, benign 06/06/2008   Qualifier: Diagnosis of  By: MMadilyn FiremanMD, CBarnetta Chapel   . HYPERTRIGLYCERIDEMIA 08/12/2009   Qualifier: Diagnosis of  By: MMadilyn FiremanMD, CBarnetta Chapel   . HYPERTROPHY PROSTATE W/UR OBST & OTH LUTS 01/15/2009   Qualifier: Diagnosis of  By: MMadilyn FiremanMD, CBarnetta Chapel   . INSOMNIA 06/06/2008   Qualifier: Diagnosis of  By: MMadilyn FiremanMD, CBarnetta Chapel   . KNEE PAIN 08/12/2009   Qualifier: Diagnosis of  By: MMadilyn FiremanMD, CIdelle CrouchHistory  Problem Relation Age of Onset  . Cirrhosis Father   . Diabetes Mellitus II Father   . Gallbladder disease Father   . Gallbladder disease Sister   . Gallbladder disease Son     Social History   Socioeconomic History  . Marital status: Single    Spouse name: Not on file  . Number of children: Not on file  . Years of education: Not on file  . Highest education level: Not on file  Occupational History  . Not on file  Social Needs  . Financial resource strain: Not on file  . Food insecurity    Worry: Not on file    Inability: Not on file  . Transportation needs    Medical: Not on file    Non-medical: Not on file  Tobacco Use  . Smoking status: Current Every Day Smoker    Packs/day: 1.00    Years: 30.00    Pack years: 30.00    Types: Cigarettes  . Smokeless tobacco: Never Used  Substance and Sexual Activity  . Alcohol  use: Not on file  . Drug use: Not on file  . Sexual activity: Not on file  Lifestyle  . Physical activity    Days per week: Not on file    Minutes per session: Not on file  . Stress: Not on file  Relationships  . Social cHerbaliston phone: Not on file    Gets together: Not on file    Attends religious service: Not on file    Active member of club or organization: Not on file    Attends meetings of clubs or organizations: Not on file    Relationship status: Not on file  . Intimate partner violence    Fear of current or ex partner: Not on file    Emotionally abused: Not on file    Physically abused: Not on file    Forced sexual activity: Not on file  Other Topics  Concern  . Not on file  Social History Narrative  . Not on file    Past Medical History, Surgical history, Social history, and Family history were reviewed and updated as appropriate.   Please see review of systems for further details on the patient's review from today.   Objective:   Physical Exam:  There were no vitals taken for this visit.  Physical Exam Constitutional:      General: He is not in acute distress.    Appearance: He is well-developed.  Musculoskeletal:        General: No deformity.  Neurological:     Mental Status: He is alert and oriented to person, place, and time.     Coordination: Coordination normal.  Psychiatric:        Attention and Perception: Attention and perception normal. He does not perceive auditory or visual hallucinations.        Mood and Affect: Mood normal. Mood is not anxious or depressed. Affect is not labile, blunt, angry or inappropriate.        Speech: Speech normal.        Behavior: Behavior normal.        Thought Content: Thought content normal. Thought content is not paranoid or delusional. Thought content does not include homicidal or suicidal ideation. Thought content does not include homicidal or suicidal plan.        Cognition and Memory: Cognition and  memory normal.        Judgment: Judgment normal.     Comments: Insight intact     Lab Review:     Component Value Date/Time   NA 139 04/29/2018 0921   NA 137 04/22/2018   K 4.5 04/29/2018 0921   CL 105 04/29/2018 0921   CO2 27 04/29/2018 0921   GLUCOSE 116 (H) 04/29/2018 0921   BUN 15 04/29/2018 0921   BUN 12 04/22/2018   CREATININE 1.21 04/29/2018 0921   CALCIUM 9.0 04/29/2018 0921   PROT 6.6 11/19/2017 0923   ALBUMIN 4.8 01/11/2017 1129   AST 31 04/22/2018   ALT 52 (A) 04/22/2018   ALKPHOS 224 (A) 04/22/2018   BILITOT 0.7 11/19/2017 0923   GFRNONAA 68 04/29/2018 0921   GFRAA 79 04/29/2018 0921       Component Value Date/Time   WBC 7.7 04/22/2018   HGB 14.6 04/22/2018   HCT 41 04/22/2018   PLT 155 04/22/2018    No results found for: POCLITH, LITHIUM   No results found for: PHENYTOIN, PHENOBARB, VALPROATE, CBMZ   .res Assessment: Plan:    Plan:  1. Adderall 65m TID 2. Seroquel 549m- 1 to 2 at hs  RTC 3 months  Patient advised to contact office with any questions, adverse effects, or acute worsening in signs and symptoms.  RaChouas seen today for anxiety, depression, adhd and insomnia.  Diagnoses and all orders for this visit:  Insomnia, unspecified type -     Discontinue: QUEtiapine (SEROQUEL) 50 MG tablet; Take 1 tablet (50 mg total) by mouth 2 (two) times daily. -     QUEtiapine (SEROQUEL) 100 MG tablet; Take 1 tablet (100 mg total) by mouth 2 (two) times daily. Cancel previous prescription for the 5057mablets.  Attention deficit hyperactivity disorder (ADHD), unspecified ADHD type -     amphetamine-dextroamphetamine (ADDERALL) 20 MG tablet; Take one tablet three times daily. -     amphetamine-dextroamphetamine (ADDERALL) 20 MG tablet; Take one tablet three times a day. -     amphetamine-dextroamphetamine (  ADDERALL) 20 MG tablet; Take one tablet three times daily.  Major depressive disorder, recurrent episode, moderate (HCC) -     Discontinue:  QUEtiapine (SEROQUEL) 50 MG tablet; Take 1 tablet (50 mg total) by mouth 2 (two) times daily. -     QUEtiapine (SEROQUEL) 100 MG tablet; Take 1 tablet (100 mg total) by mouth 2 (two) times daily. Cancel previous prescription for the 48m tablets.  Generalized anxiety disorder    Please see After Visit Summary for patient specific instructions.  No future appointments.  No orders of the defined types were placed in this encounter.     -------------------------------

## 2019-08-17 ENCOUNTER — Ambulatory Visit: Payer: 59 | Admitting: Family Medicine

## 2019-08-20 IMAGING — US US SCROTUM W/ DOPPLER COMPLETE
1 series · 14 of 25 positions shown · non-contrast
Comparison: None.

CLINICAL DATA: Right testicular swelling for 1 month without known
injury.

EXAM:
SCROTAL ULTRASOUND
DOPPLER ULTRASOUND OF THE TESTICLES
TECHNIQUE: Complete ultrasound examination of the testicles, epididymis, and
other scrotal structures was performed. Color and spectral Doppler
ultrasound were also utilized to evaluate blood flow to the
testicles.

[Series 1: us scrotum w/ doppler complete · 0.07mm/px · 14 of 66 slices shown]
[im 1/66]
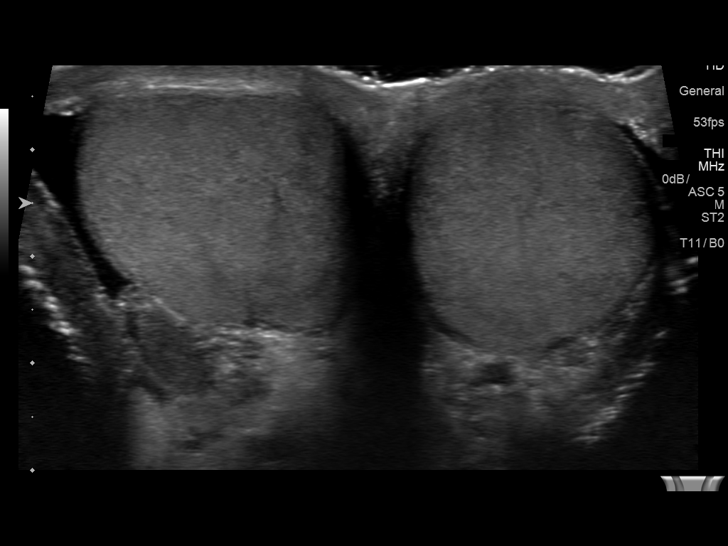
[im 6/66]
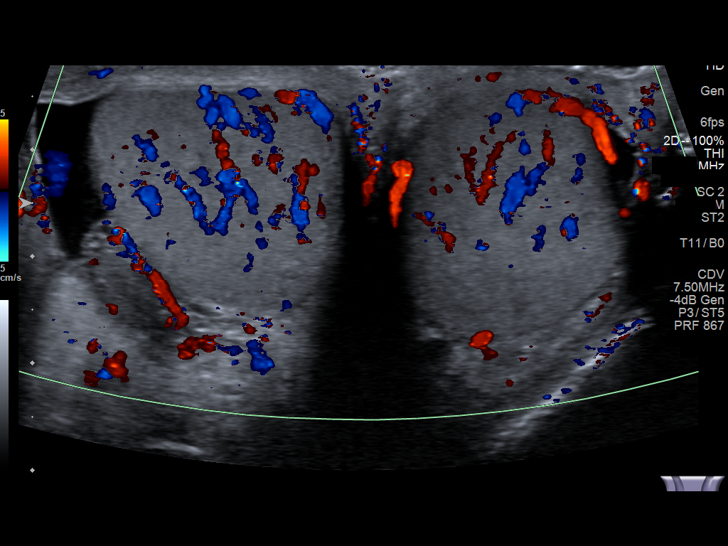
[im 11/66]
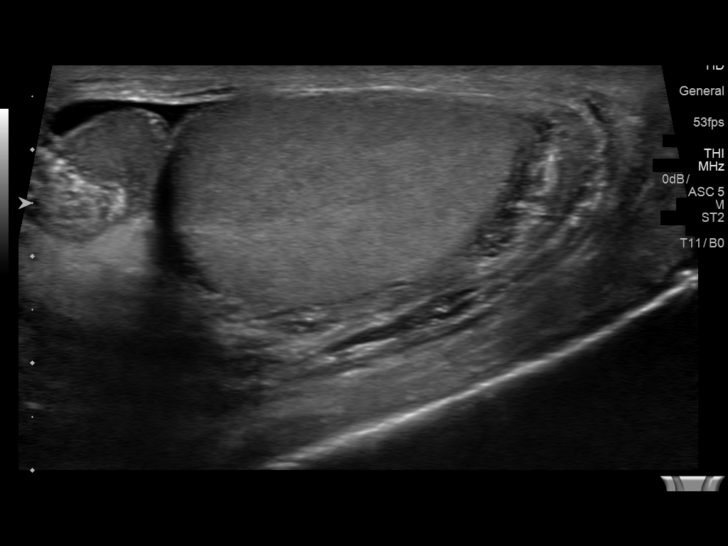
[im 17/66]
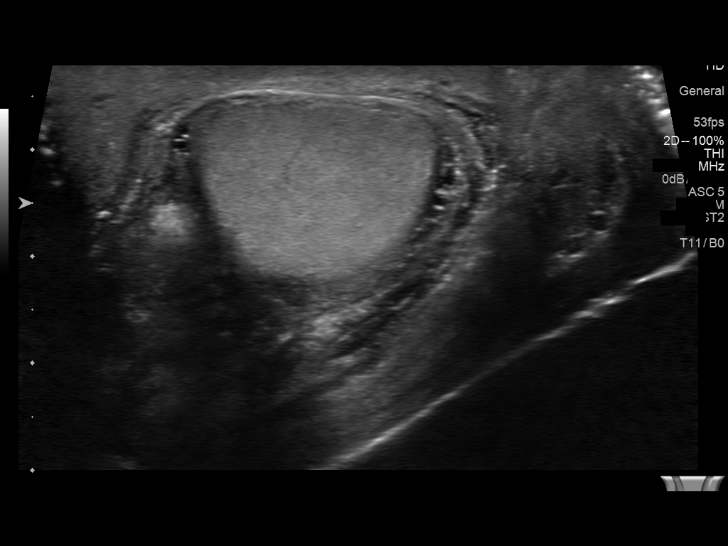
[im 22/66]
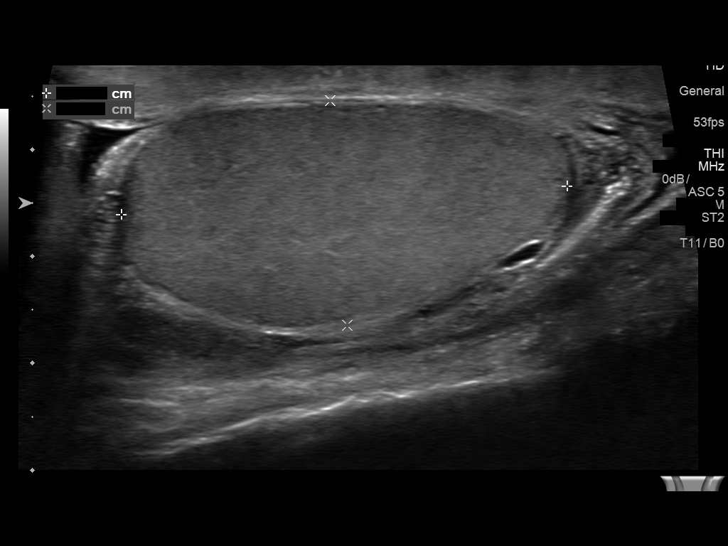
[im 25/66]
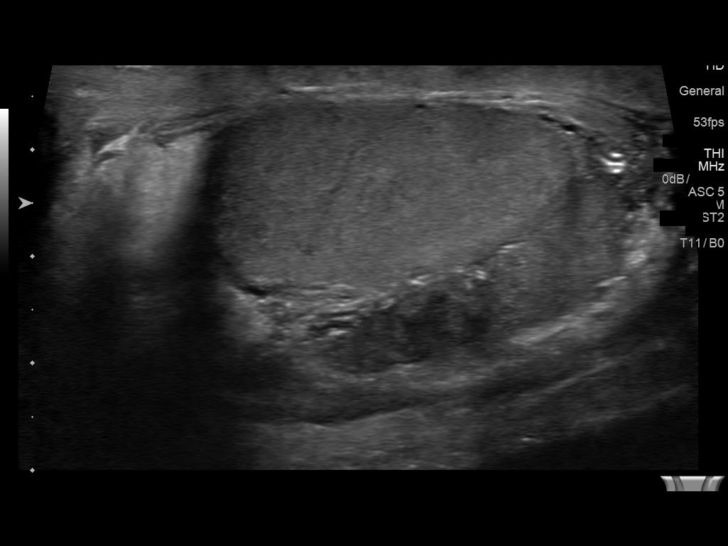
[im 30/66]
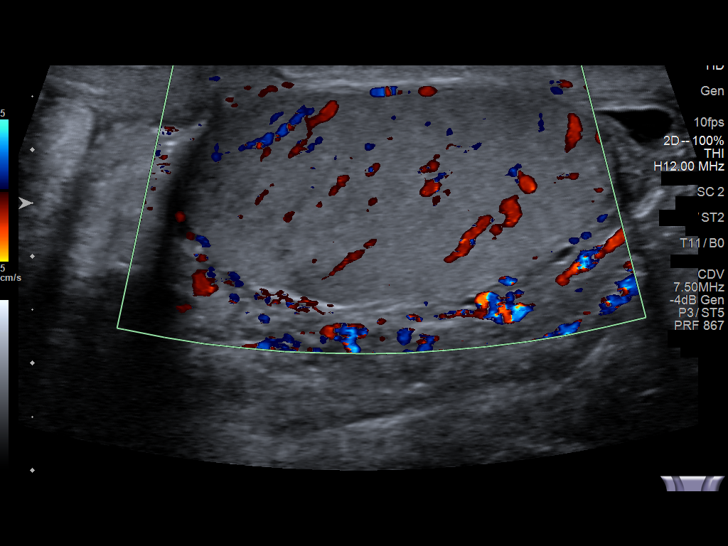
[im 36/66]
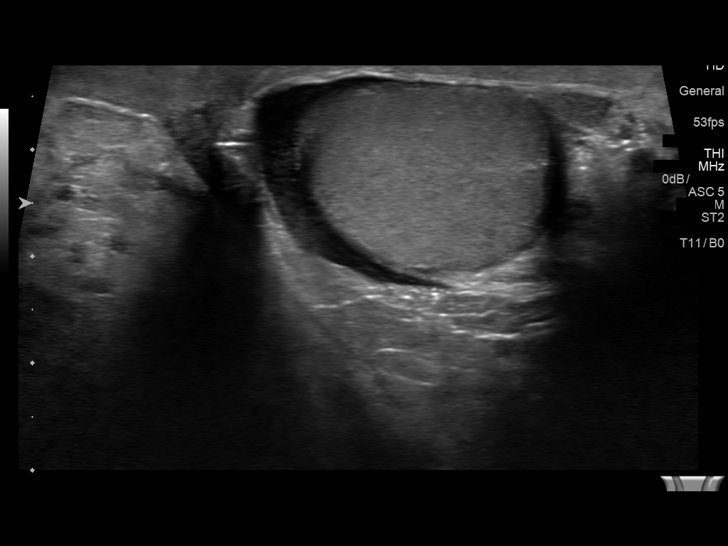
[im 41/66]
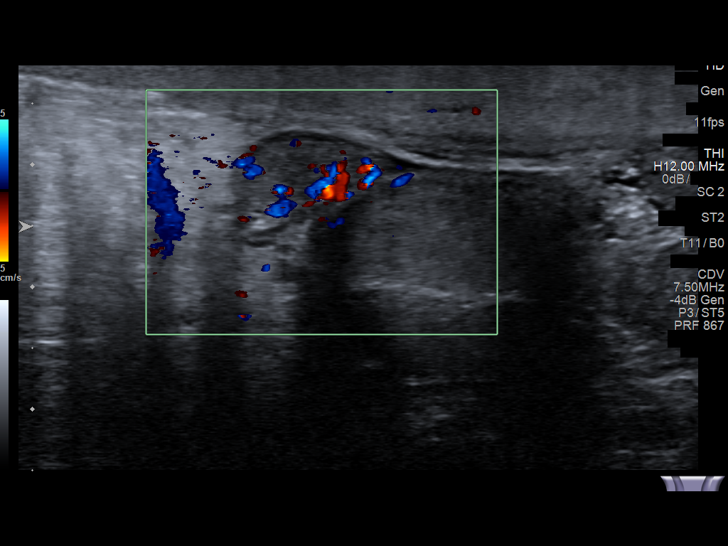
[im 44/66]
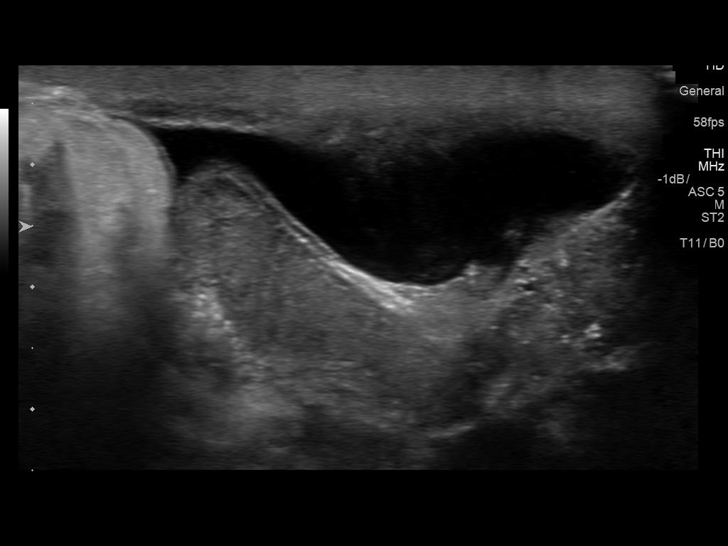
[im 49/66]
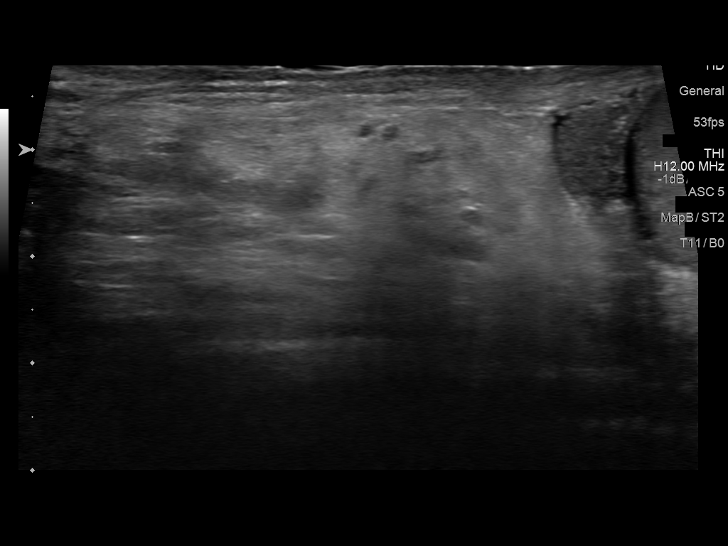
[im 55/66]
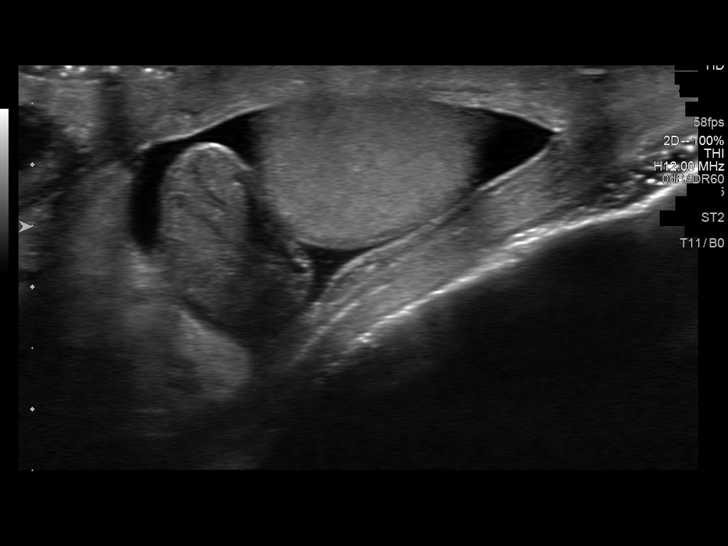
[im 60/66]
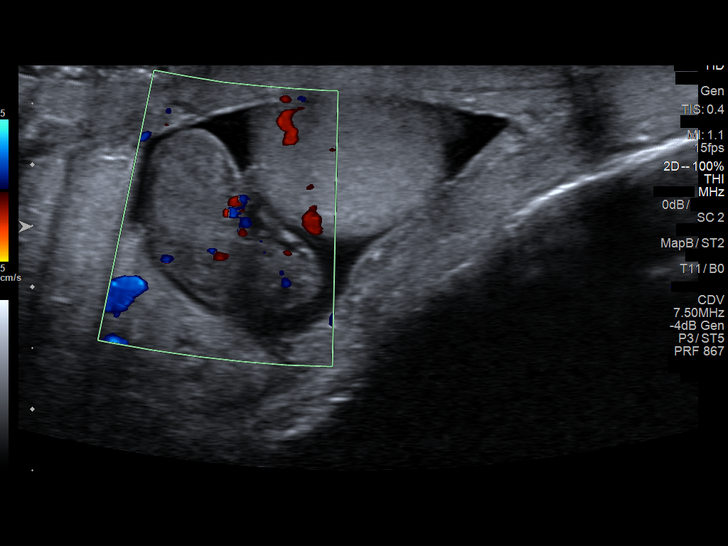
[im 66/66]
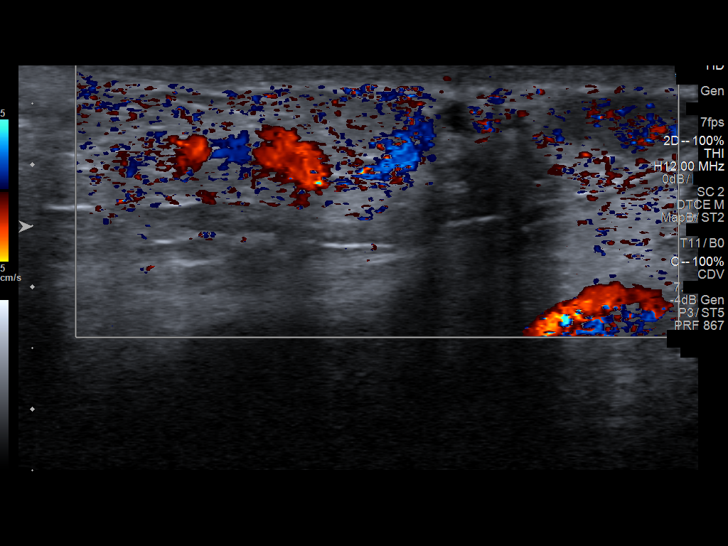

[14 of 25 positions shown; findings below may reference images not displayed]

FINDINGS: Right testicle

Measurements: 3.8 x 2.8 x 2.3 cm. No mass or microlithiasis
visualized.

Left testicle

Measurements: 4.1 x 2.9 x 2.1 cm. No mass or microlithiasis
visualized.

Right epididymis:  Normal in size and appearance.

Left epididymis:  Normal in size and appearance.

Hydrocele:  Small bilateral hydroceles are noted.

Varicocele: Mild bilateral varicoceles are noted, with right greater
than left.

Pulsed Doppler interrogation of both testes demonstrates normal low
resistance arterial and venous waveforms bilaterally.
IMPRESSION: No evidence of testicular mass or torsion. Small bilateral
hydroceles are noted. Mild bilateral varicoceles are noted.

## 2019-08-21 ENCOUNTER — Encounter: Payer: Self-pay | Admitting: Family Medicine

## 2019-08-21 ENCOUNTER — Ambulatory Visit (INDEPENDENT_AMBULATORY_CARE_PROVIDER_SITE_OTHER): Payer: Managed Care, Other (non HMO) | Admitting: Family Medicine

## 2019-08-21 ENCOUNTER — Other Ambulatory Visit: Payer: Self-pay

## 2019-08-21 VITALS — BP 131/84 | HR 63 | Ht 75.0 in | Wt 245.0 lb

## 2019-08-21 DIAGNOSIS — E119 Type 2 diabetes mellitus without complications: Secondary | ICD-10-CM | POA: Diagnosis not present

## 2019-08-21 DIAGNOSIS — E1165 Type 2 diabetes mellitus with hyperglycemia: Secondary | ICD-10-CM

## 2019-08-21 DIAGNOSIS — I10 Essential (primary) hypertension: Secondary | ICD-10-CM | POA: Diagnosis not present

## 2019-08-21 LAB — POCT GLYCOSYLATED HEMOGLOBIN (HGB A1C): Hemoglobin A1C: 5.4 % (ref 4.0–5.6)

## 2019-08-21 MED ORDER — METFORMIN HCL 500 MG PO TABS: 500.0000 mg | ORAL_TABLET | Freq: Two times a day (BID) | ORAL | 1 refills | Status: DC

## 2019-08-21 NOTE — Progress Notes (Signed)
Established Patient Office Visit  Subjective:  Patient ID: Gary Stevens, male    DOB: [birth year]  Age: 53 y.o. MRN: [id number]  CC:  Chief Complaint  Patient presents with  . Diabetes    HPI Gary Stevens presents for   Diabetes - no hypoglycemic events. No wounds or sores that are not healing well. No increased thirst or urination. Checking glucose at home. Taking medications as prescribed without any side effects.  Hypertension- Pt denies chest pain, SOB, dizziness, or heart palpitations.  Taking meds as directed w/o problems.  Denies medication side effects.   He just got married on Saturday and was quite excited about it.  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   Past Medical History:  Diagnosis Date  . ADHD 01/15/2009   Qualifier: Diagnosis of  By: Madilyn Fireman MD, Barnetta Chapel    . Anxiety state, unspecified 01/15/2009   Qualifier: Diagnosis of  By: Madilyn Fireman MD, Barnetta Chapel    . Essential hypertension, benign 06/06/2008   Qualifier: Diagnosis of  By: Madilyn Fireman MD, Barnetta Chapel    . HYPERTRIGLYCERIDEMIA 08/12/2009   Qualifier: Diagnosis of  By: Madilyn Fireman MD, Barnetta Chapel    . HYPERTROPHY PROSTATE W/UR OBST & OTH LUTS 01/15/2009   Qualifier: Diagnosis of  By: Madilyn Fireman MD, Barnetta Chapel    . INSOMNIA 06/06/2008   Qualifier: Diagnosis of  By: Madilyn Fireman MD, Barnetta Chapel    . KNEE PAIN 08/12/2009   Qualifier: Diagnosis of  By: Madilyn Fireman MD, Barnetta Chapel      No past surgical history on file.  Family History  Problem Relation Age of Onset  . Cirrhosis Father   . Diabetes Mellitus II Father   . Gallbladder disease Father   . Gallbladder disease Sister   . Gallbladder disease Son     Social History   Socioeconomic History  . Marital status: Single    Spouse name: Not on file  . Number of children: Not on file  . Years of education: Not on file  . Highest  education level: Not on file  Occupational History  . Not on file  Tobacco Use  . Smoking status: Current Every Day Smoker    Packs/day: 1.00    Years: 30.00    Pack years: 30.00    Types: Cigarettes  . Smokeless tobacco: Never Used  Substance and Sexual Activity  . Alcohol use: Not on file  . Drug use: Not on file  . Sexual activity: Not on file  Other Topics Concern  . Not on file  Social History Narrative  . Not on file   Social Determinants of Health   Financial Resource Strain:   . Difficulty of Paying Living Expenses: Not on file  Food Insecurity:   . Worried About Charity fundraiser in the Last Year: Not on file  . Ran Out of Food in the Last Year: Not on file  Transportation Needs:   . Lack of Transportation (Medical): Not on file  . Lack of Transportation (Non-Medical): Not on file  Physical Activity:   . Days of Exercise per Week: Not on file  . Minutes of Exercise per Session: Not on file  Stress:   . Feeling of Stress : Not on file  Social Connections:   . Frequency of Communication with Friends and Family: Not on file  . Frequency of  Social Gatherings with Friends and Family: Not on file  . Attends Religious Services: Not on file  . Active Member of Clubs or Organizations: Not on file  . Attends Archivist Meetings: Not on file  . Marital Status: Not on file  Intimate Partner Violence:   . Fear of Current or Ex-Partner: Not on file  . Emotionally Abused: Not on file  . Physically Abused: Not on file  . Sexually Abused: Not on file    Outpatient Medications Prior to Visit  Medication Sig Dispense Refill  . amphetamine-dextroamphetamine (ADDERALL) 20 MG tablet Take 20 mg by mouth 3 (three) times daily.  0  . [START ON 10/02/2019] amphetamine-dextroamphetamine (ADDERALL) 20 MG tablet Take one tablet three times daily. 90 tablet 0  . [START ON 09/04/2019] amphetamine-dextroamphetamine (ADDERALL) 20 MG tablet Take one tablet three times a day. 90  tablet 0  . amphetamine-dextroamphetamine (ADDERALL) 20 MG tablet Take one tablet three times daily. 90 tablet 0  . Blood Glucose Monitoring Suppl (ONE TOUCH ULTRA 2) w/Device KIT See admin instructions.  0  . lisinopril-hydrochlorothiazide (ZESTORETIC) 20-25 MG tablet Take 1 tablet by mouth daily. 90 tablet 1  . metoprolol succinate (TOPROL-XL) 100 MG 24 hr tablet Take 1 tablet (100 mg total) by mouth daily. Take with or immediately following a meal. 90 tablet 1  . ONE TOUCH ULTRA TEST test strip Check blood sugar 4 times daily 300 each prn  . pantoprazole (PROTONIX) 20 MG tablet Take 1 tablet (20 mg total) by mouth daily. 90 tablet 3  . amoxicillin-clavulanate (AUGMENTIN) 875-125 MG tablet Take 1 tablet by mouth 2 (two) times daily. 28 tablet 0  . metFORMIN (GLUCOPHAGE) 500 MG tablet Take 1 tablet (500 mg total) by mouth 2 (two) times daily with a meal. 180 tablet 1  . QUEtiapine (SEROQUEL) 100 MG tablet Take 1 tablet (100 mg total) by mouth 2 (two) times daily. Cancel previous prescription for the 39m tablets. 180 tablet 1   No facility-administered medications prior to visit.    Allergies  Allergen Reactions  . Mirtazapine     Other reaction(s): Unknown    ROS Review of Systems    Objective:    Physical Exam  Constitutional: He is oriented to person, place, and time. He appears well-developed and well-nourished.  HENT:  Head: Normocephalic and atraumatic.  Cardiovascular: Normal rate, regular rhythm and normal heart sounds.  Pulmonary/Chest: Effort normal and breath sounds normal.  Neurological: He is alert and oriented to person, place, and time.  Skin: Skin is warm and dry.  Psychiatric: He has a normal mood and affect. His behavior is normal.    BP 131/84   Pulse 63   Ht '6\' 3"'  (1.905 m)   Wt 245 lb (111.1 kg)   SpO2 98%   BMI 30.62 kg/m  Wt Readings from Last 3 Encounters:  08/21/19 245 lb (111.1 kg)  03/07/19 231 lb (104.8 kg)  11/11/18 229 lb (103.9 kg)      There are no preventive care reminders to display for this patient.  There are no preventive care reminders to display for this patient.  Lab Results  Component Value Date   TSH 3.440 01/15/2009   Lab Results  Component Value Date   WBC 7.7 04/22/2018   HGB 14.6 04/22/2018   HCT 41 04/22/2018   PLT 155 04/22/2018   Lab Results  Component Value Date   NA 139 04/29/2018   K 4.5 04/29/2018   CO2 27 04/29/2018  GLUCOSE 116 (H) 04/29/2018   BUN 15 04/29/2018   CREATININE 1.21 04/29/2018   BILITOT 0.7 11/19/2017   ALKPHOS 224 (A) 04/22/2018   AST 31 04/22/2018   ALT 52 (A) 04/22/2018   PROT 6.6 11/19/2017   ALBUMIN 4.8 01/11/2017   CALCIUM 9.0 04/29/2018   Lab Results  Component Value Date   CHOL 168 04/29/2018   Lab Results  Component Value Date   HDL 40 (L) 04/29/2018   Lab Results  Component Value Date   LDLCALC 94 04/29/2018   Lab Results  Component Value Date   TRIG 246 (H) 04/29/2018   Lab Results  Component Value Date   CHOLHDL 4.2 04/29/2018   Lab Results  Component Value Date   HGBA1C 5.4 08/21/2019      Assessment & Plan:   Problem List Items Addressed This Visit      Cardiovascular and Mediastinum   Essential hypertension    Blood pressure looks great today.  Plan to follow-up in 4 months.        Endocrine   Controlled type 2 diabetes mellitus without complication, without long-term current use of insulin ([place]) - Primary    Well controlled. Continue current regimen. Follow up in  4 mo      Relevant Medications   metFORMIN (GLUCOPHAGE) 500 MG tablet      Clines flu shot and pneumonia vaccines today.  Meds ordered this encounter  Medications  . metFORMIN (GLUCOPHAGE) 500 MG tablet    Sig: Take 1 tablet (500 mg total) by mouth 2 (two) times daily with a meal.    Dispense:  180 tablet    Refill:  1    Cancel rx for 1057m,OK TO FILL ON  09/06/2019    Follow-up: Return in about 6 months (around 02/19/2020).     CBeatrice Lecher MD

## 2019-08-21 NOTE — Assessment & Plan Note (Signed)
Blood pressure looks great today.  Plan to follow-up in 4 months.

## 2019-08-21 NOTE — Assessment & Plan Note (Signed)
Well controlled. Continue current regimen. Follow up in  4 mo 

## 2019-10-25 ENCOUNTER — Encounter: Payer: Self-pay | Admitting: Adult Health

## 2019-10-25 ENCOUNTER — Ambulatory Visit (INDEPENDENT_AMBULATORY_CARE_PROVIDER_SITE_OTHER): Payer: 59 | Admitting: Adult Health

## 2019-10-25 DIAGNOSIS — G4700 Insomnia, unspecified: Secondary | ICD-10-CM

## 2019-10-25 DIAGNOSIS — F909 Attention-deficit hyperactivity disorder, unspecified type: Secondary | ICD-10-CM

## 2019-10-25 DIAGNOSIS — F411 Generalized anxiety disorder: Secondary | ICD-10-CM

## 2019-10-25 DIAGNOSIS — F331 Major depressive disorder, recurrent, moderate: Secondary | ICD-10-CM | POA: Diagnosis not present

## 2019-10-25 MED ORDER — AMPHETAMINE-DEXTROAMPHETAMINE 20 MG PO TABS: ORAL_TABLET | ORAL | 0 refills | Status: DC

## 2019-10-25 MED ORDER — QUETIAPINE FUMARATE 100 MG PO TABS: 100.0000 mg | ORAL_TABLET | Freq: Every day | ORAL | 3 refills | Status: DC

## 2019-10-25 NOTE — Progress Notes (Signed)
Gary Stevens [id number] [birth year] 54 y.o.  Virtual Visit via Telephone Note  I connected with pt on 10/25/19 at  2:40 PM EST by telephone and verified that I am speaking with the correct person using two identifiers.   I discussed the limitations, risks, security and privacy concerns of performing an evaluation and management service by telephone and the availability of in person appointments. I also discussed with the patient that there may be a patient responsible charge related to this service. The patient expressed understanding and agreed to proceed.   I discussed the assessment and treatment plan with the patient. The patient was provided an opportunity to ask questions and all were answered. The patient agreed with the plan and demonstrated an understanding of the instructions.   The patient was advised to call back or seek an in-person evaluation if the symptoms worsen or if the condition fails to improve as anticipated.  I provided 30 minutes of non-face-to-face time during this encounter.  The patient was located at home.  The provider was located at [facility].   Aloha Gell, NP   Subjective:   Patient ID:  Gary Stevens is a 54 y.o. (DOB [birth year]) male.  Chief Complaint: No chief complaint on file.   HPI   [name] presents for follow-up of anxiety, depression, insomnia, ADHD.  Describes mood today as "ok". Pleasant. Mood symptoms - denies depression, anxiety, and irritability. Stating "I'm doing pretty good". Married in December. Wife helping out with his mother. 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 Stating "things are working out well for me". Stable interest and motivation. Taking medications as prescribed.  Energy levels stable. Active, does not have a regular exercise routine. Works full-time - Administrator.  Enjoys some usual interests and activities. Married. Lives with wife. Mother  and 3 sisters doing well. Spending time with family. Appetite adequate. Weight stable. Sleeps better some nights than others. Averages 6 to 8 hours. Sleeping in the truck.  Focus and concentration stable with Adderall. Completing tasks. Managing aspects of household. Work going well.  Denies SI or HI. Denies AH or VH.   Review of Systems:  Review of Systems  Musculoskeletal: Negative for gait problem.  Neurological: Negative for tremors.  Psychiatric/Behavioral:       Please refer to HPI    Medications: I have reviewed the patient's current medications.  Current Outpatient Medications  Medication Sig Dispense Refill  . amphetamine-dextroamphetamine (ADDERALL) 20 MG tablet Take 20 mg by mouth 3 (three) times daily.  0  . amphetamine-dextroamphetamine (ADDERALL) 20 MG tablet Take one tablet three times daily. 90 tablet 0  . amphetamine-dextroamphetamine (ADDERALL) 20 MG tablet Take one tablet three times daily. 90 tablet 0  . [START ON 12/20/2019] amphetamine-dextroamphetamine (ADDERALL) 20 MG tablet Take one tablet three times a day. 90 tablet 0  . Blood Glucose Monitoring Suppl (ONE TOUCH ULTRA 2) w/Device KIT See admin instructions.  0  . lisinopril-hydrochlorothiazide (ZESTORETIC) 20-25 MG tablet Take 1 tablet by mouth daily. 90 tablet 1  . metFORMIN (GLUCOPHAGE) 500 MG tablet Take 1 tablet (500 mg total) by mouth 2 (two) times daily with a meal. 180 tablet 1  . metoprolol succinate (TOPROL-XL) 100 MG 24 hr tablet Take 1 tablet (100 mg total) by mouth daily. Take with or immediately following a meal. 90 tablet 1  . ONE TOUCH ULTRA TEST test strip Check blood sugar  4 times daily 300 each prn  . pantoprazole (PROTONIX) 20 MG tablet Take 1 tablet (20 mg total) by mouth daily. 90 tablet 3  . QUEtiapine (SEROQUEL) 100 MG tablet Take 1 tablet (100 mg total) by mouth at bedtime. 180 tablet 3   No current facility-administered medications for this visit.    Medication Side Effects:  None  Allergies:  Allergies  Allergen Reactions  . Mirtazapine     Other reaction(s): Unknown    Past Medical History:  Diagnosis Date  . ADHD 01/15/2009   Qualifier: Diagnosis of  By: Madilyn Fireman MD, Barnetta Chapel    . Anxiety state, unspecified 01/15/2009   Qualifier: Diagnosis of  By: Madilyn Fireman MD, Barnetta Chapel    . Essential hypertension, benign 06/06/2008   Qualifier: Diagnosis of  By: Madilyn Fireman MD, Barnetta Chapel    . HYPERTRIGLYCERIDEMIA 08/12/2009   Qualifier: Diagnosis of  By: Madilyn Fireman MD, Barnetta Chapel    . HYPERTROPHY PROSTATE W/UR OBST & OTH LUTS 01/15/2009   Qualifier: Diagnosis of  By: Madilyn Fireman MD, Barnetta Chapel    . INSOMNIA 06/06/2008   Qualifier: Diagnosis of  By: Madilyn Fireman MD, Barnetta Chapel    . KNEE PAIN 08/12/2009   Qualifier: Diagnosis of  By: Madilyn Fireman MD, Idelle Crouch History  Problem Relation Age of Onset  . Cirrhosis Father   . Diabetes Mellitus II Father   . Gallbladder disease Father   . Gallbladder disease Sister   . Gallbladder disease Son     Social History   Socioeconomic History  . Marital status: Single    Spouse name: Not on file  . Number of children: Not on file  . Years of education: Not on file  . Highest education level: Not on file  Occupational History  . Not on file  Tobacco Use  . Smoking status: Current Every Day Smoker    Packs/day: 1.00    Years: 30.00    Pack years: 30.00    Types: Cigarettes  . Smokeless tobacco: Never Used  Substance and Sexual Activity  . Alcohol use: Not on file  . Drug use: Not on file  . Sexual activity: Not on file  Other Topics Concern  . Not on file  Social History Narrative  . Not on file   Social Determinants of Health   Financial Resource Strain:   . Difficulty of Paying Living Expenses: Not on file  Food Insecurity:   . Worried About Charity fundraiser in the Last Year: Not on file  . Ran Out of Food in the Last Year: Not on file  Transportation Needs:   . Lack of Transportation (Medical): Not on  file  . Lack of Transportation (Non-Medical): Not on file  Physical Activity:   . Days of Exercise per Week: Not on file  . Minutes of Exercise per Session: Not on file  Stress:   . Feeling of Stress : Not on file  Social Connections:   . Frequency of Communication with Friends and Family: Not on file  . Frequency of Social Gatherings with Friends and Family: Not on file  . Attends Religious Services: Not on file  . Active Member of Clubs or Organizations: Not on file  . Attends Archivist Meetings: Not on file  . Marital Status: Not on file  Intimate Partner Violence:   . Fear of Current or Ex-Partner: Not on file  . Emotionally Abused: Not on file  . Physically Abused: Not on file  . Sexually Abused: Not  on file    Past Medical History, Surgical history, Social history, and Family history were reviewed and updated as appropriate.   Please see review of systems for further details on the patient's review from today.   Objective:   Physical Exam:  There were no vitals taken for this visit.  Physical Exam Neurological:     Mental Status: He is alert and oriented to person, place, and time.     Cranial Nerves: No dysarthria.  Psychiatric:        Attention and Perception: Attention and perception normal.        Mood and Affect: Mood normal.        Speech: Speech normal.        Behavior: Behavior is cooperative.        Thought Content: Thought content normal. Thought content is not paranoid or delusional. Thought content does not include homicidal or suicidal ideation. Thought content does not include homicidal or suicidal plan.        Cognition and Memory: Cognition and memory normal.        Judgment: Judgment normal.     Comments: Insight intact     Lab Review:     Component Value Date/Time   NA 139 04/29/2018 0921   NA 137 04/22/2018 0000   K 4.5 04/29/2018 0921   CL 105 04/29/2018 0921   CO2 27 04/29/2018 0921   GLUCOSE 116 (H) 04/29/2018 0921   BUN 15  04/29/2018 0921   BUN 12 04/22/2018 0000   CREATININE 1.21 04/29/2018 0921   CALCIUM 9.0 04/29/2018 0921   PROT 6.6 11/19/2017 0923   ALBUMIN 4.8 01/11/2017 1129   AST 31 04/22/2018 0000   ALT 52 (A) 04/22/2018 0000   ALKPHOS 224 (A) 04/22/2018 0000   BILITOT 0.7 11/19/2017 0923   GFRNONAA 68 04/29/2018 0921   GFRAA 79 04/29/2018 0921       Component Value Date/Time   WBC 7.7 04/22/2018 0000   HGB 14.6 04/22/2018 0000   HCT 41 04/22/2018 0000   PLT 155 04/22/2018 0000    No results found for: POCLITH, LITHIUM   No results found for: PHENYTOIN, PHENOBARB, VALPROATE, CBMZ   .res Assessment: Plan:    1. Adderall 34m TID 2. Seroquel 107m- 1 to 2 at hs  RTC 3 months  Discussed potential metabolic side effects associated with atypical antipsychotics, as well as potential risk for movement side effects. Advised pt to contact office if movement side effects occur.   Discussed potential benefits, risks, and side effects of stimulants with patient to include increased heart rate, palpitations, insomnia, increased anxiety, increased irritability, or decreased appetite.  Instructed patient to contact office if experiencing any significant tolerability issues.  Patient advised to contact office with any questions, adverse effects, or acute worsening in signs and symptoms.  Diagnoses and all orders for this visit:  Insomnia, unspecified type -     QUEtiapine (SEROQUEL) 100 MG tablet; Take 1 tablet (100 mg total) by mouth at bedtime.  Major depressive disorder, recurrent episode, moderate (HCC) -     QUEtiapine (SEROQUEL) 100 MG tablet; Take 1 tablet (100 mg total) by mouth at bedtime.  Attention deficit hyperactivity disorder (ADHD), unspecified ADHD type -     amphetamine-dextroamphetamine (ADDERALL) 20 MG tablet; Take one tablet three times daily. -     amphetamine-dextroamphetamine (ADDERALL) 20 MG tablet; Take one tablet three times a day.  Generalized anxiety  disorder    Please see After Visit  Summary for patient specific instructions.  Future Appointments  Date Time Provider [facility]  12/20/2019  8:10 AM Hali Marry, MD [facility] None    No orders of the defined types were placed in this encounter.     -------------------------------

## 2019-10-30 ENCOUNTER — Encounter: Payer: Self-pay | Admitting: Family Medicine

## 2019-10-30 ENCOUNTER — Other Ambulatory Visit: Payer: Self-pay

## 2019-10-30 ENCOUNTER — Ambulatory Visit (INDEPENDENT_AMBULATORY_CARE_PROVIDER_SITE_OTHER): Payer: Worker's Compensation | Admitting: Family Medicine

## 2019-10-30 DIAGNOSIS — S46912A Strain of unspecified muscle, fascia and tendon at shoulder and upper arm level, left arm, initial encounter: Secondary | ICD-10-CM | POA: Insufficient documentation

## 2019-10-30 DIAGNOSIS — S161XXA Strain of muscle, fascia and tendon at neck level, initial encounter: Secondary | ICD-10-CM | POA: Diagnosis not present

## 2019-10-30 MED ORDER — CYCLOBENZAPRINE HCL 10 MG PO TABS: 10.0000 mg | ORAL_TABLET | Freq: Three times a day (TID) | ORAL | 0 refills | Status: DC | PRN

## 2019-10-30 MED ORDER — MELOXICAM 15 MG PO TABS: 15.0000 mg | ORAL_TABLET | Freq: Every day | ORAL | 0 refills | Status: DC
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
## 2019-10-30 NOTE — Assessment & Plan Note (Signed)
Continue icing/heat at home as needed for comfort.  Add on meloxicam and flexeril.  Given handout for HEP. If not improving with this will have him see PT and/or referral to sports medicine.

## 2019-10-30 NOTE — Assessment & Plan Note (Signed)
Does not appear to have rotator cuff tear, at least not full thickness as he has fairly good strength.   Some contusion and spasm of surrounding area likely contributing to pain.  If not improving he will let me know.

## 2019-10-30 NOTE — Patient Instructions (Signed)
Try flexeril and meloxicam.  Take meloxicam with food.  Flexeril may make you drowsy, use caution if driving.  Let us know if not improving over the next week.   Shoulder Pain Many things can cause shoulder pain, including:  An injury to the shoulder.  Overuse of the shoulder.  Arthritis. The source of the pain can be:  Inflammation.  An injury to the shoulder joint.  An injury to a tendon, ligament, or bone. Follow these instructions at home: Pay attention to changes in your symptoms. Let your health care provider know about them. Follow these instructions to relieve your pain. If you have a sling:  Wear the sling as told by your health care provider. Remove it only as told by your health care provider.  Loosen the sling if your fingers tingle, become numb, or turn cold and blue.  Keep the sling clean.  If the sling is not waterproof: ? Do not let it get wet. Remove it to shower or bathe.  Move your arm as little as possible, but keep your hand moving to prevent swelling. Managing pain, stiffness, and swelling   If directed, put ice on the painful area: ? Put ice in a plastic bag. ? Place a towel between your skin and the bag. ? Leave the ice on for 20 minutes, 2-3 times per day. Stop applying ice if it does not help with the pain.  Squeeze a soft ball or a foam pad as much as possible. This helps to keep the shoulder from swelling. It also helps to strengthen the arm. General instructions  Take over-the-counter and prescription medicines only as told by your health care provider.  Keep all follow-up visits as told by your health care provider. This is important. Contact a health care provider if:  Your pain gets worse.  Your pain is not relieved with medicines.  New pain develops in your arm, hand, or fingers. Get help right away if:  Your arm, hand, or fingers: ? Tingle. ? Become numb. ? Become swollen. ? Become painful. ? Turn white or  blue. Summary  Shoulder pain can be caused by an injury, overuse, or arthritis.  Pay attention to changes in your symptoms. Let your health care provider know about them.  This condition may be treated with a sling, ice, and pain medicines.  Contact your health care provider if the pain gets worse or new pain develops. Get help right away if your arm, hand, or fingers tingle or become numb, swollen, or painful.  Keep all follow-up visits as told by your health care provider. This is important. This information is not intended to replace advice given to you by your health care provider. Make sure you discuss any questions you have with your health care provider. Document Revised: 03/01/2018 Document Reviewed: 03/01/2018 Elsevier Patient Education  2020 [employer].

## 2019-10-31 ENCOUNTER — Other Ambulatory Visit: Payer: Self-pay

## 2019-10-31 MED ORDER — METOPROLOL SUCCINATE ER 100 MG PO TB24: 100.0000 mg | ORAL_TABLET | Freq: Every day | ORAL | 1 refills | Status: DC

## 2019-10-31 MED ORDER — LISINOPRIL-HYDROCHLOROTHIAZIDE 20-25 MG PO TABS: 1.0000 | ORAL_TABLET | Freq: Every day | ORAL | 1 refills | Status: DC

## 2019-11-06 ENCOUNTER — Ambulatory Visit (INDEPENDENT_AMBULATORY_CARE_PROVIDER_SITE_OTHER): Payer: Worker's Compensation | Admitting: Family Medicine

## 2019-11-06 ENCOUNTER — Encounter: Payer: Self-pay | Admitting: Family Medicine

## 2019-11-06 VITALS — BP 124/80 | HR 66 | Temp 98.0°F | Ht 75.0 in | Wt 257.0 lb

## 2019-11-06 DIAGNOSIS — S161XXD Strain of muscle, fascia and tendon at neck level, subsequent encounter: Secondary | ICD-10-CM

## 2019-11-06 DIAGNOSIS — S46912D Strain of unspecified muscle, fascia and tendon at shoulder and upper arm level, left arm, subsequent encounter: Secondary | ICD-10-CM | POA: Diagnosis not present

## 2019-11-06 NOTE — Assessment & Plan Note (Signed)
Overall symptoms are improving.  Continue meloxicam and flexeril as needed.  Will add on formal PT.  If not resolving will refer to sports medicine.

## 2019-11-06 NOTE — Patient Instructions (Addendum)
You should continue to remain out of work until your next follow up with me in about 2 weeks I have entered a referral to physical therapy, you will be contacted for appt.  Continue current medications as needed.

## 2019-11-06 NOTE — Assessment & Plan Note (Signed)
Pain improved.  Continue meloxicam and flexeril prn.  Adding on PT.
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
## 2019-11-09 ENCOUNTER — Other Ambulatory Visit: Payer: Self-pay

## 2019-11-09 ENCOUNTER — Ambulatory Visit (INDEPENDENT_AMBULATORY_CARE_PROVIDER_SITE_OTHER): Payer: 59 | Admitting: Physical Therapy

## 2019-11-09 DIAGNOSIS — M542 Cervicalgia: Secondary | ICD-10-CM | POA: Diagnosis not present

## 2019-11-09 DIAGNOSIS — M25512 Pain in left shoulder: Secondary | ICD-10-CM

## 2019-11-09 NOTE — Patient Instructions (Signed)
Access Code: [email or web address] URL: [email or web address] Date: 11/09/2019 Prepared by: Narda Amber  Exercises Seated Assisted Cervical Rotation with Towel - 5 reps - 10 seconds hold - 3-4x daily - 7x weekly Seated Cervical Rotation AROM - 5 reps - 10 seconds hold - 3-4x daily - 7x weekly Supine Cervical Retraction with Towel - 10 reps - 5-10 seconds hold - 3-4x daily - 7x weekly Seated Cervical Retraction - 10 reps - 5-10 seconds hold - 3-4x daily - 7x weekly Supine Shoulder Flexion Extension AAROM with Dowel - 10 reps - 5 seconds hold - 3-4x daily - 7x weekly Supine Shoulder External Rotation in 45 Degrees Abduction AAROM with Dowel - 10 reps - 5 seconds hold - 3-4x daily - 7x weekly
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
## 2019-11-13 ENCOUNTER — Ambulatory Visit (INDEPENDENT_AMBULATORY_CARE_PROVIDER_SITE_OTHER): Payer: 59 | Admitting: Physical Therapy

## 2019-11-13 ENCOUNTER — Other Ambulatory Visit: Payer: Self-pay

## 2019-11-13 DIAGNOSIS — M542 Cervicalgia: Secondary | ICD-10-CM

## 2019-11-13 DIAGNOSIS — M25512 Pain in left shoulder: Secondary | ICD-10-CM

## 2019-11-13 NOTE — Patient Instructions (Signed)
Access Code: [email or web address]: [email or web address]: 03/15/2021Prepared by: [facility]Exercises  Seated Assisted Cervical Rotation with Towel - 3-4 x daily - 7 x weekly - 5 reps - 10 seconds hold  Seated Cervical Rotation AROM - 3-4 x daily - 7 x weekly - 5 reps - 10 seconds hold  Supine Shoulder Flexion Extension AAROM with Dowel - 3 x daily - 7 x weekly - 10 reps - 5 seconds hold  Seated Shoulder Rolls - 3 x daily - 7 x weekly - 5-10 reps - 1 sets  Corner Pec Major Stretch - 2 x daily - 7 x weekly - 3 reps - 1 sets  Sidelying Thoracic Rotation with Open Book - 1 x daily - 7 x weekly - 5 reps - 1 sets
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
## 2019-11-15 ENCOUNTER — Ambulatory Visit (INDEPENDENT_AMBULATORY_CARE_PROVIDER_SITE_OTHER): Payer: 59 | Admitting: Physical Therapy

## 2019-11-15 ENCOUNTER — Other Ambulatory Visit: Payer: Self-pay

## 2019-11-15 DIAGNOSIS — M25512 Pain in left shoulder: Secondary | ICD-10-CM | POA: Diagnosis not present

## 2019-11-15 DIAGNOSIS — M542 Cervicalgia: Secondary | ICD-10-CM | POA: Diagnosis not present
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
## 2019-11-20 ENCOUNTER — Ambulatory Visit (INDEPENDENT_AMBULATORY_CARE_PROVIDER_SITE_OTHER): Payer: Worker's Compensation | Admitting: Family Medicine

## 2019-11-20 ENCOUNTER — Other Ambulatory Visit: Payer: Self-pay

## 2019-11-20 ENCOUNTER — Encounter: Payer: Self-pay | Admitting: Family Medicine

## 2019-11-20 ENCOUNTER — Ambulatory Visit (INDEPENDENT_AMBULATORY_CARE_PROVIDER_SITE_OTHER): Payer: 59 | Admitting: Physical Therapy

## 2019-11-20 DIAGNOSIS — M542 Cervicalgia: Secondary | ICD-10-CM | POA: Diagnosis not present

## 2019-11-20 DIAGNOSIS — Z0289 Encounter for other administrative examinations: Secondary | ICD-10-CM

## 2019-11-20 DIAGNOSIS — M25512 Pain in left shoulder: Secondary | ICD-10-CM

## 2019-11-20 DIAGNOSIS — S46912D Strain of unspecified muscle, fascia and tendon at shoulder and upper arm level, left arm, subsequent encounter: Secondary | ICD-10-CM | POA: Diagnosis not present

## 2019-11-20 MED ORDER — MELOXICAM 15 MG PO TABS: 15.0000 mg | ORAL_TABLET | Freq: Every day | ORAL | 0 refills | Status: DC

## 2019-11-20 MED ORDER — CYCLOBENZAPRINE HCL 10 MG PO TABS: 10.0000 mg | ORAL_TABLET | Freq: Three times a day (TID) | ORAL | 0 refills | Status: DC | PRN

## 2019-11-20 NOTE — Assessment & Plan Note (Addendum)
Slowly improving.  He will continue PT for now.  Meloxicam and flexeril renewed.  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  He will continue to remain out of work until 12/04/19 and follow up at that time.  If not making significant improvement at this point we can consider referral to sports medicine for evaluation and additional recommendations.

## 2019-11-20 NOTE — Patient Instructions (Signed)
Continue with PT.  Ice after PT sessions.  See me again in about 2 weeks.

## 2019-11-20 NOTE — Progress Notes (Signed)
Gary Stevens - 54 y.o. male MRN [id number]  Date of birth: 03-06-1966  Subjective Chief Complaint  Patient presents with  . Follow-up    HPI Gary Stevens is 54 y.o. male here today for follow up of neck pain.  This is a workers comp follow up  from injury that occurred on 10/16/19.  Since his last visit with me he has started PT and reports that he has had some improvement. 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  He denies any worsening symptoms, numbness or tingling into arms. He needs refills of flexeril and meloxicam.   ROS:  A comprehensive ROS was completed and negative except as noted per HPI  Allergies  Allergen Reactions  . Mirtazapine     Other reaction(s): Unknown    Past Medical History:  Diagnosis Date  . ADHD 01/15/2009   Qualifier: Diagnosis of  By: Linford Arnold MD, Santina Evans    . Anxiety state, unspecified 01/15/2009   Qualifier: Diagnosis of  By: Linford Arnold MD, Santina Evans    . Essential hypertension, benign 06/06/2008   Qualifier: Diagnosis of  By: Linford Arnold MD, Santina Evans    . HYPERTRIGLYCERIDEMIA 08/12/2009   Qualifier: Diagnosis of  By: Linford Arnold MD, Santina Evans    . HYPERTROPHY PROSTATE W/UR OBST & OTH LUTS 01/15/2009   Qualifier: Diagnosis of  By: Linford Arnold MD, Santina Evans    . INSOMNIA 06/06/2008   Qualifier: Diagnosis of  By: Linford Arnold MD, Santina Evans    . KNEE PAIN 08/12/2009   Qualifier: Diagnosis of  By: Linford Arnold MD, Santina Evans      No past surgical history on file.  Social History   Socioeconomic History  . Marital status: Single    Spouse name: Not on file  . Number of children: Not on file  . Years of education: Not on file  . Highest education level: Not on file  Occupational History  . Not on file  Tobacco Use  . Smoking status: Current Every Day Smoker    Packs/day: 1.00    Years: 30.00    Pack years: 30.00    Types: Cigarettes  .  Smokeless tobacco: Never Used  Substance and Sexual Activity  . Alcohol use: Not on file  . Drug use: Not on file  . Sexual activity: Not on file  Other Topics Concern  . Not on file  Social History Narrative  . Not on file   Social Determinants of Health   Financial Resource Strain:   . Difficulty of Paying Living Expenses:   Food Insecurity:   . Worried About Programme researcher, broadcasting/film/video in the Last Year:   . Barista in the Last Year:   Transportation Needs:   . Freight forwarder (Medical):   Marland Kitchen Lack of Transportation (Non-Medical):   Physical Activity:   . Days of Exercise per Week:   . Minutes of Exercise per Session:   Stress:   . Feeling of Stress :   Social Connections:   . Frequency of Communication with Friends and Family:   . Frequency of Social Gatherings with Friends and Family:   . Attends Religious Services:   . Active Member of Clubs or Organizations:   . Attends Banker Meetings:   Marland Kitchen Marital Status:     Family History  Problem Relation Age of Onset  . Cirrhosis Father   .  Diabetes Mellitus II Father   . Gallbladder disease Father   . Gallbladder disease Sister   . Gallbladder disease Son     Health Maintenance  Topic Date Due  . PNEUMOCOCCAL POLYSACCHARIDE VACCINE AGE 45-64 HIGH RISK  Never done  . OPHTHALMOLOGY EXAM  Never done  . INFLUENZA VACCINE  11/29/2019 (Originally 04/01/2019)  . COLONOSCOPY  11/29/2019 (Originally 09/21/2015)  . HIV Screening  09/20/2028 (Originally 09/20/1980)  . HEMOGLOBIN A1C  02/19/2020  . FOOT EXAM  08/20/2020  . TETANUS/TDAP  11/19/2027     ----------------------------------------------------------------------------------------------------------------------------------------------------------------------------------------------------------------- Physical Exam BP 140/90   Pulse 76   Ht 6\' 3"  (1.905 m)   Wt 254 lb (115.2 kg)   BMI 31.75 kg/m   Physical Exam Constitutional:      Appearance:  Normal appearance.  HENT:     Head: Normocephalic and atraumatic.  Eyes:     General: No scleral icterus. Cardiovascular:     Rate and Rhythm: Normal rate and regular rhythm.  Pulmonary:     Effort: Pulmonary effort is normal.     Breath sounds: Normal breath sounds.  Musculoskeletal:     Cervical back: Normal range of motion.     Comments: ROM of L shoulder still limited in abduction and flexion, improved from prior exam.  Strength is normal.   Some reduced ROM with rotational movement of neck and ttp along cervical paraspinals.   Skin:    General: Skin is warm and dry.  Neurological:     Mental Status: He is alert.     ------------------------------------------------------------------------------------------------------------------------------------------------------------------------------------------------------------------- Assessment and Plan  Left shoulder strain Slowly improving.  He will continue PT for now.  Meloxicam and flexeril renewed.  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  He will continue to remain out of work until 12/04/19 and follow up at that time.  If not making significant improvement at this point we can consider referral to sports medicine for evaluation and additional recommendations.    Meds ordered this encounter  Medications  . cyclobenzaprine (FLEXERIL) 10 MG tablet    Sig: Take 1 tablet (10 mg total) by mouth 3 (three) times daily as needed for muscle spasms.    Dispense:  30 tablet    Refill:  0  . meloxicam (MOBIC) 15 MG tablet    Sig: Take 1 tablet (15 mg total) by mouth daily. Take with food    Dispense:  15 tablet    Refill:  0    Return in about 2 weeks (around 12/04/2019) for Shoulder pain.    This visit occurred during the SARS-CoV-2 public health emergency.  Safety protocols were in place, including screening  questions prior to the visit, additional usage of staff PPE, and extensive cleaning of exam room while observing appropriate contact time as indicated for disinfecting solutions.
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
## 2019-11-23 ENCOUNTER — Other Ambulatory Visit: Payer: Self-pay

## 2019-11-23 ENCOUNTER — Encounter: Payer: Self-pay | Admitting: Rehabilitative and Restorative Service Providers"

## 2019-11-23 ENCOUNTER — Ambulatory Visit (INDEPENDENT_AMBULATORY_CARE_PROVIDER_SITE_OTHER): Payer: 59 | Admitting: Rehabilitative and Restorative Service Providers"

## 2019-11-23 DIAGNOSIS — M25512 Pain in left shoulder: Secondary | ICD-10-CM

## 2019-11-23 DIAGNOSIS — M542 Cervicalgia: Secondary | ICD-10-CM

## 2019-11-23 NOTE — Patient Instructions (Addendum)
Trigger Point Dry Needling  . What is Trigger Point Dry Needling (DN)? o DN is a physical therapy technique used to treat muscle pain and dysfunction. Specifically, DN helps deactivate muscle trigger points (muscle knots).  o A thin filiform needle is used to penetrate the skin and stimulate the underlying trigger point. The goal is for a local twitch response (LTR) to occur and for the trigger point to relax. No medication of any kind is injected during the procedure.   . What Does Trigger Point Dry Needling Feel Like?  o The procedure feels different for each individual patient. Some patients report that they do not actually feel the needle enter the skin and overall the process is not painful. Very mild bleeding may occur. However, many patients feel a deep cramping in the muscle in which the needle was inserted. This is the local twitch response.   Marland Kitchen How Will I feel after the treatment? o Soreness is normal, and the onset of soreness may not occur for a few hours. Typically this soreness does not last longer than two days.  o Bruising is uncommon, however; ice can be used to decrease any possible bruising.  o In rare cases feeling tired or nauseous after the treatment is normal. In addition, your symptoms may get worse before they get better, this period will typically not last longer than 24 hours.   . What Can I do After My Treatment? o Increase your hydration by drinking more water for the next 24 hours. o You may place ice or heat on the areas treated that have become sore, however, do not use heat on inflamed or bruised areas. Heat often brings more relief post needling. o You can continue your regular activities, but vigorous activity is not recommended initially after the treatment for 24 hours. o DN is best combined with other physical therapy such as strengthening, stretching, and other therapies.   Access Code: [email or web address]: [email or web address]: 03/25/2021Prepared  by: Wayne Brunker HoltExercises  Seated Assisted Cervical Rotation with Towel - 3-4 x daily - 7 x weekly - 5 reps - 10 seconds hold  Seated Cervical Rotation AROM - 3-4 x daily - 7 x weekly - 5 reps - 10 seconds hold  Supine Shoulder Flexion Extension AAROM with Dowel - 3 x daily - 7 x weekly - 10 reps - 5 seconds hold  Seated Shoulder Rolls - 3 x daily - 7 x weekly - 5-10 reps - 1 sets  Corner Pec Major Stretch - 2 x daily - 7 x weekly - 3 reps - 1 sets  Sidelying Thoracic Rotation with Open Book - 1 x daily - 7 x weekly - 5 reps - 1 sets  First Rib Mobilization with Strap - 2 x daily - 7 x weekly - 1 sets - 2 reps - 15 hold  Doorway Pec Stretch at 60 Elevation - 2 x daily - 7 x weekly - 3 reps - 1 sets - 30 sec hold  Doorway Pec Stretch at 90 Degrees Abduction - 2 x daily - 7 x weekly - 3 reps - 1 sets - 30 sec hold  Doorway Pec Stretch at 120 Degrees Abduction - 2 x daily - 7 x weekly - 3 reps - 1 sets - 30 sec hold
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
## 2019-11-27 ENCOUNTER — Encounter: Payer: 59 | Admitting: Rehabilitative and Restorative Service Providers"

## 2019-11-30 ENCOUNTER — Encounter: Payer: 59 | Admitting: Rehabilitative and Restorative Service Providers"

## 2019-12-05 ENCOUNTER — Other Ambulatory Visit: Payer: Self-pay

## 2019-12-05 ENCOUNTER — Ambulatory Visit (INDEPENDENT_AMBULATORY_CARE_PROVIDER_SITE_OTHER): Payer: 59 | Admitting: Rehabilitative and Restorative Service Providers"

## 2019-12-05 ENCOUNTER — Encounter: Payer: Self-pay | Admitting: Rehabilitative and Restorative Service Providers"

## 2019-12-05 DIAGNOSIS — M542 Cervicalgia: Secondary | ICD-10-CM | POA: Diagnosis not present

## 2019-12-05 DIAGNOSIS — M25512 Pain in left shoulder: Secondary | ICD-10-CM
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
## 2019-12-05 NOTE — Patient Instructions (Signed)
Resisted External Rotation: in Neutral - Bilateral   PALMS UP Sit or stand, tubing in both hands, elbows at sides, bent to 90, forearms forward. Pinch shoulder blades together and rotate forearms out. Keep elbows at sides. Repeat __10__ times per set. Do _2-3___ sets per session. Do _2-3___ sessions per day.   Low Row: Standing   Face anchor, feet shoulder width apart. Palms up, pull arms back, squeezing shoulder blades together. Repeat 10__ times per set. Do 2-3__ sets per session. Do 2-3__ sessions per week. Anchor Height: Waist   Strengthening: Resisted Extension   Hold tubing in right hand, arm forward. Pull arm back, elbow straight. Repeat _10___ times per set. Do 2-3____ sets per session. Do 2-3____ sessions per day.   Place arms to side making a T  Drop right arm and turn body slightly to the right  Hold 30 sec 3 reps 2x/day

## 2019-12-11 ENCOUNTER — Encounter: Payer: Self-pay | Admitting: Family Medicine

## 2019-12-11 ENCOUNTER — Other Ambulatory Visit: Payer: Self-pay

## 2019-12-11 ENCOUNTER — Ambulatory Visit (INDEPENDENT_AMBULATORY_CARE_PROVIDER_SITE_OTHER): Payer: Worker's Compensation | Admitting: Family Medicine

## 2019-12-11 DIAGNOSIS — S46912D Strain of unspecified muscle, fascia and tendon at shoulder and upper arm level, left arm, subsequent encounter: Secondary | ICD-10-CM | POA: Diagnosis not present

## 2019-12-11 DIAGNOSIS — S161XXA Strain of muscle, fascia and tendon at neck level, initial encounter: Secondary | ICD-10-CM

## 2019-12-11 NOTE — Assessment & Plan Note (Signed)
Significant improvement with PT.  He will continue HEP.  Ready to return to work.

## 2019-12-11 NOTE — Patient Instructions (Signed)
Continue home exercises.  Let me know if symptoms return.  Letter provided to return back to work.
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
## 2019-12-11 NOTE — Assessment & Plan Note (Signed)
He has done well with PT and has reached maximum benefit with this.  He is ready and able to return to full time work.  Letter provided.

## 2019-12-20 ENCOUNTER — Ambulatory Visit: Payer: Managed Care, Other (non HMO) | Admitting: Family Medicine

## 2020-02-15 ENCOUNTER — Telehealth: Payer: Self-pay | Admitting: Family Medicine

## 2020-02-15 NOTE — Telephone Encounter (Signed)
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

## 2020-02-16 ENCOUNTER — Other Ambulatory Visit: Payer: Self-pay

## 2020-02-16 MED ORDER — AMOXICILLIN-POT CLAVULANATE 875-125 MG PO TABS: 1.0000 | ORAL_TABLET | Freq: Two times a day (BID) | ORAL | 0 refills | Status: DC

## 2020-02-16 NOTE — Telephone Encounter (Signed)
ABX sent to pharmacy  

## 2020-02-16 NOTE — Telephone Encounter (Signed)
Mr. Ida states the tooth is not broken. He did check.   His sx of infection are: pain and sour taste in the mouth.

## 2020-02-16 NOTE — Telephone Encounter (Signed)
Left message advising of new prescription.  

## 2020-02-16 NOTE — Telephone Encounter (Signed)
Please triage. It is infected? Or just broken?

## 2020-03-01 ENCOUNTER — Telehealth: Payer: Self-pay | Admitting: Family Medicine

## 2020-03-01 NOTE — Telephone Encounter (Signed)
Call pt: due for colon can screening. See what he would like ot do.

## 2020-03-05 ENCOUNTER — Telehealth: Payer: Self-pay

## 2020-03-05 MED ORDER — METFORMIN HCL 500 MG PO TABS: 500.0000 mg | ORAL_TABLET | Freq: Two times a day (BID) | ORAL | 0 refills | Status: DC

## 2020-03-05 NOTE — Telephone Encounter (Signed)
Patient requesting RF on Metformin. Past due for diabetes follow up.   30 day supply send, please call and get patient scheduled for an appt.  (also let him know he is due for colon cancer screening, I can place referral for this or he can discuss other options with Dr Linford Arnold at visit)

## 2020-03-06 NOTE — Telephone Encounter (Signed)
Patient will discuss at upcoming appointment

## 2020-03-06 NOTE — Telephone Encounter (Signed)
Patient will discuss the colon cancer screening at appointment with PCP. Did not want a referral at this time. Appointment had been made.

## 2020-03-12 ENCOUNTER — Encounter: Payer: Self-pay | Admitting: Adult Health

## 2020-03-12 ENCOUNTER — Telehealth: Payer: Self-pay | Admitting: Adult Health

## 2020-03-12 ENCOUNTER — Telehealth (INDEPENDENT_AMBULATORY_CARE_PROVIDER_SITE_OTHER): Payer: 59 | Admitting: Adult Health

## 2020-03-12 DIAGNOSIS — F411 Generalized anxiety disorder: Secondary | ICD-10-CM | POA: Diagnosis not present

## 2020-03-12 DIAGNOSIS — G4700 Insomnia, unspecified: Secondary | ICD-10-CM

## 2020-03-12 DIAGNOSIS — F909 Attention-deficit hyperactivity disorder, unspecified type: Secondary | ICD-10-CM | POA: Diagnosis not present

## 2020-03-12 DIAGNOSIS — F331 Major depressive disorder, recurrent, moderate: Secondary | ICD-10-CM | POA: Diagnosis not present

## 2020-03-12 MED ORDER — AMPHETAMINE-DEXTROAMPHETAMINE 20 MG PO TABS: ORAL_TABLET | ORAL | 0 refills | Status: DC

## 2020-03-12 MED ORDER — QUETIAPINE FUMARATE 50 MG PO TABS: 50.0000 mg | ORAL_TABLET | Freq: Every day | ORAL | 3 refills | Status: DC
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
## 2020-03-12 NOTE — Telephone Encounter (Signed)
Noted  

## 2020-03-12 NOTE — Telephone Encounter (Signed)
Written prescriptions for Adderall 20mg  tablet for July,August,September, 2021 were mailed to the pt 03/13/2020

## 2020-03-15 ENCOUNTER — Other Ambulatory Visit: Payer: Self-pay

## 2020-03-15 ENCOUNTER — Ambulatory Visit (INDEPENDENT_AMBULATORY_CARE_PROVIDER_SITE_OTHER): Payer: 59 | Admitting: Family Medicine

## 2020-03-15 ENCOUNTER — Encounter: Payer: Self-pay | Admitting: Family Medicine

## 2020-03-15 VITALS — BP 132/78 | HR 60 | Ht 75.0 in | Wt 255.0 lb

## 2020-03-15 DIAGNOSIS — I10 Essential (primary) hypertension: Secondary | ICD-10-CM | POA: Diagnosis not present

## 2020-03-15 DIAGNOSIS — Z125 Encounter for screening for malignant neoplasm of prostate: Secondary | ICD-10-CM | POA: Diagnosis not present

## 2020-03-15 DIAGNOSIS — E1165 Type 2 diabetes mellitus with hyperglycemia: Secondary | ICD-10-CM

## 2020-03-15 DIAGNOSIS — F430 Acute stress reaction: Secondary | ICD-10-CM

## 2020-03-15 DIAGNOSIS — N401 Enlarged prostate with lower urinary tract symptoms: Secondary | ICD-10-CM

## 2020-03-15 DIAGNOSIS — E119 Type 2 diabetes mellitus without complications: Secondary | ICD-10-CM | POA: Diagnosis not present

## 2020-03-15 LAB — POCT GLYCOSYLATED HEMOGLOBIN (HGB A1C): Hemoglobin A1C: 5.8 % — AB (ref 4.0–5.6)

## 2020-03-15 MED ORDER — METFORMIN HCL 500 MG PO TABS: 500.0000 mg | ORAL_TABLET | Freq: Two times a day (BID) | ORAL | 1 refills | Status: DC

## 2020-03-15 MED ORDER — METOPROLOL SUCCINATE ER 100 MG PO TB24: 100.0000 mg | ORAL_TABLET | Freq: Every day | ORAL | 1 refills | Status: DC

## 2020-03-15 MED ORDER — LISINOPRIL-HYDROCHLOROTHIAZIDE 20-25 MG PO TABS: 1.0000 | ORAL_TABLET | Freq: Every day | ORAL | 1 refills | Status: DC

## 2020-03-15 NOTE — Assessment & Plan Note (Addendum)
Well controlled.  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  He tries to avoid eating fast food.  Continue current regimen. Follow up in  4 months. Due for refills

## 2020-03-15 NOTE — Assessment & Plan Note (Signed)
Due to recheck PSA.  °
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
## 2020-03-15 NOTE — Assessment & Plan Note (Signed)
Well controlled. Continue current regimen. Follow up in  4 months.   

## 2020-03-18 ENCOUNTER — Telehealth: Payer: Self-pay | Admitting: Adult Health

## 2020-03-18 ENCOUNTER — Other Ambulatory Visit: Payer: Self-pay

## 2020-03-18 DIAGNOSIS — G4700 Insomnia, unspecified: Secondary | ICD-10-CM

## 2020-03-18 DIAGNOSIS — F331 Major depressive disorder, recurrent, moderate: Secondary | ICD-10-CM

## 2020-03-18 LAB — CBC
HCT: 42 % (ref 38.5–50.0)
Hemoglobin: 14.4 g/dL (ref 13.2–17.1)
MCH: 31.2 pg (ref 27.0–33.0)
MCHC: 34.3 g/dL (ref 32.0–36.0)
MCV: 91.1 fL (ref 80.0–100.0)
MPV: 11.4 fL (ref 7.5–12.5)
Platelets: 195 10*3/uL (ref 140–400)
RBC: 4.61 10*6/uL (ref 4.20–5.80)
RDW: 13.1 % (ref 11.0–15.0)
WBC: 7.2 10*3/uL (ref 3.8–10.8)

## 2020-03-18 LAB — COMPLETE METABOLIC PANEL WITH GFR
AG Ratio: 2 (calc) (ref 1.0–2.5)
ALT: 20 U/L (ref 9–46)
AST: 23 U/L (ref 10–35)
Albumin: 4.2 g/dL (ref 3.6–5.1)
Alkaline phosphatase (APISO): 100 U/L (ref 35–144)
BUN: 18 mg/dL (ref 7–25)
CO2: 29 mmol/L (ref 20–32)
Calcium: 9.2 mg/dL (ref 8.6–10.3)
Chloride: 107 mmol/L (ref 98–110)
Creat: 1.04 mg/dL (ref 0.70–1.33)
GFR, Est African American: 94 mL/min/{1.73_m2} (ref >=60)
GFR, Est Non African American: 81 mL/min/{1.73_m2} (ref >=60)
Globulin: 2.1 g/dL (calc) (ref 1.9–3.7)
Glucose, Bld: 128 mg/dL (ref 65–139)
Potassium: 3.4 mmol/L — ABNORMAL LOW (ref 3.5–5.3)
Sodium: 143 mmol/L (ref 135–146)
Total Bilirubin: 0.5 mg/dL (ref 0.2–1.2)
Total Protein: 6.3 g/dL (ref 6.1–8.1)

## 2020-03-18 LAB — LIPID PANEL
Cholesterol: 166 mg/dL (ref <200)
HDL: 43 mg/dL (ref >=40)
LDL Cholesterol (Calc): 91 mg/dL (calc)
Non-HDL Cholesterol (Calc): 123 mg/dL (calc) (ref <130)
Total CHOL/HDL Ratio: 3.9 (calc) (ref <5.0)
Triglycerides: 234 mg/dL — ABNORMAL HIGH (ref <150)

## 2020-03-18 LAB — PSA: PSA: 0.4 ng/mL (ref <=4.0)

## 2020-03-18 MED ORDER — QUETIAPINE FUMARATE 100 MG PO TABS: ORAL_TABLET | ORAL | 3 refills | Status: DC

## 2020-03-18 NOTE — Telephone Encounter (Signed)
Pt lm stating the directions on his Seroquel Rx are not written correctly. Please advise.

## 2020-03-18 NOTE — Telephone Encounter (Signed)
Pharmacy will not fill Seroquel the way Rx is worded. Please call to verify Gary Stevens location. Pt # [phone]

## 2020-03-18 NOTE — Telephone Encounter (Signed)
Pharmacy just needed clarification on his current dose. They will discontinue the previous dose of 50 mg seroquel and fill for 100 mg.

## 2020-03-18 NOTE — Telephone Encounter (Signed)
Script sent  

## 2020-04-04 IMAGING — US US ABDOMEN COMPLETE
1 series · 14 of 25 positions shown · non-contrast
Comparison: None.

CLINICAL DATA: 52-year-old male with a history of right upper
quadrant pain

EXAM:
ABDOMEN ULTRASOUND COMPLETE

[Series 1: us abdomen complete · 0.17mm/px · 14 of 75 slices shown]
[im 1/75]
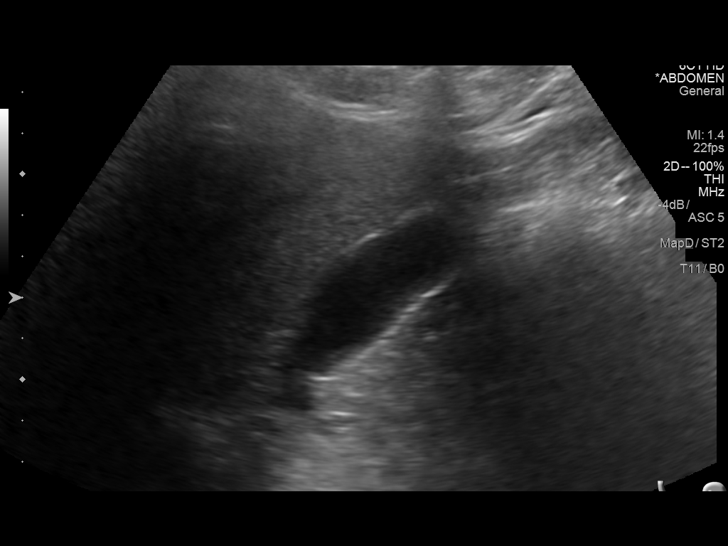
[im 7/75]
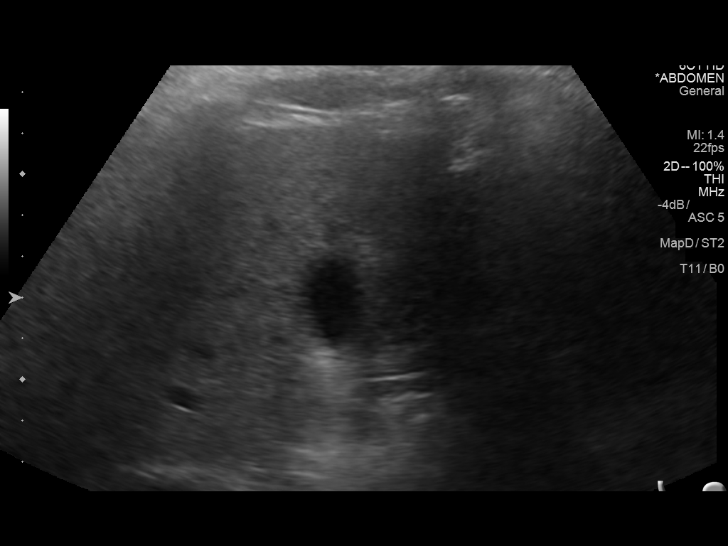
[im 13/75]
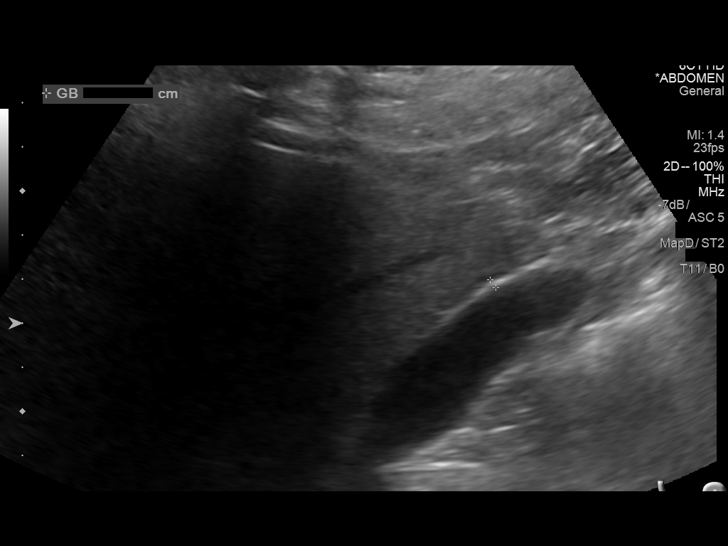
[im 19/75]
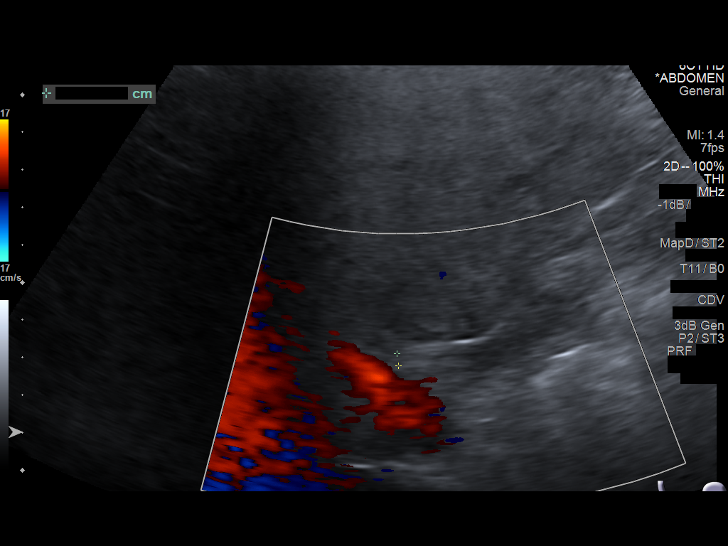
[im 25/75]
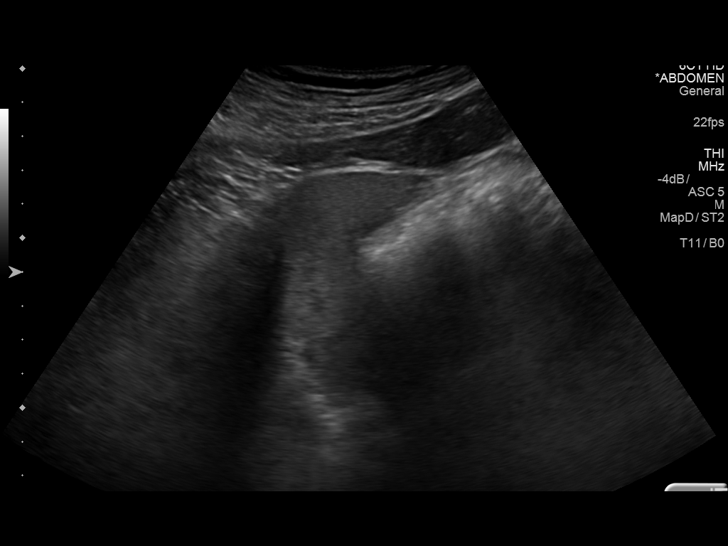
[im 28/75]
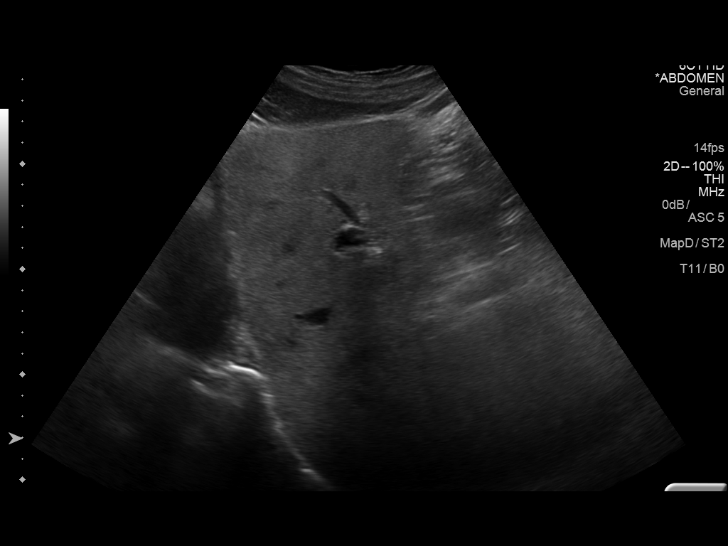
[im 34/75]
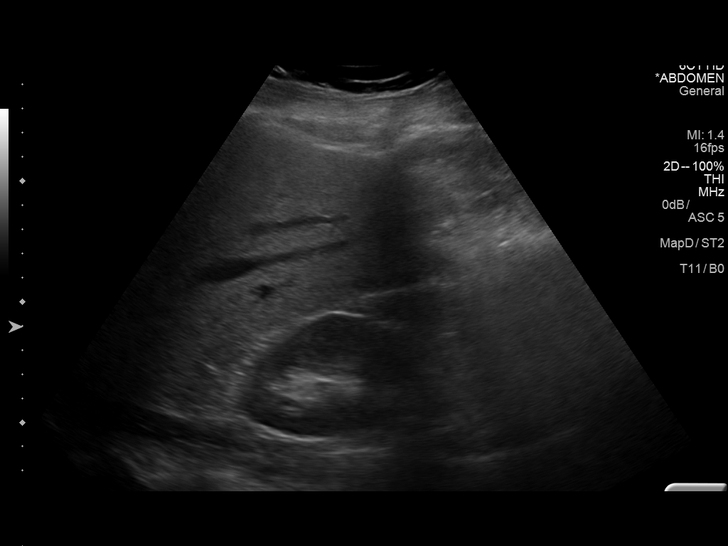
[im 41/75]
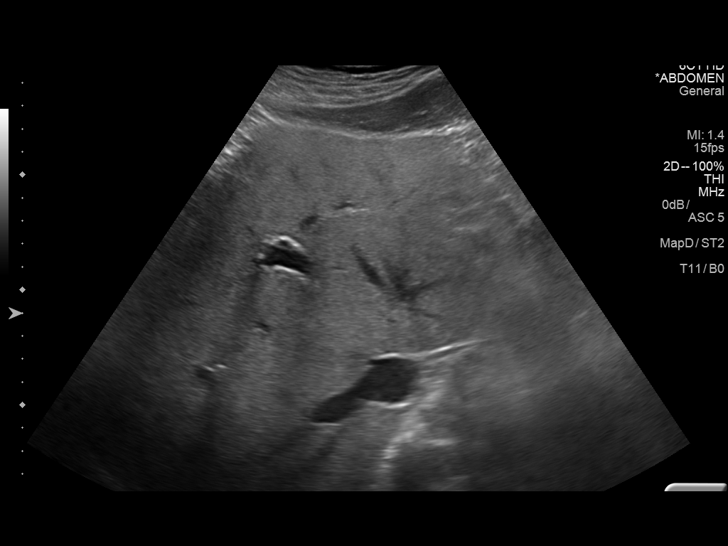
[im 47/75]
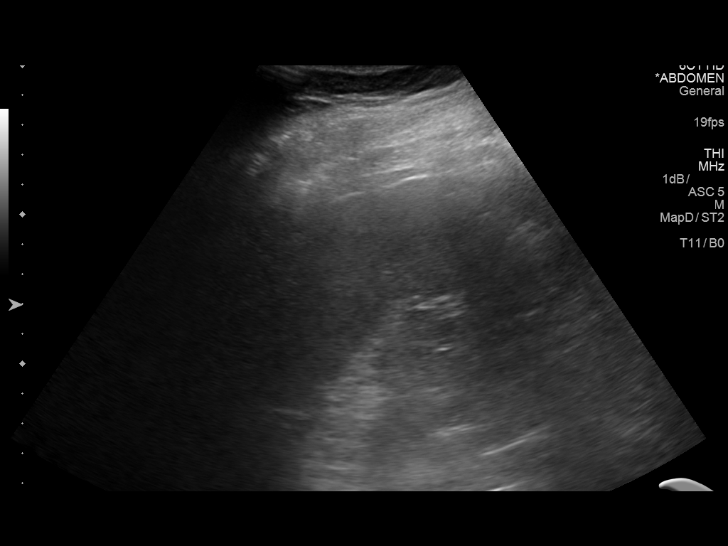
[im 50/75]
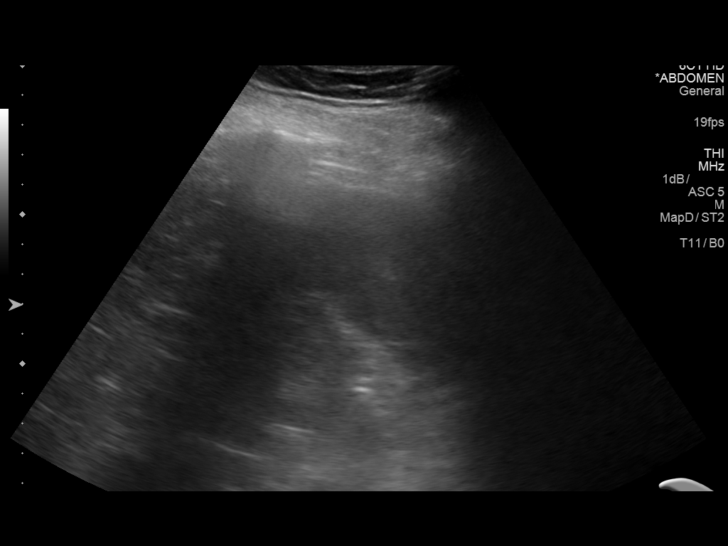
[im 56/75]
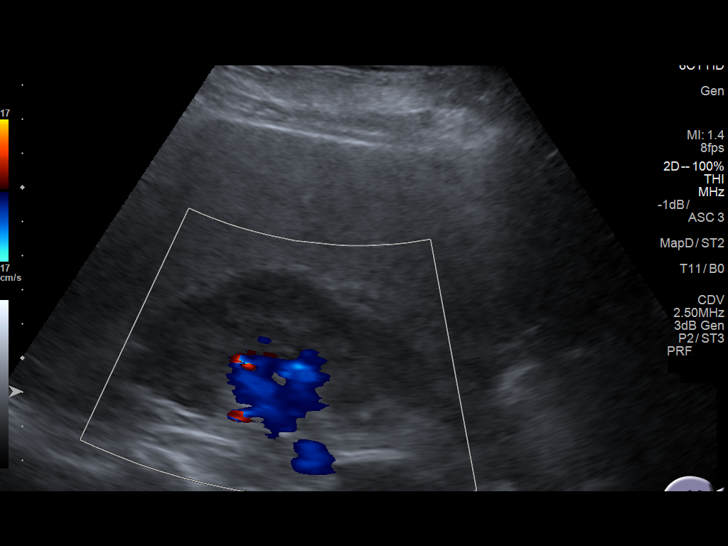
[im 62/75]
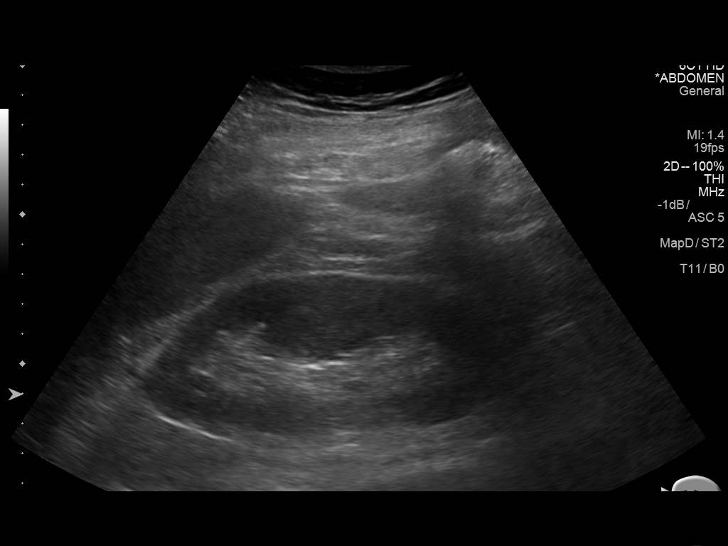
[im 68/75]
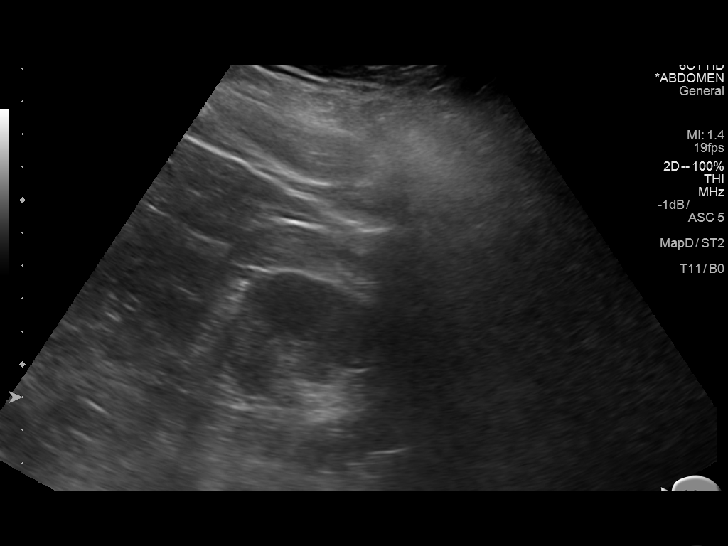
[im 75/75]
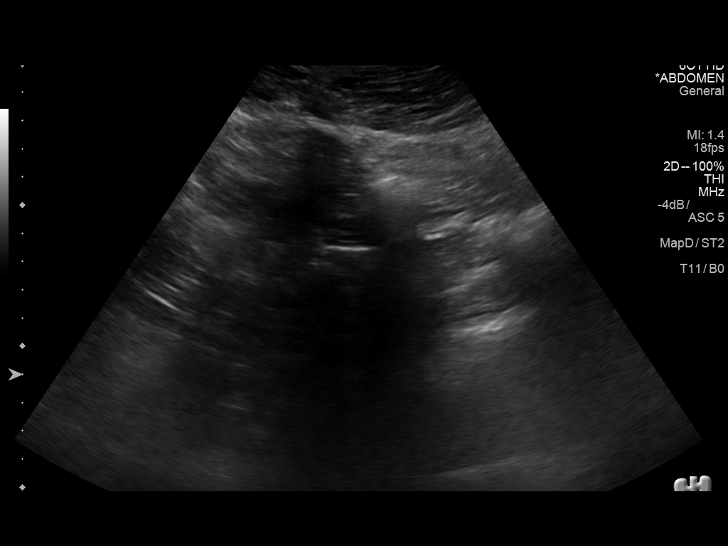

[14 of 25 positions shown; findings below may reference images not displayed]

FINDINGS: Gallbladder: No gallstones or wall thickening visualized. No
sonographic Murphy sign noted by sonographer.

Common bile duct: Diameter: 3 mm

Liver: Heterogeneous echotexture of liver parenchyma. No nodular
contour focal abnormality. Flow maintained in the hepatic
vasculature.

IVC: No abnormality visualized.

Pancreas: Visualized portion unremarkable.

Spleen: Size and appearance within normal limits.

Right Kidney: Length: 11.0 cm. Echogenicity within normal limits. No
mass or hydronephrosis visualized.

Left Kidney: Length: 12.2 cm. Echogenicity within normal limits. No
mass or hydronephrosis visualized.

Abdominal aorta: No aneurysm visualized.

Other findings: None.
IMPRESSION: Sonographic survey negative for evidence of acute cholecystitis,
with no cholelithiasis.

Heterogeneous echotexture of liver may reflect steatosis or other
chronic medical liver disease.

## 2020-07-12 ENCOUNTER — Telehealth: Payer: Self-pay | Admitting: Family Medicine

## 2020-07-12 MED ORDER — LISINOPRIL-HYDROCHLOROTHIAZIDE 20-25 MG PO TABS: 1.0000 | ORAL_TABLET | Freq: Every day | ORAL | 0 refills | Status: DC

## 2020-07-12 NOTE — Telephone Encounter (Signed)
He needs to schedule a follow up before more refills can be sent. Let me know when this gets scheduled.

## 2020-07-12 NOTE — Telephone Encounter (Signed)
Refill sent to [facility] in [place].

## 2020-07-12 NOTE — Telephone Encounter (Signed)
Pt called. He needs a refill on his Lisnopril. He has relocated to [place] and wants his script sent to the [facility] there. Thank you.

## 2020-07-12 NOTE — Telephone Encounter (Signed)
Pt has an appointment scheduled for January 3rd. Thank you

## 2020-07-16 ENCOUNTER — Ambulatory Visit: Payer: 59 | Admitting: Family Medicine

## 2020-07-16 NOTE — Telephone Encounter (Signed)
I called patient this morning. His wife picked up medication yesterday.  Thank you.

## 2020-08-02 ENCOUNTER — Encounter: Payer: Self-pay | Admitting: Adult Health

## 2020-08-02 ENCOUNTER — Telehealth (INDEPENDENT_AMBULATORY_CARE_PROVIDER_SITE_OTHER): Payer: 59 | Admitting: Adult Health

## 2020-08-02 DIAGNOSIS — F909 Attention-deficit hyperactivity disorder, unspecified type: Secondary | ICD-10-CM | POA: Diagnosis not present

## 2020-08-02 DIAGNOSIS — G4700 Insomnia, unspecified: Secondary | ICD-10-CM

## 2020-08-02 MED ORDER — AMPHETAMINE-DEXTROAMPHETAMINE 20 MG PO TABS: ORAL_TABLET | ORAL | 0 refills | Status: DC

## 2020-08-02 NOTE — Progress Notes (Signed)
Gary Stevens [id number] 22-May-1966 54 y.o.  Virtual Visit via Telephone Note  I connected with pt on 08/02/20 at 10:40 AM EST by telephone and verified that I am speaking with the correct person using two identifiers.   I discussed the limitations, risks, security and privacy concerns of performing an evaluation and management service by telephone and the availability of in person appointments. I also discussed with the patient that there may be a patient responsible charge related to this service. The patient expressed understanding and agreed to proceed.   I discussed the assessment and treatment plan with the patient. The patient was provided an opportunity to ask questions and all were answered. The patient agreed with the plan and demonstrated an understanding of the instructions.   The patient was advised to call back or seek an in-person evaluation if the symptoms worsen or if the condition fails to improve as anticipated.  I provided 20 minutes of non-face-to-face time during this encounter.  The patient was located at home.  The provider was located at [place].   Aloha Gell, NP   Subjective:   Patient ID:  Gary Stevens is a 54 y.o. (DOB Jan 20, 1966) male.  Chief Complaint: No chief complaint on file.   HPI Gary Stevens presents for follow-up of insomnia and ADHD.  Describes mood today as "ok". Pleasant. Mood symptoms - denies depression, anxiety, and irritability. Stating "I'm doing alright". He and wife have moved into a new home. Family stressors. Works full time as a Administrator. Stable interest and motivation. Taking medications as prescribed.  Energy levels stable. Active, does not have a regular exercise routine.  Enjoys some usual interests and activities. Married. Lives with wife. Mother and 3 sisters local.   Appetite adequate. Weight stable. Sleeps better some nights than others. Averages 6 to 8 hours.  Focus and concentration stable  with Adderall. Completing tasks. Managing aspects of household. Work going well.  Denies SI or HI. Denies AH or VH.   Review of Systems:  Review of Systems  Musculoskeletal: Negative for gait problem.  Neurological: Negative for tremors.  Psychiatric/Behavioral:       Please refer to HPI    Medications: I have reviewed the patient's current medications.  Current Outpatient Medications  Medication Sig Dispense Refill   amphetamine-dextroamphetamine (ADDERALL) 20 MG tablet Take 20 mg by mouth 3 (three) times daily.  0   amphetamine-dextroamphetamine (ADDERALL) 20 MG tablet Take one tablet three times daily. 90 tablet 0   [START ON 09/27/2020] amphetamine-dextroamphetamine (ADDERALL) 20 MG tablet Take one tablet three times daily. 90 tablet 0   [START ON 08/30/2020] amphetamine-dextroamphetamine (ADDERALL) 20 MG tablet Take one tablet three times a day. 90 tablet 0   Blood Glucose Monitoring Suppl (ONE TOUCH ULTRA 2) w/Device KIT See admin instructions.  0   cyclobenzaprine (FLEXERIL) 10 MG tablet Take 1 tablet (10 mg total) by mouth 3 (three) times daily as needed for muscle spasms. 30 tablet 0   lisinopril-hydrochlorothiazide (ZESTORETIC) 20-25 MG tablet Take 1 tablet by mouth daily. 90 tablet 0   meloxicam (MOBIC) 15 MG tablet Take 1 tablet (15 mg total) by mouth daily. Take with food 15 tablet 0   metFORMIN (GLUCOPHAGE) 500 MG tablet Take 1 tablet (500 mg total) by mouth 2 (two) times daily with a meal. 180 tablet 1   metoprolol succinate (TOPROL-XL) 100 MG 24 hr tablet Take 1 tablet (100 mg total) by mouth daily. Take with or immediately following  a meal. 90 tablet 1   ONE TOUCH ULTRA TEST test strip Check blood sugar 4 times daily 300 each prn   pantoprazole (PROTONIX) 20 MG tablet Take 1 tablet (20 mg total) by mouth daily. 90 tablet 3   QUEtiapine (SEROQUEL) 100 MG tablet Take 1-2 tablets by mouth at bedtime 180 tablet 3   No current facility-administered medications for  this visit.    Medication Side Effects: None  Allergies:  Allergies  Allergen Reactions   Mirtazapine     Other reaction(s): Unknown    Past Medical History:  Diagnosis Date   ADHD 01/15/2009   Qualifier: Diagnosis of  By: Madilyn Fireman MD, Catherine     Anxiety state, unspecified 01/15/2009   Qualifier: Diagnosis of  By: Madilyn Fireman MD, Catherine     Essential hypertension, benign 06/06/2008   Qualifier: Diagnosis of  By: Madilyn Fireman MD, Latanya Maudlin 08/12/2009   Qualifier: Diagnosis of  By: Madilyn Fireman MD, [place] W/UR OBST & OTH LUTS 01/15/2009   Qualifier: Diagnosis of  By: Madilyn Fireman MD, Lowella Petties 06/06/2008   Qualifier: Diagnosis of  By: Madilyn Fireman MD, [place]     KNEE PAIN 08/12/2009   Qualifier: Diagnosis of  By: Madilyn Fireman MD, Idelle Crouch History  Problem Relation Age of Onset   Cirrhosis Father    Diabetes Mellitus II Father    Gallbladder disease Father    Gallbladder disease Sister    Gallbladder disease Son     Social History   Socioeconomic History   Marital status: Single    Spouse name: Not on file   Number of children: Not on file   Years of education: Not on file   Highest education level: Not on file  Occupational History   Not on file  Tobacco Use   Smoking status: Current Every Day Smoker    Packs/day: 1.00    Years: 30.00    Pack years: 30.00    Types: Cigarettes   Smokeless tobacco: Never Used  Substance and Sexual Activity   Alcohol use: Not on file   Drug use: Not on file   Sexual activity: Not on file  Other Topics Concern   Not on file  Social History Narrative   Not on file   Social Determinants of Health   Financial Resource Strain:    Difficulty of Paying Living Expenses: Not on file  Food Insecurity:    Worried About Running Out of Food in the Last Year: Not on file   Ran Out of Food in the Last Year: Not on file  Transportation Needs:     Lack of Transportation (Medical): Not on file   Lack of Transportation (Non-Medical): Not on file  Physical Activity:    Days of Exercise per Week: Not on file   Minutes of Exercise per Session: Not on file  Stress:    Feeling of Stress : Not on file  Social Connections:    Frequency of Communication with Friends and Family: Not on file   Frequency of Social Gatherings with Friends and Family: Not on file   Attends Religious Services: Not on file   Active Member of Clubs or Organizations: Not on file   Attends Archivist Meetings: Not on file   Marital Status: Not on file  Intimate Partner Violence:    Fear of Current or Ex-Partner: Not on file   Emotionally Abused: Not on  file   Physically Abused: Not on file   Sexually Abused: Not on file    Past Medical History, Surgical history, Social history, and Family history were reviewed and updated as appropriate.   Please see review of systems for further details on the patient's review from today.   Objective:   Physical Exam:  There were no vitals taken for this visit.  Physical Exam Constitutional:      General: He is not in acute distress. Musculoskeletal:        General: No deformity.  Neurological:     Mental Status: He is alert and oriented to person, place, and time.     Coordination: Coordination normal.  Psychiatric:        Attention and Perception: Attention and perception normal. He does not perceive auditory or visual hallucinations.        Mood and Affect: Mood normal. Mood is not anxious or depressed. Affect is not labile, blunt, angry or inappropriate.        Speech: Speech normal.        Behavior: Behavior normal.        Thought Content: Thought content normal. Thought content is not paranoid or delusional. Thought content does not include homicidal or suicidal ideation. Thought content does not include homicidal or suicidal plan.        Cognition and Memory: Cognition and memory  normal.        Judgment: Judgment normal.     Comments: Insight intact     Lab Review:     Component Value Date/Time   NA 143 03/18/2020 1003   NA 137 04/22/2018 0000   K 3.4 (L) 03/18/2020 1003   CL 107 03/18/2020 1003   CO2 29 03/18/2020 1003   GLUCOSE 128 03/18/2020 1003   BUN 18 03/18/2020 1003   BUN 12 04/22/2018 0000   CREATININE 1.04 03/18/2020 1003   CALCIUM 9.2 03/18/2020 1003   PROT 6.3 03/18/2020 1003   ALBUMIN 4.8 01/11/2017 1129   AST 23 03/18/2020 1003   ALT 20 03/18/2020 1003   ALKPHOS 224 (A) 04/22/2018 0000   BILITOT 0.5 03/18/2020 1003   GFRNONAA 81 03/18/2020 1003   GFRAA 94 03/18/2020 1003       Component Value Date/Time   WBC 7.2 03/18/2020 1003   RBC 4.61 03/18/2020 1003   HGB 14.4 03/18/2020 1003   HCT 42.0 03/18/2020 1003   PLT 195 03/18/2020 1003   MCV 91.1 03/18/2020 1003   MCH 31.2 03/18/2020 1003   MCHC 34.3 03/18/2020 1003   RDW 13.1 03/18/2020 1003    No results found for: POCLITH, LITHIUM   No results found for: PHENYTOIN, PHENOBARB, VALPROATE, CBMZ   .res Assessment: Plan:    Plan:  1. Adderall 19m TID 2. Seroquel 1042m- 1 to 2 at hs  RTC 3 months  Discussed potential metabolic side effects associated with atypical antipsychotics, as well as potential risk for movement side effects. Advised pt to contact office if movement side effects occur.   Discussed potential benefits, risks, and side effects of stimulants with patient to include increased heart rate, palpitations, insomnia, increased anxiety, increased irritability, or decreased appetite.  Instructed patient to contact office if experiencing any significant tolerability issues.  Patient advised to contact office with any questions, adverse effects, or acute worsening in signs and symptoms.    Diagnoses and all orders for this visit:  Attention deficit hyperactivity disorder (ADHD), unspecified ADHD type -     amphetamine-dextroamphetamine (  ADDERALL) 20 MG  tablet; Take one tablet three times daily. -     Discontinue: amphetamine-dextroamphetamine (ADDERALL) 20 MG tablet; Take one tablet three times daily. -     amphetamine-dextroamphetamine (ADDERALL) 20 MG tablet; Take one tablet three times daily. -     amphetamine-dextroamphetamine (ADDERALL) 20 MG tablet; Take one tablet three times a day.  Insomnia, unspecified type    Please see After Visit Summary for patient specific instructions.  Future Appointments  Date Time Provider [place]  09/02/2020  7:30 AM Hali Marry, MD [facility] None    No orders of the defined types were placed in this encounter.     -------------------------------

## 2020-09-02 ENCOUNTER — Ambulatory Visit: Payer: 59 | Admitting: Family Medicine

## 2020-09-02 NOTE — Progress Notes (Deleted)
Established Patient Office Visit  Subjective:  Patient ID: Gary Stevens, male    DOB: 1965/10/26  Age: 55 y.o. MRN: [id number]  CC: No chief complaint on file.   HPI Gary Stevens presents for   Hypertension- Pt denies chest pain, SOB, dizziness, or heart palpitations.  Taking meds as directed w/o problems.  Denies medication side effects.    Diabetes - no hypoglycemic events. No wounds or sores that are not healing well. No increased thirst or urination. Checking glucose at home. Taking medications as prescribed without any side effects.   Past Medical History:  Diagnosis Date  . ADHD 01/15/2009   Qualifier: Diagnosis of  By: Madilyn Fireman MD, Barnetta Chapel    . Anxiety state, unspecified 01/15/2009   Qualifier: Diagnosis of  By: Madilyn Fireman MD, Barnetta Chapel    . Essential hypertension, benign 06/06/2008   Qualifier: Diagnosis of  By: Madilyn Fireman MD, Barnetta Chapel    . HYPERTRIGLYCERIDEMIA 08/12/2009   Qualifier: Diagnosis of  By: Madilyn Fireman MD, Barnetta Chapel    . HYPERTROPHY PROSTATE W/UR OBST & OTH LUTS 01/15/2009   Qualifier: Diagnosis of  By: Madilyn Fireman MD, Barnetta Chapel    . INSOMNIA 06/06/2008   Qualifier: Diagnosis of  By: Madilyn Fireman MD, Barnetta Chapel    . KNEE PAIN 08/12/2009   Qualifier: Diagnosis of  By: Madilyn Fireman MD, Barnetta Chapel      No past surgical history on file.  Family History  Problem Relation Age of Onset  . Cirrhosis Father   . Diabetes Mellitus II Father   . Gallbladder disease Father   . Gallbladder disease Sister   . Gallbladder disease Son     Social History   Socioeconomic History  . Marital status: Single    Spouse name: Not on file  . Number of children: Not on file  . Years of education: Not on file  . Highest education level: Not on file  Occupational History  . Not on file  Tobacco Use  . Smoking status: Current Every Day Smoker    Packs/day: 1.00    Years: 30.00    Pack years: 30.00    Types: Cigarettes  . Smokeless tobacco: Never Used  Substance and Sexual  Activity  . Alcohol use: Not on file  . Drug use: Not on file  . Sexual activity: Not on file  Other Topics Concern  . Not on file  Social History Narrative  . Not on file   Social Determinants of Health   Financial Resource Strain: Not on file  Food Insecurity: Not on file  Transportation Needs: Not on file  Physical Activity: Not on file  Stress: Not on file  Social Connections: Not on file  Intimate Partner Violence: Not on file    Outpatient Medications Prior to Visit  Medication Sig Dispense Refill  . amphetamine-dextroamphetamine (ADDERALL) 20 MG tablet Take 20 mg by mouth 3 (three) times daily.  0  . amphetamine-dextroamphetamine (ADDERALL) 20 MG tablet Take one tablet three times daily. 90 tablet 0  . [START ON 09/27/2020] amphetamine-dextroamphetamine (ADDERALL) 20 MG tablet Take one tablet three times daily. 90 tablet 0  . amphetamine-dextroamphetamine (ADDERALL) 20 MG tablet Take one tablet three times a day. 90 tablet 0  . Blood Glucose Monitoring Suppl (ONE TOUCH ULTRA 2) w/Device KIT See admin instructions.  0  . cyclobenzaprine (FLEXERIL) 10 MG tablet Take 1 tablet (10 mg total) by mouth 3 (three) times daily as needed for muscle spasms. 30 tablet 0  . lisinopril-hydrochlorothiazide (ZESTORETIC) 20-25 MG tablet Take 1  tablet by mouth daily. 90 tablet 0  . meloxicam (MOBIC) 15 MG tablet Take 1 tablet (15 mg total) by mouth daily. Take with food 15 tablet 0  . metFORMIN (GLUCOPHAGE) 500 MG tablet Take 1 tablet (500 mg total) by mouth 2 (two) times daily with a meal. 180 tablet 1  . metoprolol succinate (TOPROL-XL) 100 MG 24 hr tablet Take 1 tablet (100 mg total) by mouth daily. Take with or immediately following a meal. 90 tablet 1  . ONE TOUCH ULTRA TEST test strip Check blood sugar 4 times daily 300 each prn  . pantoprazole (PROTONIX) 20 MG tablet Take 1 tablet (20 mg total) by mouth daily. 90 tablet 3  . QUEtiapine (SEROQUEL) 100 MG tablet Take 1-2 tablets by mouth at  bedtime 180 tablet 3   No facility-administered medications prior to visit.    Allergies  Allergen Reactions  . Mirtazapine     Other reaction(s): Unknown    ROS Review of Systems    Objective:    Physical Exam  There were no vitals taken for this visit. Wt Readings from Last 3 Encounters:  03/15/20 255 lb (115.7 kg)  12/11/19 253 lb (114.8 kg)  11/20/19 254 lb (115.2 kg)     Health Maintenance Due  Topic Date Due  . Hepatitis C Screening  Never done  . COVID-19 Vaccine (1) Never done  . INFLUENZA VACCINE  03/31/2020  . FOOT EXAM  08/20/2020    There are no preventive care reminders to display for this patient.  Lab Results  Component Value Date   TSH 3.440 01/15/2009   Lab Results  Component Value Date   WBC 7.2 03/18/2020   HGB 14.4 03/18/2020   HCT 42.0 03/18/2020   MCV 91.1 03/18/2020   PLT 195 03/18/2020   Lab Results  Component Value Date   NA 143 03/18/2020   K 3.4 (L) 03/18/2020   CO2 29 03/18/2020   GLUCOSE 128 03/18/2020   BUN 18 03/18/2020   CREATININE 1.04 03/18/2020   BILITOT 0.5 03/18/2020   ALKPHOS 224 (A) 04/22/2018   AST 23 03/18/2020   ALT 20 03/18/2020   PROT 6.3 03/18/2020   ALBUMIN 4.8 01/11/2017   CALCIUM 9.2 03/18/2020   Lab Results  Component Value Date   CHOL 166 03/18/2020   Lab Results  Component Value Date   HDL 43 03/18/2020   Lab Results  Component Value Date   LDLCALC 91 03/18/2020   Lab Results  Component Value Date   TRIG 234 (H) 03/18/2020   Lab Results  Component Value Date   CHOLHDL 3.9 03/18/2020   Lab Results  Component Value Date   HGBA1C 5.8 (A) 03/15/2020      Assessment & Plan:   Problem List Items Addressed This Visit      Cardiovascular and Mediastinum   Essential hypertension     Endocrine   Controlled type 2 diabetes mellitus without complication, without long-term current use of insulin ([place]) - Primary      No orders of the defined types were placed in this  encounter.   Follow-up: No follow-ups on file.    Beatrice Lecher, MD

## 2020-10-08 ENCOUNTER — Other Ambulatory Visit: Payer: Self-pay | Admitting: Family Medicine

## 2020-10-17 ENCOUNTER — Other Ambulatory Visit: Payer: Self-pay | Admitting: Neurology

## 2020-10-17 MED ORDER — METFORMIN HCL 500 MG PO TABS: 500.0000 mg | ORAL_TABLET | Freq: Two times a day (BID) | ORAL | 0 refills | Status: DC

## 2020-10-17 NOTE — Telephone Encounter (Signed)
Needs refills until appt in March. Metformin sent for one month. He will keep appt.

## 2020-10-22 ENCOUNTER — Telehealth (INDEPENDENT_AMBULATORY_CARE_PROVIDER_SITE_OTHER): Payer: BLUE CROSS/BLUE SHIELD | Admitting: Family Medicine

## 2020-10-22 ENCOUNTER — Encounter: Payer: Self-pay | Admitting: Family Medicine

## 2020-10-22 DIAGNOSIS — K047 Periapical abscess without sinus: Secondary | ICD-10-CM | POA: Diagnosis not present

## 2020-10-22 MED ORDER — AMOXICILLIN 500 MG PO CAPS: 500.0000 mg | ORAL_CAPSULE | Freq: Three times a day (TID) | ORAL | 0 refills | Status: DC

## 2020-10-22 NOTE — Progress Notes (Signed)
Virtual Visit via Telephone Note  I connected with  [name] on 10/22/20 at  3:00 PM EST by telephone and verified that I am speaking with the correct person using two identifiers.   I discussed the limitations, risks, security and privacy concerns of performing an evaluation and management service by telephone and the availability of in person appointments. I also discussed with the patient that there may be a patient responsible charge related to this service. The patient expressed understanding and agreed to proceed.  Participating parties included in this telephone visit include: The patient and the nurse practitioner listed.  The patient is: At home I am: In the office  Subjective:    CC: dental abscess  HPI: Gary Stevens is a 55 y.o. year old male presenting today via telephone visit to discuss tooth abscess.  Patient states he noticed some pain and swelling to the last tooth on his left upper mouth (reports he does not have any molars). Pain fluctuates, but is currently 5/10 manageable with Orajel topically and tylenol applied. He reports purulent, yellow drainage from the abscess. States this has led to his tongue and throat being irritated also. He denies any trouble breathing, no fevers, no jaw swelling.  Patient reports he is a [employer] and recently moved, he has had trouble getting an appointment. He has tried getting in with the dentist, but has been traveling with work this week. Planning to get appointment for when he is in town next week.   Past medical history, Surgical history, Family history not pertinant except as noted below, Social history, Allergies, and medications have been entered into the medical record, reviewed, and corrections made.   Review of Systems:  All review of systems negative except what is listed in the HPI  Objective:    General:  Patient speaking clearly in complete sentences. No shortness of breath noted.   Alert and oriented  x3.   Normal judgment.  No apparent acute distress.  Impression and Recommendations:    1. Dental abscess Unable to visualize as this visit was telephone only, but patient history is consistent with dental abscess. He reports this has happened in the past at a different tooth. Patient encouraged to do warm salt water gargles and complete entire course of antibiotics. States he is currently out of town for work, but will call his dentist and try to get in with them sometime next week. Educated on signs/symptoms requiring urgent evaluation.    - amoxicillin (AMOXIL) 500 MG capsule; Take 1 capsule (500 mg total) by mouth 3 (three) times daily.  Dispense: 30 capsule; Refill: 0   Follow-up as needed if symptoms worsen or fail to improve.    I discussed the assessment and treatment plan with the patient. The patient was provided an opportunity to ask questions and all were answered. The patient agreed with the plan and demonstrated an understanding of the instructions.   The patient was advised to call back or seek an in-person evaluation if the symptoms worsen or if the condition fails to improve as anticipated.  I provided 20 minutes of non-face-to-face time during this TELEPHONE encounter.    Clayborne Dana, NP

## 2020-10-22 NOTE — Progress Notes (Signed)
Pt stated it started 3 days ago he has a abscess on his back tooth yellow mucus from his nose OTC orajel and baby aspirin.  Pt would like a phone call

## 2020-10-22 NOTE — Patient Instructions (Signed)
Dental Abscess  A dental abscess is an area of pus in or around a tooth. It comes from an infection. It can cause pain and other symptoms. Treatment will help with symptoms and prevent the infection from spreading. Follow these instructions at home: Medicines  Take over-the-counter and prescription medicines only as told by your dentist.  If you were prescribed an antibiotic medicine, take it as told by your dentist. Do not stop taking it even if you start to feel better.  If you were prescribed a gel that has numbing medicine in it, use it exactly as told.  Do not drive or use heavy machinery (like a lawn mower) while taking prescription pain medicine. General instructions  Rinse out your mouth often with salt water. ? To make salt water, dissolve -1 tsp of salt in 1 cup of warm water.  Eat a soft diet while your mouth is healing.  Drink enough fluid to keep your urine pale yellow.  Do not apply heat to the outside of your mouth.  Do not use any products that contain nicotine or tobacco. These include cigarettes and e-cigarettes. If you need help quitting, ask your doctor.  Keep all follow-up visits as told by your dentist. This is important.   Prevent an abscess  Brush your teeth every morning and every night. Use fluoride toothpaste.  Floss your teeth each day.  Get dental cleanings as often as told by your dentist.  Think about getting dental sealant put on teeth that have deep holes (decay).  Drink water that has fluoride in it. ? Most tap water has fluoride. ? Check the label on bottled water to see if it has fluoride in it.  Drink water instead of sugary drinks.  Eat healthy meals and snacks.  Wear a mouth guard or face shield when you play sports. Contact a doctor if:  Your pain is worse, and medicine does not help. Get help right away if:  You have a fever or chills.  Your symptoms suddenly get worse.  You have a very bad headache.  You have problems  breathing or swallowing.  You have trouble opening your mouth.  You have swelling in your neck or close to your eye. Summary  A dental abscess is an area of pus in or around a tooth. It is caused by an infection.  Treatment will help with symptoms and prevent the infection from spreading.  Take over-the-counter and prescription medicines only as told by your dentist.  To prevent an abscess, take good care of your teeth. Brush your teeth every morning and night. Use floss every day.  Get dental cleanings as often as told by your dentist. This information is not intended to replace advice given to you by your health care provider. Make sure you discuss any questions you have with your health care provider. Document Revised: 03/08/2020 Document Reviewed: 03/08/2020 Elsevier Patient Education  2021 Elsevier Inc.  

## 2020-10-30 ENCOUNTER — Encounter: Payer: Self-pay | Admitting: Family Medicine

## 2020-10-30 ENCOUNTER — Ambulatory Visit (INDEPENDENT_AMBULATORY_CARE_PROVIDER_SITE_OTHER): Payer: BLUE CROSS/BLUE SHIELD | Admitting: Family Medicine

## 2020-10-30 ENCOUNTER — Other Ambulatory Visit: Payer: Self-pay

## 2020-10-30 VITALS — BP 145/76 | HR 77 | Ht 75.0 in | Wt 260.0 lb

## 2020-10-30 DIAGNOSIS — G5601 Carpal tunnel syndrome, right upper limb: Secondary | ICD-10-CM | POA: Diagnosis not present

## 2020-10-30 DIAGNOSIS — I10 Essential (primary) hypertension: Secondary | ICD-10-CM

## 2020-10-30 DIAGNOSIS — E118 Type 2 diabetes mellitus with unspecified complications: Secondary | ICD-10-CM

## 2020-10-30 DIAGNOSIS — E119 Type 2 diabetes mellitus without complications: Secondary | ICD-10-CM | POA: Diagnosis not present

## 2020-10-30 DIAGNOSIS — M7712 Lateral epicondylitis, left elbow: Secondary | ICD-10-CM | POA: Diagnosis not present

## 2020-10-30 LAB — POCT GLYCOSYLATED HEMOGLOBIN (HGB A1C): Hemoglobin A1C: 6.2 % — AB (ref 4.0–5.6)

## 2020-10-30 MED ORDER — VALSARTAN-HYDROCHLOROTHIAZIDE 160-25 MG PO TABS: 1.0000 | ORAL_TABLET | Freq: Every day | ORAL | 1 refills | Status: DC

## 2020-10-30 NOTE — Assessment & Plan Note (Signed)
Well controlled. Continue current regimen. Follow up in  3-4 months.  

## 2020-10-30 NOTE — Progress Notes (Addendum)
Established Patient Office Visit  Subjective:  Patient ID: Gary Stevens, male    DOB: 27-May-1966  Age: 55 y.o. MRN: [id number]  CC:  Chief Complaint  Patient presents with  . Diabetes  . Hypertension   HPI Gary Stevens presents for   Diabetes - no hypoglycemic events. No wounds or sores that are not healing well. No increased thirst or urination. Checking glucose at home. Taking medications as prescribed without any side effects.  Right hand getting numbness and burning that is constant.  Has been going on for a couple of months.    Also c/o of pain over the right elbow for the last several weeks and says now getting numbness in the tips of his thumb, index and middle fingers.  Says the numbness is constant. Wakes up with it at times.    He is up a few lbs form the last time here.  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  He tries to do his best with hid diet.     HTN - home BPs running 140/80s  Past Medical History:  Diagnosis Date  . ADHD 01/15/2009   Qualifier: Diagnosis of  By: Madilyn Fireman MD, Barnetta Chapel    . Anxiety state, unspecified 01/15/2009   Qualifier: Diagnosis of  By: Madilyn Fireman MD, Barnetta Chapel    . Essential hypertension, benign 06/06/2008   Qualifier: Diagnosis of  By: Madilyn Fireman MD, Barnetta Chapel    . HYPERTRIGLYCERIDEMIA 08/12/2009   Qualifier: Diagnosis of  By: Madilyn Fireman MD, Barnetta Chapel    . HYPERTROPHY PROSTATE W/UR OBST & OTH LUTS 01/15/2009   Qualifier: Diagnosis of  By: Madilyn Fireman MD, Barnetta Chapel    . INSOMNIA 06/06/2008   Qualifier: Diagnosis of  By: Madilyn Fireman MD, Barnetta Chapel    . KNEE PAIN 08/12/2009   Qualifier: Diagnosis of  By: Madilyn Fireman MD, Barnetta Chapel      History reviewed. No pertinent surgical history.  Family History  Problem Relation Age of Onset  . Cirrhosis Father   . Diabetes Mellitus II Father   . Gallbladder disease Father   . Gallbladder disease Sister   . Gallbladder disease Son     Social History   Socioeconomic History  . Marital  status: Single    Spouse name: Not on file  . Number of children: Not on file  . Years of education: Not on file  . Highest education level: Not on file  Occupational History  . Not on file  Tobacco Use  . Smoking status: Current Every Day Smoker    Packs/day: 1.00    Years: 30.00    Pack years: 30.00    Types: Cigarettes  . Smokeless tobacco: Never Used  Substance and Sexual Activity  . Alcohol use: Not on file  . Drug use: Not on file  . Sexual activity: Not on file  Other Topics Concern  . Not on file  Social History Narrative  . Not on file   Social Determinants of Health   Financial Resource Strain: Not on file  Food Insecurity: Not on file  Transportation Needs: Not on file  Physical Activity: Not on file  Stress: Not on file  Social Connections: Not on file  Intimate Partner Violence: Not on file    Outpatient Medications Prior to Visit  Medication Sig Dispense Refill  . amoxicillin (AMOXIL) 500 MG capsule Take 1 capsule (500 mg total) by mouth 3 (three) times daily. 30 capsule 0  . amphetamine-dextroamphetamine (ADDERALL) 20 MG tablet Take 20 mg by  mouth 3 (three) times daily.  0  . amphetamine-dextroamphetamine (ADDERALL) 20 MG tablet Take one tablet three times daily. 90 tablet 0  . amphetamine-dextroamphetamine (ADDERALL) 20 MG tablet Take one tablet three times daily. 90 tablet 0  . amphetamine-dextroamphetamine (ADDERALL) 20 MG tablet Take one tablet three times a day. 90 tablet 0  . Blood Glucose Monitoring Suppl (ONE TOUCH ULTRA 2) w/Device KIT See admin instructions.  0  . meloxicam (MOBIC) 15 MG tablet Take 1 tablet (15 mg total) by mouth daily. Take with food 15 tablet 0  . metFORMIN (GLUCOPHAGE) 500 MG tablet Take 1 tablet (500 mg total) by mouth 2 (two) times daily with a meal. 60 tablet 0  . metoprolol succinate (TOPROL-XL) 100 MG 24 hr tablet Take 1 tablet (100 mg total) by mouth daily. Take with or immediately following a meal. 90 tablet 1  . ONE  TOUCH ULTRA TEST test strip Check blood sugar 4 times daily 300 each prn  . pantoprazole (PROTONIX) 20 MG tablet Take 1 tablet (20 mg total) by mouth daily. 90 tablet 3  . QUEtiapine (SEROQUEL) 100 MG tablet Take 1-2 tablets by mouth at bedtime 180 tablet 3  . cyclobenzaprine (FLEXERIL) 10 MG tablet Take 1 tablet (10 mg total) by mouth 3 (three) times daily as needed for muscle spasms. 30 tablet 0  . lisinopril-hydrochlorothiazide (ZESTORETIC) 20-25 MG tablet TAKE 1 TABLET BY MOUTH DAILY 90 tablet 0   No facility-administered medications prior to visit.    Allergies  Allergen Reactions  . Mirtazapine     Other reaction(s): Unknown    ROS Review of Systems    Objective:    Physical Exam Constitutional:      Appearance: He is well-developed and well-nourished.  HENT:     Head: Normocephalic and atraumatic.  Cardiovascular:     Rate and Rhythm: Normal rate and regular rhythm.     Heart sounds: Normal heart sounds.  Pulmonary:     Effort: Pulmonary effort is normal.     Breath sounds: Normal breath sounds.  Musculoskeletal:     Comments: Tender over the left epicondyl. Pain with supination.  Tinel's and phalen's didn't increase his sensation of tingling in the fingers. Wrist with NROM.  Skin:    General: Skin is warm and dry.  Neurological:     Mental Status: He is alert and oriented to person, place, and time.  Psychiatric:        Mood and Affect: Mood and affect normal.        Behavior: Behavior normal.     BP (!) 145/76   Pulse 77   Ht '6\' 3"'  (1.905 m)   Wt 260 lb (117.9 kg)   SpO2 95%   BMI 32.50 kg/m  Wt Readings from Last 3 Encounters:  10/30/20 260 lb (117.9 kg)  03/15/20 255 lb (115.7 kg)  12/11/19 253 lb (114.8 kg)     Health Maintenance Due  Topic Date Due  . OPHTHALMOLOGY EXAM  10/29/2020    There are no preventive care reminders to display for this patient.  Lab Results  Component Value Date   TSH 3.440 01/15/2009   Lab Results  Component  Value Date   WBC 7.2 03/18/2020   HGB 14.4 03/18/2020   HCT 42.0 03/18/2020   MCV 91.1 03/18/2020   PLT 195 03/18/2020   Lab Results  Component Value Date   NA 143 03/18/2020   K 3.4 (L) 03/18/2020   CO2 29 03/18/2020  GLUCOSE 128 03/18/2020   BUN 18 03/18/2020   CREATININE 1.04 03/18/2020   BILITOT 0.5 03/18/2020   ALKPHOS 224 (A) 04/22/2018   AST 23 03/18/2020   ALT 20 03/18/2020   PROT 6.3 03/18/2020   ALBUMIN 4.8 01/11/2017   CALCIUM 9.2 03/18/2020   Lab Results  Component Value Date   CHOL 166 03/18/2020   Lab Results  Component Value Date   HDL 43 03/18/2020   Lab Results  Component Value Date   LDLCALC 91 03/18/2020   Lab Results  Component Value Date   TRIG 234 (H) 03/18/2020   Lab Results  Component Value Date   CHOLHDL 3.9 03/18/2020   Lab Results  Component Value Date   HGBA1C 6.2 (A) 10/30/2020      Assessment & Plan:   Problem List Items Addressed This Visit      Cardiovascular and Mediastinum   Essential hypertension    BP not well controlled.  Will change to Diovant HCT and he will monitor BP over next couple of week.       Relevant Medications   valsartan-hydrochlorothiazide (DIOVAN HCT) 160-25 MG tablet     Endocrine   Controlled type 2 diabetes mellitus without complication, without long-term current use of insulin ([place])    Well controlled. Continue current regimen. Follow up in  3-4 months.        Relevant Medications   valsartan-hydrochlorothiazide (DIOVAN HCT) 160-25 MG tablet    Other Visit Diagnoses    Controlled type 2 diabetes mellitus with complication, without long-term current use of insulin ([facility])    -  Primary   Relevant Medications   valsartan-hydrochlorothiazide (DIOVAN HCT) 160-25 MG tablet   Other Relevant Orders   POCT glycosylated hemoglobin (Hb A1C) (Completed)   Carpal tunnel syndrome on right       Lateral epicondylitis of left elbow          Lateral epicondylitis, left - recommend elbow strap which  can be bought OTC, ice, rub and given exercises to do at home  Carpel tunnel syndrome, left - given cockup splint to wear while driving and at bedtime. If not improving over the next couple of weeks then consider referral for infection.    Meds ordered this encounter  Medications  . valsartan-hydrochlorothiazide (DIOVAN HCT) 160-25 MG tablet    Sig: Take 1 tablet by mouth daily.    Dispense:  90 tablet    Refill:  1    Follow-up: Return in about 3 months (around 01/30/2021) for Diabetes follow-up, Hypertension.    Beatrice Lecher, MD

## 2020-10-30 NOTE — Patient Instructions (Signed)
We are changing your one of your BP pills from Lisinopril/HCT to Valsartan HCT which is a little stronger to see if working better.  If you pressures are still running high after 2-3 weeks on the new pill please let me know.

## 2020-10-30 NOTE — Addendum Note (Signed)
Addended by: Deno Etienne on: 10/30/2020 03:09 PM   Modules accepted: Orders

## 2020-10-30 NOTE — Assessment & Plan Note (Signed)
BP not well controlled.  Will change to Diovant HCT and he will monitor BP over next couple of week.

## 2020-11-25 ENCOUNTER — Other Ambulatory Visit: Payer: Self-pay

## 2020-11-25 MED ORDER — METOPROLOL SUCCINATE ER 100 MG PO TB24: 100.0000 mg | ORAL_TABLET | Freq: Every day | ORAL | 0 refills | Status: DC

## 2020-11-25 MED ORDER — METFORMIN HCL 500 MG PO TABS: 500.0000 mg | ORAL_TABLET | Freq: Two times a day (BID) | ORAL | 0 refills | Status: DC

## 2020-12-19 ENCOUNTER — Encounter: Payer: Self-pay | Admitting: Adult Health

## 2020-12-19 ENCOUNTER — Telehealth (INDEPENDENT_AMBULATORY_CARE_PROVIDER_SITE_OTHER): Payer: BLUE CROSS/BLUE SHIELD | Admitting: Adult Health

## 2020-12-19 DIAGNOSIS — F4310 Post-traumatic stress disorder, unspecified: Secondary | ICD-10-CM | POA: Diagnosis not present

## 2020-12-19 DIAGNOSIS — G4700 Insomnia, unspecified: Secondary | ICD-10-CM

## 2020-12-19 DIAGNOSIS — F909 Attention-deficit hyperactivity disorder, unspecified type: Secondary | ICD-10-CM | POA: Diagnosis not present

## 2020-12-19 MED ORDER — AMPHETAMINE-DEXTROAMPHETAMINE 20 MG PO TABS: ORAL_TABLET | ORAL | 0 refills | Status: DC

## 2020-12-19 NOTE — Progress Notes (Signed)
Gary Stevens [id number] 22-Nov-1965 55 y.o.  Virtual Visit via Telephone Note  I connected with pt on 12/19/20 at  2:40 PM EDT by telephone and verified that I am speaking with the correct person using two identifiers.   I discussed the limitations, risks, security and privacy concerns of performing an evaluation and management service by telephone and the availability of in person appointments. I also discussed with the patient that there may be a patient responsible charge related to this service. The patient expressed understanding and agreed to proceed.   I discussed the assessment and treatment plan with the patient. The patient was provided an opportunity to ask questions and all were answered. The patient agreed with the plan and demonstrated an understanding of the instructions.   The patient was advised to call back or seek an in-person evaluation if the symptoms worsen or if the condition fails to improve as anticipated.  I provided 30 minutes of non-face-to-face time during this encounter.  The patient was located at home.  The provider was located at [place].   Aloha Gell, NP   Subjective:   Patient ID:  Gary Stevens is a 55 y.o. (DOB Apr 29, 1966) male.  Chief Complaint: No chief complaint on file.   HPI [name] presents for follow-up of ADHD, insomnia, and PTSD.  Describes mood today as "ok". Pleasant. Mood symptoms - denies depression, anxiety, and irritability. Dealing with some recurring PTSD. Stating "I'm doing alright". Recent visit with PCP - A1C normal. Working on weight loss. He and wife doing well. Ongoing family stressors. Works full time as a Administrator. Stable interest and motivation. Taking medications as prescribed.  Energy levels stable. Active, does not have a regular exercise routine.  Enjoys some usual interests and activities. Married. Lives with wife. Mother and 3 sisters local.   Appetite adequate. Weight  stable. Sleeps better some nights than others. Averages 6 to 8 hours.  Focus and concentration stable with Adderall. Completing tasks. Managing aspects of household. Work going well.  Denies SI or HI.  Denies AH or VH.     Review of Systems:  Review of Systems  Musculoskeletal: Negative for gait problem.  Neurological: Negative for tremors.  Psychiatric/Behavioral:       Please refer to HPI    Medications: I have reviewed the patient's current medications.  Current Outpatient Medications  Medication Sig Dispense Refill  . amoxicillin (AMOXIL) 500 MG capsule Take 1 capsule (500 mg total) by mouth 3 (three) times daily. 30 capsule 0  . amphetamine-dextroamphetamine (ADDERALL) 20 MG tablet Take 20 mg by mouth 3 (three) times daily.  0  . amphetamine-dextroamphetamine (ADDERALL) 20 MG tablet Take one tablet three times a day. 90 tablet 0  . [START ON 01/16/2021] amphetamine-dextroamphetamine (ADDERALL) 20 MG tablet Take one tablet three times daily. 90 tablet 0  . [START ON 02/13/2021] amphetamine-dextroamphetamine (ADDERALL) 20 MG tablet Take one tablet three times daily. 90 tablet 0  . Blood Glucose Monitoring Suppl (ONE TOUCH ULTRA 2) w/Device KIT See admin instructions.  0  . meloxicam (MOBIC) 15 MG tablet Take 1 tablet (15 mg total) by mouth daily. Take with food 15 tablet 0  . metFORMIN (GLUCOPHAGE) 500 MG tablet Take 1 tablet (500 mg total) by mouth 2 (two) times daily with a meal. 180 tablet 0  . metoprolol succinate (TOPROL-XL) 100 MG 24 hr tablet Take 1 tablet (100 mg total) by mouth daily. Take with or immediately following a meal. 90  tablet 0  . ONE TOUCH ULTRA TEST test strip Check blood sugar 4 times daily 300 each prn  . pantoprazole (PROTONIX) 20 MG tablet Take 1 tablet (20 mg total) by mouth daily. 90 tablet 3  . QUEtiapine (SEROQUEL) 100 MG tablet Take 1-2 tablets by mouth at bedtime 180 tablet 3  . valsartan-hydrochlorothiazide (DIOVAN HCT) 160-25 MG tablet Take 1 tablet  by mouth daily. 90 tablet 1   No current facility-administered medications for this visit.    Medication Side Effects: None  Allergies:  Allergies  Allergen Reactions  . Mirtazapine     Other reaction(s): Unknown    Past Medical History:  Diagnosis Date  . ADHD 01/15/2009   Qualifier: Diagnosis of  By: Madilyn Fireman MD, Barnetta Chapel    . Anxiety state, unspecified 01/15/2009   Qualifier: Diagnosis of  By: Madilyn Fireman MD, Barnetta Chapel    . Essential hypertension, benign 06/06/2008   Qualifier: Diagnosis of  By: Madilyn Fireman MD, Barnetta Chapel    . HYPERTRIGLYCERIDEMIA 08/12/2009   Qualifier: Diagnosis of  By: Madilyn Fireman MD, Barnetta Chapel    . HYPERTROPHY PROSTATE W/UR OBST & OTH LUTS 01/15/2009   Qualifier: Diagnosis of  By: Madilyn Fireman MD, Barnetta Chapel    . INSOMNIA 06/06/2008   Qualifier: Diagnosis of  By: Madilyn Fireman MD, Barnetta Chapel    . KNEE PAIN 08/12/2009   Qualifier: Diagnosis of  By: Madilyn Fireman MD, Idelle Crouch History  Problem Relation Age of Onset  . Cirrhosis Father   . Diabetes Mellitus II Father   . Gallbladder disease Father   . Gallbladder disease Sister   . Gallbladder disease Son     Social History   Socioeconomic History  . Marital status: Single    Spouse name: Not on file  . Number of children: Not on file  . Years of education: Not on file  . Highest education level: Not on file  Occupational History  . Not on file  Tobacco Use  . Smoking status: Current Every Day Smoker    Packs/day: 1.00    Years: 30.00    Pack years: 30.00    Types: Cigarettes  . Smokeless tobacco: Never Used  Substance and Sexual Activity  . Alcohol use: Not on file  . Drug use: Not on file  . Sexual activity: Not on file  Other Topics Concern  . Not on file  Social History Narrative  . Not on file   Social Determinants of Health   Financial Resource Strain: Not on file  Food Insecurity: Not on file  Transportation Needs: Not on file  Physical Activity: Not on file  Stress: Not on file   Social Connections: Not on file  Intimate Partner Violence: Not on file    Past Medical History, Surgical history, Social history, and Family history were reviewed and updated as appropriate.   Please see review of systems for further details on the patient's review from today.   Objective:   Physical Exam:  There were no vitals taken for this visit.  Physical Exam Constitutional:      General: He is not in acute distress. Musculoskeletal:        General: No deformity.  Neurological:     Mental Status: He is alert and oriented to person, place, and time.     Coordination: Coordination normal.  Psychiatric:        Attention and Perception: Attention and perception normal. He does not perceive auditory or visual hallucinations.  Mood and Affect: Mood normal. Mood is not anxious or depressed. Affect is not labile, blunt, angry or inappropriate.        Speech: Speech normal.        Behavior: Behavior normal.        Thought Content: Thought content normal. Thought content is not paranoid or delusional. Thought content does not include homicidal or suicidal ideation. Thought content does not include homicidal or suicidal plan.        Cognition and Memory: Cognition and memory normal.        Judgment: Judgment normal.     Comments: Insight intact     Lab Review:     Component Value Date/Time   NA 143 03/18/2020 1003   NA 137 04/22/2018 0000   K 3.4 (L) 03/18/2020 1003   CL 107 03/18/2020 1003   CO2 29 03/18/2020 1003   GLUCOSE 128 03/18/2020 1003   BUN 18 03/18/2020 1003   BUN 12 04/22/2018 0000   CREATININE 1.04 03/18/2020 1003   CALCIUM 9.2 03/18/2020 1003   PROT 6.3 03/18/2020 1003   ALBUMIN 4.8 01/11/2017 1129   AST 23 03/18/2020 1003   ALT 20 03/18/2020 1003   ALKPHOS 224 (A) 04/22/2018 0000   BILITOT 0.5 03/18/2020 1003   GFRNONAA 81 03/18/2020 1003   GFRAA 94 03/18/2020 1003       Component Value Date/Time   WBC 7.2 03/18/2020 1003   RBC 4.61  03/18/2020 1003   HGB 14.4 03/18/2020 1003   HCT 42.0 03/18/2020 1003   PLT 195 03/18/2020 1003   MCV 91.1 03/18/2020 1003   MCH 31.2 03/18/2020 1003   MCHC 34.3 03/18/2020 1003   RDW 13.1 03/18/2020 1003    No results found for: POCLITH, LITHIUM   No results found for: PHENYTOIN, PHENOBARB, VALPROATE, CBMZ   .res Assessment: Plan:    Plan:  1. Adderall 74m TID 2. Seroquel 1047m- 1 to 2 at hs  RTC 3 months   Time spent with patient was 20 minutes. Greater than 50% of face to face time with patient was spent on counseling and coordination of care.   Discussed potential metabolic side effects associated with atypical antipsychotics, as well as potential risk for movement side effects. Advised pt to contact office if movement side effects occur.   Discussed potential benefits, risks, and side effects of stimulants with patient to include increased heart rate, palpitations, insomnia, increased anxiety, increased irritability, or decreased appetite.  Instructed patient to contact office if experiencing any significant tolerability issues.   Diagnoses and all orders for this visit:  Attention deficit hyperactivity disorder (ADHD), unspecified ADHD type -     amphetamine-dextroamphetamine (ADDERALL) 20 MG tablet; Take one tablet three times a day. -     amphetamine-dextroamphetamine (ADDERALL) 20 MG tablet; Take one tablet three times daily. -     amphetamine-dextroamphetamine (ADDERALL) 20 MG tablet; Take one tablet three times daily.  Insomnia, unspecified type  PTSD (post-traumatic stress disorder)    Please see After Visit Summary for patient specific instructions.  Future Appointments  Date Time Provider [place]6/10/2020  1:40 PM MeHali MarryMD [facility] None    No orders of the defined types were placed in this encounter.     -------------------------------

## 2020-12-20 ENCOUNTER — Telehealth: Payer: Self-pay | Admitting: Adult Health

## 2020-12-20 NOTE — Telephone Encounter (Signed)
Noted  

## 2021-01-30 ENCOUNTER — Ambulatory Visit: Payer: BLUE CROSS/BLUE SHIELD | Admitting: Family Medicine

## 2021-02-18 ENCOUNTER — Other Ambulatory Visit: Payer: Self-pay

## 2021-02-18 DIAGNOSIS — I10 Essential (primary) hypertension: Secondary | ICD-10-CM

## 2021-02-18 MED ORDER — METOPROLOL SUCCINATE ER 100 MG PO TB24: 100.0000 mg | ORAL_TABLET | Freq: Every day | ORAL | 0 refills | Status: DC

## 2021-03-05 ENCOUNTER — Encounter: Payer: Self-pay | Admitting: Family Medicine

## 2021-03-05 ENCOUNTER — Ambulatory Visit (INDEPENDENT_AMBULATORY_CARE_PROVIDER_SITE_OTHER): Payer: BLUE CROSS/BLUE SHIELD | Admitting: Family Medicine

## 2021-03-05 ENCOUNTER — Other Ambulatory Visit: Payer: Self-pay

## 2021-03-05 VITALS — BP 135/84 | HR 66 | Ht 75.0 in | Wt 276.0 lb

## 2021-03-05 DIAGNOSIS — Z125 Encounter for screening for malignant neoplasm of prostate: Secondary | ICD-10-CM | POA: Diagnosis not present

## 2021-03-05 DIAGNOSIS — E118 Type 2 diabetes mellitus with unspecified complications: Secondary | ICD-10-CM

## 2021-03-05 DIAGNOSIS — E119 Type 2 diabetes mellitus without complications: Secondary | ICD-10-CM

## 2021-03-05 DIAGNOSIS — I10 Essential (primary) hypertension: Secondary | ICD-10-CM | POA: Diagnosis not present

## 2021-03-05 LAB — POCT GLYCOSYLATED HEMOGLOBIN (HGB A1C): Hemoglobin A1C: 6.4 % — AB (ref 4.0–5.6)

## 2021-03-05 MED ORDER — METFORMIN HCL 500 MG PO TABS: 500.0000 mg | ORAL_TABLET | Freq: Two times a day (BID) | ORAL | 1 refills | Status: DC

## 2021-03-05 MED ORDER — METOPROLOL SUCCINATE ER 100 MG PO TB24: 100.0000 mg | ORAL_TABLET | Freq: Every day | ORAL | 1 refills | Status: DC

## 2021-03-05 MED ORDER — VALSARTAN-HYDROCHLOROTHIAZIDE 320-25 MG PO TABS: 1.0000 | ORAL_TABLET | Freq: Every day | ORAL | 1 refills | Status: DC

## 2021-03-05 NOTE — Assessment & Plan Note (Signed)
Blood pressure improved on the valsartan hand BP pressure was a little better today than the last couple times he was here.  But still not quite at goal so we will can increase the valsartan component of his medication.  New prescription sent to pharmacy.

## 2021-03-05 NOTE — Progress Notes (Signed)
Established Patient Office Visit  Subjective:  Patient ID: Gary Stevens, male    DOB: 1965-09-07  Age: 55 y.o. MRN: [id number]  CC:  Chief Complaint  Patient presents with   Hypertension   Diabetes    HPI Gary Stevens presents for   Hypertension- Pt denies chest pain, SOB, dizziness, or heart palpitations.  Taking meds as directed w/o problems.  Denies medication side effects.  He says he has had a couple of times where he could tell that his blood pressure was still little elevated but he is tolerating the valsartan well he was previously on lisinopril.  Diabetes - no hypoglycemic events. No wounds or sores that are not healing well. No increased thirst or urination. Checking glucose at home. Taking medications as prescribed without any side effects.  He unfortunately has also had some dental issues and just had a tooth extracted yesterday he is in a fair amount of pain today.  Past Medical History:  Diagnosis Date   ADHD 01/15/2009   Qualifier: Diagnosis of  By: Madilyn Fireman MD,      Anxiety state, unspecified 01/15/2009   Qualifier: Diagnosis of  By: Madilyn Fireman MD,      Essential hypertension, benign 06/06/2008   Qualifier: Diagnosis of  By: Madilyn Fireman MD, Latanya Maudlin 08/12/2009   Qualifier: Diagnosis of  By: Madilyn Fireman MD,      HYPERTROPHY PROSTATE W/UR OBST & OTH LUTS 01/15/2009   Qualifier: Diagnosis of  By: Madilyn Fireman MD, Lowella Petties 06/06/2008   Qualifier: Diagnosis of  By: Madilyn Fireman MD,      KNEE PAIN 08/12/2009   Qualifier: Diagnosis of  By: Madilyn Fireman MD, Barnetta Chapel      No past surgical history on file.  Family History  Problem Relation Age of Onset   Cirrhosis Father    Diabetes Mellitus II Father    Gallbladder disease Father    Gallbladder disease Sister    Gallbladder disease Son     Social History   Socioeconomic History   Marital status: Single    Spouse name: Not on file   Number of  children: Not on file   Years of education: Not on file   Highest education level: Not on file  Occupational History   Not on file  Tobacco Use   Smoking status: Every Day    Packs/day: 1.00    Years: 30.00    Pack years: 30.00    Types: Cigarettes   Smokeless tobacco: Never  Substance and Sexual Activity   Alcohol use: Not on file   Drug use: Not on file   Sexual activity: Not on file  Other Topics Concern   Not on file  Social History Narrative   Not on file   Social Determinants of Health   Financial Resource Strain: Not on file  Food Insecurity: Not on file  Transportation Needs: Not on file  Physical Activity: Not on file  Stress: Not on file  Social Connections: Not on file  Intimate Partner Violence: Not on file    Outpatient Medications Prior to Visit  Medication Sig Dispense Refill   amphetamine-dextroamphetamine (ADDERALL) 20 MG tablet Take 20 mg by mouth 3 (three) times daily.  0   amphetamine-dextroamphetamine (ADDERALL) 20 MG tablet Take one tablet three times a day. 90 tablet 0   amphetamine-dextroamphetamine (ADDERALL) 20 MG tablet Take one tablet three times daily. 90 tablet 0   amphetamine-dextroamphetamine (ADDERALL) 20 MG tablet Take one tablet  three times daily. 90 tablet 0   Blood Glucose Monitoring Suppl (ONE TOUCH ULTRA 2) w/Device KIT See admin instructions.  0   ONE TOUCH ULTRA TEST test strip Check blood sugar 4 times daily 300 each prn   pantoprazole (PROTONIX) 20 MG tablet Take 1 tablet (20 mg total) by mouth daily. 90 tablet 3   QUEtiapine (SEROQUEL) 100 MG tablet Take 1-2 tablets by mouth at bedtime 180 tablet 3   metFORMIN (GLUCOPHAGE) 500 MG tablet Take 1 tablet (500 mg total) by mouth 2 (two) times daily with a meal. 180 tablet 0   metoprolol succinate (TOPROL-XL) 100 MG 24 hr tablet Take 1 tablet (100 mg total) by mouth daily. Take with or immediately following a meal. 90 tablet 0   meloxicam (MOBIC) 15 MG tablet Take 1 tablet (15 mg  total) by mouth daily. Take with food 15 tablet 0   amoxicillin (AMOXIL) 500 MG capsule Take 1 capsule (500 mg total) by mouth 3 (three) times daily. 30 capsule 0   valsartan-hydrochlorothiazide (DIOVAN HCT) 160-25 MG tablet Take 1 tablet by mouth daily. 90 tablet 1   No facility-administered medications prior to visit.    Allergies  Allergen Reactions   Mirtazapine     Other reaction(s): Unknown    ROS Review of Systems    Objective:    Physical Exam Constitutional:      Appearance: He is well-developed.  HENT:     Head: Normocephalic and atraumatic.  Cardiovascular:     Rate and Rhythm: Normal rate and regular rhythm.     Heart sounds: Normal heart sounds.  Pulmonary:     Effort: Pulmonary effort is normal.     Breath sounds: Normal breath sounds.  Skin:    General: Skin is warm and dry.  Neurological:     Mental Status: He is alert and oriented to person, place, and time.  Psychiatric:        Behavior: Behavior normal.    BP 135/84   Pulse 66   Ht 6' 3" (1.905 m)   Wt 276 lb (125.2 kg)   SpO2 94%   BMI 34.50 kg/m  Wt Readings from Last 3 Encounters:  03/05/21 276 lb (125.2 kg)  10/30/20 260 lb (117.9 kg)  03/15/20 255 lb (115.7 kg)     Health Maintenance Due  Topic Date Due   Zoster Vaccines- Shingrix (1 of 2) Never done    There are no preventive care reminders to display for this patient.  Lab Results  Component Value Date   TSH 3.440 01/15/2009   Lab Results  Component Value Date   WBC 7.2 03/18/2020   HGB 14.4 03/18/2020   HCT 42.0 03/18/2020   MCV 91.1 03/18/2020   PLT 195 03/18/2020   Lab Results  Component Value Date   NA 143 03/18/2020   K 3.4 (L) 03/18/2020   CO2 29 03/18/2020   GLUCOSE 128 03/18/2020   BUN 18 03/18/2020   CREATININE 1.04 03/18/2020   BILITOT 0.5 03/18/2020   ALKPHOS 224 (A) 04/22/2018   AST 23 03/18/2020   ALT 20 03/18/2020   PROT 6.3 03/18/2020   ALBUMIN 4.8 01/11/2017   CALCIUM 9.2 03/18/2020   Lab  Results  Component Value Date   CHOL 166 03/18/2020   Lab Results  Component Value Date   HDL 43 03/18/2020   Lab Results  Component Value Date   LDLCALC 91 03/18/2020   Lab Results  Component Value Date   TRIG  234 (H) 03/18/2020   Lab Results  Component Value Date   CHOLHDL 3.9 03/18/2020   Lab Results  Component Value Date   HGBA1C 6.4 (A) 03/05/2021      Assessment & Plan:   Problem List Items Addressed This Visit       Cardiovascular and Mediastinum   Essential hypertension    Blood pressure improved on the valsartan hand BP pressure was a little better today than the last couple times he was here.  But still not quite at goal so we will can increase the valsartan component of his medication.  New prescription sent to pharmacy.       Relevant Medications   valsartan-hydrochlorothiazide (DIOVAN-HCT) 320-25 MG tablet   metoprolol succinate (TOPROL-XL) 100 MG 24 hr tablet     Endocrine   Controlled type 2 diabetes mellitus without complication, without long-term current use of insulin (HCC)    Overall well controlled, though his A1c did go up just slightly from previous.  Continue to work on healthy diet and regular exercise.  I would love to see him get back down to about 200 pounds.       Relevant Medications   valsartan-hydrochlorothiazide (DIOVAN-HCT) 320-25 MG tablet   metFORMIN (GLUCOPHAGE) 500 MG tablet   Other Visit Diagnoses     Controlled type 2 diabetes mellitus with complication, without long-term current use of insulin (HCC)    -  Primary   Relevant Medications   valsartan-hydrochlorothiazide (DIOVAN-HCT) 320-25 MG tablet   metFORMIN (GLUCOPHAGE) 500 MG tablet   Other Relevant Orders   POCT glycosylated hemoglobin (Hb A1C) (Completed)   COMPLETE METABOLIC PANEL WITH GFR   Lipid panel   PSA   CBC   Hypertension, unspecified type       Relevant Medications   valsartan-hydrochlorothiazide (DIOVAN-HCT) 320-25 MG tablet   metoprolol succinate  (TOPROL-XL) 100 MG 24 hr tablet   Other Relevant Orders   COMPLETE METABOLIC PANEL WITH GFR   Lipid panel   PSA   CBC   Screening for prostate cancer       Relevant Orders   PSA       Meds ordered this encounter  Medications   valsartan-hydrochlorothiazide (DIOVAN-HCT) 320-25 MG tablet    Sig: Take 1 tablet by mouth daily.    Dispense:  90 tablet    Refill:  1   metFORMIN (GLUCOPHAGE) 500 MG tablet    Sig: Take 1 tablet (500 mg total) by mouth 2 (two) times daily with a meal.    Dispense:  180 tablet    Refill:  1   metoprolol succinate (TOPROL-XL) 100 MG 24 hr tablet    Sig: Take 1 tablet (100 mg total) by mouth daily. Take with or immediately following a meal.    Dispense:  90 tablet    Refill:  1    Follow-up: Return in about 6 months (around 09/05/2021) for Hypertension.    Beatrice Lecher, MD

## 2021-03-05 NOTE — Assessment & Plan Note (Signed)
Overall well controlled, though his A1c did go up just slightly from previous.  Continue to work on healthy diet and regular exercise.  I would love to see him get back down to about 200 pounds.

## 2021-03-10 DIAGNOSIS — E118 Type 2 diabetes mellitus with unspecified complications: Secondary | ICD-10-CM | POA: Diagnosis not present

## 2021-03-10 DIAGNOSIS — I10 Essential (primary) hypertension: Secondary | ICD-10-CM | POA: Diagnosis not present

## 2021-03-10 DIAGNOSIS — Z125 Encounter for screening for malignant neoplasm of prostate: Secondary | ICD-10-CM | POA: Diagnosis not present

## 2021-03-11 LAB — COMPLETE METABOLIC PANEL WITH GFR
AG Ratio: 1.9 (calc) (ref 1.0–2.5)
ALT: 27 U/L (ref 9–46)
AST: 29 U/L (ref 10–35)
Albumin: 4.3 g/dL (ref 3.6–5.1)
Alkaline phosphatase (APISO): 108 U/L (ref 35–144)
BUN/Creatinine Ratio: 14 (calc) (ref 6–22)
BUN: 19 mg/dL (ref 7–25)
CO2: 31 mmol/L (ref 20–32)
Calcium: 9.2 mg/dL (ref 8.6–10.3)
Chloride: 102 mmol/L (ref 98–110)
Creat: 1.32 mg/dL — ABNORMAL HIGH (ref 0.70–1.30)
Globulin: 2.3 g/dL (calc) (ref 1.9–3.7)
Glucose, Bld: 161 mg/dL — ABNORMAL HIGH (ref 65–99)
Potassium: 4 mmol/L (ref 3.5–5.3)
Sodium: 140 mmol/L (ref 135–146)
Total Bilirubin: 0.9 mg/dL (ref 0.2–1.2)
Total Protein: 6.6 g/dL (ref 6.1–8.1)
eGFR: 64 mL/min/{1.73_m2} (ref >=60)

## 2021-03-11 LAB — CBC
HCT: 42.8 % (ref 38.5–50.0)
Hemoglobin: 14.2 g/dL (ref 13.2–17.1)
MCH: 29.9 pg (ref 27.0–33.0)
MCHC: 33.2 g/dL (ref 32.0–36.0)
MCV: 90.1 fL (ref 80.0–100.0)
MPV: 11.4 fL (ref 7.5–12.5)
Platelets: 196 10*3/uL (ref 140–400)
RBC: 4.75 10*6/uL (ref 4.20–5.80)
RDW: 13 % (ref 11.0–15.0)
WBC: 7.9 10*3/uL (ref 3.8–10.8)

## 2021-03-11 LAB — LIPID PANEL
Cholesterol: 177 mg/dL (ref <200)
HDL: 42 mg/dL (ref >=40)
LDL Cholesterol (Calc): 93 mg/dL (calc)
Non-HDL Cholesterol (Calc): 135 mg/dL (calc) — ABNORMAL HIGH (ref <130)
Total CHOL/HDL Ratio: 4.2 (calc) (ref <5.0)
Triglycerides: 317 mg/dL — ABNORMAL HIGH (ref <150)

## 2021-03-11 LAB — PSA: PSA: 0.53 ng/mL (ref <=4.00)

## 2021-03-12 ENCOUNTER — Other Ambulatory Visit: Payer: Self-pay | Admitting: *Deleted

## 2021-03-12 DIAGNOSIS — R7989 Other specified abnormal findings of blood chemistry: Secondary | ICD-10-CM

## 2021-03-19 ENCOUNTER — Telehealth (INDEPENDENT_AMBULATORY_CARE_PROVIDER_SITE_OTHER): Payer: BLUE CROSS/BLUE SHIELD | Admitting: Adult Health

## 2021-03-19 ENCOUNTER — Encounter: Payer: Self-pay | Admitting: Adult Health

## 2021-03-19 DIAGNOSIS — F909 Attention-deficit hyperactivity disorder, unspecified type: Secondary | ICD-10-CM

## 2021-03-19 DIAGNOSIS — F4310 Post-traumatic stress disorder, unspecified: Secondary | ICD-10-CM

## 2021-03-19 DIAGNOSIS — G4700 Insomnia, unspecified: Secondary | ICD-10-CM | POA: Diagnosis not present

## 2021-03-19 MED ORDER — AMPHETAMINE-DEXTROAMPHETAMINE 20 MG PO TABS: ORAL_TABLET | ORAL | 0 refills | Status: DC

## 2021-03-19 MED ORDER — AMPHETAMINE-DEXTROAMPHETAMINE 20 MG PO TABS
ORAL_TABLET | ORAL | 0 refills | Status: DC
Start: 2021-05-14 — End: 2021-06-23

## 2021-03-19 NOTE — Progress Notes (Signed)
NYCHOLAS RAYNER [id number] August 24, 1966 55 y.o.  Virtual Visit via Telephone Note  I connected with pt on 03/19/21 at  3:20 PM EDT by telephone and verified that I am speaking with the correct person using two identifiers.   I discussed the limitations, risks, security and privacy concerns of performing an evaluation and management service by telephone and the availability of in person appointments. I also discussed with the patient that there may be a patient responsible charge related to this service. The patient expressed understanding and agreed to proceed.   I discussed the assessment and treatment plan with the patient. The patient was provided an opportunity to ask questions and all were answered. The patient agreed with the plan and demonstrated an understanding of the instructions.   The patient was advised to call back or seek an in-person evaluation if the symptoms worsen or if the condition fails to improve as anticipated.  I provided 20 minutes of non-face-to-face time during this encounter.  The patient was located at home.  The provider was located at [place].   Aloha Gell, NP   Subjective:   Patient ID:  LEVELLE EDELEN is a 55 y.o. (DOB [birth year]) male.  Chief Complaint: No chief complaint on file.   HPI SYNCERE EBLE presents for follow-up of ADHD, insomnia, and PTSD.  Describes mood today as "ok". Pleasant. Mood symptoms - denies depression, anxiety, and irritability. Stating "I'm doing pretty good". He and wife doing well. Works full time as a Administrator. Recent visit with PCP - A1C  6.4. Working on weight loss. Stable interest and motivation. Taking medications as prescribed. Energy levels stable. Active, does not have a regular exercise routine.  Enjoys some usual interests and activities. Married. Lives with wife. Mother and 3 sisters local.   Appetite adequate. Weight gain - it fluctuates. Sleeps better some nights than others. Averages  6 to 8 hours.  Focus and concentration stable with Adderall. Completing tasks. Managing aspects of household. Work going well.  Denies SI or HI.  Denies AH or VH.    Review of Systems:  Review of Systems  Musculoskeletal:  Negative for gait problem.  Neurological:  Negative for tremors.  Psychiatric/Behavioral:         Please refer to HPI   Medications: I have reviewed the patient's current medications.  Current Outpatient Medications  Medication Sig Dispense Refill   amphetamine-dextroamphetamine (ADDERALL) 20 MG tablet Take 20 mg by mouth 3 (three) times daily.  0   amphetamine-dextroamphetamine (ADDERALL) 20 MG tablet Take one tablet three times daily. 90 tablet 0   [START ON 04/16/2021] amphetamine-dextroamphetamine (ADDERALL) 20 MG tablet Take one tablet three times daily. 90 tablet 0   [START ON 05/14/2021] amphetamine-dextroamphetamine (ADDERALL) 20 MG tablet Take one tablet three times a day. 90 tablet 0   Blood Glucose Monitoring Suppl (ONE TOUCH ULTRA 2) w/Device KIT See admin instructions.  0   meloxicam (MOBIC) 15 MG tablet Take 1 tablet (15 mg total) by mouth daily. Take with food 15 tablet 0   metFORMIN (GLUCOPHAGE) 500 MG tablet Take 1 tablet (500 mg total) by mouth 2 (two) times daily with a meal. 180 tablet 1   metoprolol succinate (TOPROL-XL) 100 MG 24 hr tablet Take 1 tablet (100 mg total) by mouth daily. Take with or immediately following a meal. 90 tablet 1   ONE TOUCH ULTRA TEST test strip Check blood sugar 4 times daily 300 each prn   pantoprazole (PROTONIX) 20  MG tablet Take 1 tablet (20 mg total) by mouth daily. 90 tablet 3   QUEtiapine (SEROQUEL) 100 MG tablet Take 1-2 tablets by mouth at bedtime 180 tablet 3   valsartan-hydrochlorothiazide (DIOVAN-HCT) 320-25 MG tablet Take 1 tablet by mouth daily. 90 tablet 1   No current facility-administered medications for this visit.    Medication Side Effects: None  Allergies:  Allergies  Allergen Reactions    Mirtazapine     Other reaction(s): Unknown    Past Medical History:  Diagnosis Date   ADHD 01/15/2009   Qualifier: Diagnosis of  By: Madilyn Fireman MD, Catherine     Anxiety state, unspecified 01/15/2009   Qualifier: Diagnosis of  By: Madilyn Fireman MD, Catherine     Essential hypertension, benign 06/06/2008   Qualifier: Diagnosis of  By: Madilyn Fireman MD, Latanya Maudlin 08/12/2009   Qualifier: Diagnosis of  By: Madilyn Fireman MD, [place] W/UR OBST & OTH LUTS 01/15/2009   Qualifier: Diagnosis of  By: Madilyn Fireman MD, Lowella Petties 06/06/2008   Qualifier: Diagnosis of  By: Madilyn Fireman MD, [place]     KNEE PAIN 08/12/2009   Qualifier: Diagnosis of  By: Madilyn Fireman MD, Idelle Crouch History  Problem Relation Age of Onset   Cirrhosis Father    Diabetes Mellitus II Father    Gallbladder disease Father    Gallbladder disease Sister    Gallbladder disease Son     Social History   Socioeconomic History   Marital status: Single    Spouse name: Not on file   Number of children: Not on file   Years of education: Not on file   Highest education level: Not on file  Occupational History   Not on file  Tobacco Use   Smoking status: Every Day    Packs/day: 1.00    Years: 30.00    Pack years: 30.00    Types: Cigarettes   Smokeless tobacco: Never  Substance and Sexual Activity   Alcohol use: Not on file   Drug use: Not on file   Sexual activity: Not on file  Other Topics Concern   Not on file  Social History Narrative   Not on file   Social Determinants of Health   Financial Resource Strain: Not on file  Food Insecurity: Not on file  Transportation Needs: Not on file  Physical Activity: Not on file  Stress: Not on file  Social Connections: Not on file  Intimate Partner Violence: Not on file    Past Medical History, Surgical history, Social history, and Family history were reviewed and updated as appropriate.   Please see review of  systems for further details on the patient's review from today.   Objective:   Physical Exam:  There were no vitals taken for this visit.  Physical Exam Neurological:     Mental Status: He is alert and oriented to person, place, and time.     Cranial Nerves: No dysarthria.  Psychiatric:        Attention and Perception: Attention and perception normal.        Mood and Affect: Mood normal.        Speech: Speech normal.        Behavior: Behavior is cooperative.        Thought Content: Thought content normal. Thought content is not paranoid or delusional. Thought content does not include homicidal or suicidal ideation. Thought content does not include homicidal  or suicidal plan.        Cognition and Memory: Cognition and memory normal.        Judgment: Judgment normal.     Comments: Insight intact    Lab Review:     Component Value Date/Time   NA 140 03/10/2021 1039   NA 137 04/22/2018 0000   K 4.0 03/10/2021 1039   CL 102 03/10/2021 1039   CO2 31 03/10/2021 1039   GLUCOSE 161 (H) 03/10/2021 1039   BUN 19 03/10/2021 1039   BUN 12 04/22/2018 0000   CREATININE 1.32 (H) 03/10/2021 1039   CALCIUM 9.2 03/10/2021 1039   PROT 6.6 03/10/2021 1039   ALBUMIN 4.8 01/11/2017 1129   AST 29 03/10/2021 1039   ALT 27 03/10/2021 1039   ALKPHOS 224 (A) 04/22/2018 0000   BILITOT 0.9 03/10/2021 1039   GFRNONAA 81 03/18/2020 1003   GFRAA 94 03/18/2020 1003       Component Value Date/Time   WBC 7.9 03/10/2021 1039   RBC 4.75 03/10/2021 1039   HGB 14.2 03/10/2021 1039   HCT 42.8 03/10/2021 1039   PLT 196 03/10/2021 1039   MCV 90.1 03/10/2021 1039   MCH 29.9 03/10/2021 1039   MCHC 33.2 03/10/2021 1039   RDW 13.0 03/10/2021 1039    No results found for: POCLITH, LITHIUM   No results found for: PHENYTOIN, PHENOBARB, VALPROATE, CBMZ   .res Assessment: Plan:    Plan:  1. Adderall 72m TID 2. Seroquel 105m- 1 to 2 at hs  RTC 3 months   Time spent with patient was 20 minutes.  Greater than 50% of face to face time with patient was spent on counseling and coordination of care.   Discussed potential metabolic side effects associated with atypical antipsychotics, as well as potential risk for movement side effects. Advised pt to contact office if movement side effects occur.   Discussed potential benefits, risks, and side effects of stimulants with patient to include increased heart rate, palpitations, insomnia, increased anxiety, increased irritability, or decreased appetite.  Instructed patient to contact office if experiencing any significant tolerability issues.  Diagnoses and all orders for this visit:  Insomnia, unspecified type  Attention deficit hyperactivity disorder (ADHD), unspecified ADHD type -     amphetamine-dextroamphetamine (ADDERALL) 20 MG tablet; Take one tablet three times daily. -     amphetamine-dextroamphetamine (ADDERALL) 20 MG tablet; Take one tablet three times daily. -     amphetamine-dextroamphetamine (ADDERALL) 20 MG tablet; Take one tablet three times a day.  PTSD (post-traumatic stress disorder)   Please see After Visit Summary for patient specific instructions.  Future Appointments  Date Time Provider [place]12/01/2021  9:50 AM MeHali MarryMD [facility] None    No orders of the defined types were placed in this encounter.     -------------------------------

## 2021-03-20 ENCOUNTER — Other Ambulatory Visit: Payer: Self-pay

## 2021-03-20 ENCOUNTER — Telehealth: Payer: Self-pay | Admitting: Adult Health

## 2021-03-20 DIAGNOSIS — F331 Major depressive disorder, recurrent, moderate: Secondary | ICD-10-CM

## 2021-03-20 DIAGNOSIS — G4700 Insomnia, unspecified: Secondary | ICD-10-CM

## 2021-03-20 MED ORDER — QUETIAPINE FUMARATE 100 MG PO TABS: ORAL_TABLET | ORAL | 0 refills | Status: DC

## 2021-03-20 NOTE — Telephone Encounter (Signed)
Pt called in with refill for Seroquel 100mg . States that after appt yesterday that he asked to hold off on on sending prescription until he found a pharmacy. Pharmacy [facility] [address] [place].

## 2021-03-20 NOTE — Telephone Encounter (Signed)
Rx sent 

## 2021-06-20 ENCOUNTER — Other Ambulatory Visit: Payer: Self-pay

## 2021-06-20 ENCOUNTER — Telehealth: Payer: Self-pay | Admitting: Adult Health

## 2021-06-20 DIAGNOSIS — F331 Major depressive disorder, recurrent, moderate: Secondary | ICD-10-CM

## 2021-06-20 DIAGNOSIS — G4700 Insomnia, unspecified: Secondary | ICD-10-CM

## 2021-06-20 MED ORDER — QUETIAPINE FUMARATE 100 MG PO TABS: ORAL_TABLET | ORAL | 0 refills | Status: DC

## 2021-06-20 NOTE — Telephone Encounter (Signed)
Please review and send 

## 2021-06-20 NOTE — Telephone Encounter (Signed)
Pt called and needs a refill on his seroquel 100 mg. It needs to be sent to the [facility] on [address] in [place] Next appt is on Monday 06/20/21

## 2021-06-23 ENCOUNTER — Telehealth: Payer: Self-pay | Admitting: *Deleted

## 2021-06-23 ENCOUNTER — Telehealth: Payer: Self-pay | Admitting: Adult Health

## 2021-06-23 ENCOUNTER — Ambulatory Visit (INDEPENDENT_AMBULATORY_CARE_PROVIDER_SITE_OTHER): Payer: BLUE CROSS/BLUE SHIELD | Admitting: Adult Health

## 2021-06-23 ENCOUNTER — Encounter: Payer: Self-pay | Admitting: Adult Health

## 2021-06-23 DIAGNOSIS — F331 Major depressive disorder, recurrent, moderate: Secondary | ICD-10-CM

## 2021-06-23 DIAGNOSIS — F411 Generalized anxiety disorder: Secondary | ICD-10-CM | POA: Diagnosis not present

## 2021-06-23 DIAGNOSIS — F909 Attention-deficit hyperactivity disorder, unspecified type: Secondary | ICD-10-CM

## 2021-06-23 DIAGNOSIS — F4310 Post-traumatic stress disorder, unspecified: Secondary | ICD-10-CM

## 2021-06-23 DIAGNOSIS — G4700 Insomnia, unspecified: Secondary | ICD-10-CM | POA: Diagnosis not present

## 2021-06-23 MED ORDER — QUETIAPINE FUMARATE 100 MG PO TABS
ORAL_TABLET | ORAL | 3 refills | Status: DC
Start: 2021-06-23 — End: 2022-03-05

## 2021-06-23 MED ORDER — AMPHETAMINE-DEXTROAMPHETAMINE 20 MG PO TABS: ORAL_TABLET | ORAL | 0 refills | Status: DC

## 2021-06-23 NOTE — Telephone Encounter (Signed)
Pt reports that he has been urinating a lot for the past few weeks. He reports that he has to go about every 2 hours.   No pain,with urination, no back,flank,abdominal pain. No n/v/d/f/s/c.   Appt scheduled for tomorrow.

## 2021-06-23 NOTE — Telephone Encounter (Signed)
Mailed patient Rx to home address. As requested.

## 2021-06-23 NOTE — Progress Notes (Signed)
Gary Stevens [id number] 1966-01-22 55 y.o.  Virtual Visit via Telephone Note  I connected with pt on 06/23/21 at  9:00 AM EDT by telephone and verified that I am speaking with the correct person using two identifiers.   I discussed the limitations, risks, security and privacy concerns of performing an evaluation and management service by telephone and the availability of in person appointments. I also discussed with the patient that there may be a patient responsible charge related to this service. The patient expressed understanding and agreed to proceed.   I discussed the assessment and treatment plan with the patient. The patient was provided an opportunity to ask questions and all were answered. The patient agreed with the plan and demonstrated an understanding of the instructions.   The patient was advised to call back or seek an in-person evaluation if the symptoms worsen or if the condition fails to improve as anticipated.  I provided 25 minutes of non-face-to-face time during this encounter.  The patient was located at home.  The provider was located at [place].   Aloha Gell, NP   Subjective:   Patient ID:  Gary Stevens is a 55 y.o. (DOB [birth year]) male.  Chief Complaint: No chief complaint on file.   HPI Gary Stevens presents for follow-up of ADHD, insomnia and PTSD.  Describes mood today as "ok". Pleasant. Mood symptoms - denies depression, anxiety, and irritability. Stating "mentally I'm fine". Feels like medications continue to work well for him. He and wife doing well. Works full time as a Administrator. Planning  Working on weight loss. Stable interest and motivation. Taking medications as prescribed. Energy levels stable. Active, does not have a regular exercise routine.  Enjoys some usual interests and activities. Married. Lives with wife. Mother and 3 sisters local.   Appetite adequate. Weight gain - it fluctuates. Sleeps better some  nights than others. Averages 6 to 8 hours with Seroquel.  Focus and concentration stable with Adderall. Completing tasks. Managing aspects of household. 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  Denies SI or HI.  Denies AH or VH.   Review of Systems:  Review of Systems  Musculoskeletal:  Negative for gait problem.  Neurological:  Negative for tremors.  Psychiatric/Behavioral:         Please refer to HPI   Medications: I have reviewed the patient's current medications.  Current Outpatient Medications  Medication Sig Dispense Refill   amphetamine-dextroamphetamine (ADDERALL) 20 MG tablet Take 20 mg by mouth 3 (three) times daily.  0   amphetamine-dextroamphetamine (ADDERALL) 20 MG tablet Take one tablet three times daily. 90 tablet 0   [START ON 07/21/2021] amphetamine-dextroamphetamine (ADDERALL) 20 MG tablet Take one tablet three times a day. 90 tablet 0   [START ON 08/18/2021] amphetamine-dextroamphetamine (ADDERALL) 20 MG tablet Take one tablet three times daily. 90 tablet 0   Blood Glucose Monitoring Suppl (ONE TOUCH ULTRA 2) w/Device KIT See admin instructions.  0   meloxicam (MOBIC) 15 MG tablet Take 1 tablet (15 mg total) by mouth daily. Take with food 15 tablet 0   metFORMIN (GLUCOPHAGE) 500 MG tablet Take 1 tablet (500 mg total) by mouth 2 (two) times daily with a meal. 180 tablet 1   metoprolol succinate (TOPROL-XL) 100 MG 24 hr tablet Take 1 tablet (100 mg total) by mouth daily. Take with or immediately following a meal. 90 tablet 1   ONE TOUCH ULTRA TEST test strip Check blood sugar 4 times daily 300  each prn   pantoprazole (PROTONIX) 20 MG tablet Take 1 tablet (20 mg total) by mouth daily. 90 tablet 3   QUEtiapine (SEROQUEL) 100 MG tablet Take 1-2 tablets by mouth at bedtime 180 tablet 3   valsartan-hydrochlorothiazide (DIOVAN-HCT) 320-25 MG tablet Take 1 tablet by mouth daily. 90 tablet 1   No current facility-administered medications for this visit.    Medication Side  Effects: None  Allergies:  Allergies  Allergen Reactions   Mirtazapine     Other reaction(s): Unknown    Past Medical History:  Diagnosis Date   ADHD 01/15/2009   Qualifier: Diagnosis of  By: Madilyn Fireman MD, Catherine     Anxiety state, unspecified 01/15/2009   Qualifier: Diagnosis of  By: Madilyn Fireman MD, Catherine     Essential hypertension, benign 06/06/2008   Qualifier: Diagnosis of  By: Madilyn Fireman MD, Latanya Maudlin 08/12/2009   Qualifier: Diagnosis of  By: Madilyn Fireman MD, [place] W/UR OBST & OTH LUTS 01/15/2009   Qualifier: Diagnosis of  By: Madilyn Fireman MD, Lowella Petties 06/06/2008   Qualifier: Diagnosis of  By: Madilyn Fireman MD, [place]     KNEE PAIN 08/12/2009   Qualifier: Diagnosis of  By: Madilyn Fireman MD, Idelle Crouch History  Problem Relation Age of Onset   Cirrhosis Father    Diabetes Mellitus II Father    Gallbladder disease Father    Gallbladder disease Sister    Gallbladder disease Son     Social History   Socioeconomic History   Marital status: Single    Spouse name: Not on file   Number of children: Not on file   Years of education: Not on file   Highest education level: Not on file  Occupational History   Not on file  Tobacco Use   Smoking status: Every Day    Packs/day: 1.00    Years: 30.00    Pack years: 30.00    Types: Cigarettes   Smokeless tobacco: Never  Substance and Sexual Activity   Alcohol use: Not on file   Drug use: Not on file   Sexual activity: Not on file  Other Topics Concern   Not on file  Social History Narrative   Not on file   Social Determinants of Health   Financial Resource Strain: Not on file  Food Insecurity: Not on file  Transportation Needs: Not on file  Physical Activity: Not on file  Stress: Not on file  Social Connections: Not on file  Intimate Partner Violence: Not on file    Past Medical History, Surgical history, Social history, and Family history were  reviewed and updated as appropriate.   Please see review of systems for further details on the patient's review from today.   Objective:   Physical Exam:  There were no vitals taken for this visit.  Physical Exam Constitutional:      General: He is not in acute distress. Musculoskeletal:        General: No deformity.  Neurological:     Mental Status: He is alert and oriented to person, place, and time.     Coordination: Coordination normal.  Psychiatric:        Attention and Perception: Attention and perception normal. He does not perceive auditory or visual hallucinations.        Mood and Affect: Mood normal. Mood is not anxious or depressed. Affect is not labile, blunt, angry or inappropriate.  Speech: Speech normal.        Behavior: Behavior normal.        Thought Content: Thought content normal. Thought content is not paranoid or delusional. Thought content does not include homicidal or suicidal ideation. Thought content does not include homicidal or suicidal plan.        Cognition and Memory: Cognition and memory normal.        Judgment: Judgment normal.     Comments: Insight intact    Lab Review:     Component Value Date/Time   NA 140 03/10/2021 1039   NA 137 04/22/2018 0000   K 4.0 03/10/2021 1039   CL 102 03/10/2021 1039   CO2 31 03/10/2021 1039   GLUCOSE 161 (H) 03/10/2021 1039   BUN 19 03/10/2021 1039   BUN 12 04/22/2018 0000   CREATININE 1.32 (H) 03/10/2021 1039   CALCIUM 9.2 03/10/2021 1039   PROT 6.6 03/10/2021 1039   ALBUMIN 4.8 01/11/2017 1129   AST 29 03/10/2021 1039   ALT 27 03/10/2021 1039   ALKPHOS 224 (A) 04/22/2018 0000   BILITOT 0.9 03/10/2021 1039   GFRNONAA 81 03/18/2020 1003   GFRAA 94 03/18/2020 1003       Component Value Date/Time   WBC 7.9 03/10/2021 1039   RBC 4.75 03/10/2021 1039   HGB 14.2 03/10/2021 1039   HCT 42.8 03/10/2021 1039   PLT 196 03/10/2021 1039   MCV 90.1 03/10/2021 1039   MCH 29.9 03/10/2021 1039   MCHC  33.2 03/10/2021 1039   RDW 13.0 03/10/2021 1039    No results found for: POCLITH, LITHIUM   No results found for: PHENYTOIN, PHENOBARB, VALPROATE, CBMZ   .res Assessment: Plan:    Plan:  1. Adderall 49m TID 2. Seroquel 1022m- 1 to 2 at hs  RTC 3 months  Time spent with patient was 20 minutes. Greater than 50% of face to face time with patient was spent on counseling and coordination of care.   Discussed potential metabolic side effects associated with atypical antipsychotics, as well as potential risk for movement side effects. Advised pt to contact office if movement side effects occur.   Discussed potential benefits, risks, and side effects of stimulants with patient to include increased heart rate, palpitations, insomnia, increased anxiety, increased irritability, or decreased appetite.  Instructed patient to contact office if experiencing any significant tolerability issues. Diagnoses and all orders for this visit:  Attention deficit hyperactivity disorder (ADHD), unspecified ADHD type -     amphetamine-dextroamphetamine (ADDERALL) 20 MG tablet; Take one tablet three times daily. -     amphetamine-dextroamphetamine (ADDERALL) 20 MG tablet; Take one tablet three times a day. -     amphetamine-dextroamphetamine (ADDERALL) 20 MG tablet; Take one tablet three times daily.  Insomnia, unspecified type -     QUEtiapine (SEROQUEL) 100 MG tablet; Take 1-2 tablets by mouth at bedtime  PTSD (post-traumatic stress disorder)  Generalized anxiety disorder  Major depressive disorder, recurrent episode, moderate (HCC) -     QUEtiapine (SEROQUEL) 100 MG tablet; Take 1-2 tablets by mouth at bedtime   Please see After Visit Summary for patient specific instructions.  Future Appointments  Date Time Provider [place]12/01/2021  9:50 AM MeHali MarryMD [facility] None    No orders of the defined types were placed in this encounter.      -------------------------------

## 2021-06-24 ENCOUNTER — Encounter: Payer: Self-pay | Admitting: Family Medicine

## 2021-06-24 ENCOUNTER — Ambulatory Visit (INDEPENDENT_AMBULATORY_CARE_PROVIDER_SITE_OTHER): Payer: BLUE CROSS/BLUE SHIELD | Admitting: Family Medicine

## 2021-06-24 VITALS — BP 136/70 | HR 70 | Ht 75.0 in | Wt 276.0 lb

## 2021-06-24 DIAGNOSIS — E118 Type 2 diabetes mellitus with unspecified complications: Secondary | ICD-10-CM

## 2021-06-24 DIAGNOSIS — E119 Type 2 diabetes mellitus without complications: Secondary | ICD-10-CM

## 2021-06-24 DIAGNOSIS — R350 Frequency of micturition: Secondary | ICD-10-CM

## 2021-06-24 LAB — POCT URINALYSIS DIP (CLINITEK)
Bilirubin, UA: NEGATIVE
Blood, UA: NEGATIVE
Glucose, UA: 100 mg/dL — AB
Leukocytes, UA: NEGATIVE
Nitrite, UA: NEGATIVE
Spec Grav, UA: >=1.03 — AB (ref 1.010–1.025)
Urobilinogen, UA: 1 E.U./dL
pH, UA: 6 (ref 5.0–8.0)

## 2021-06-24 LAB — POCT GLYCOSYLATED HEMOGLOBIN (HGB A1C): Hemoglobin A1C: 8 % — AB (ref 4.0–5.6)

## 2021-06-24 MED ORDER — METFORMIN HCL 1000 MG PO TABS: 1000.0000 mg | ORAL_TABLET | Freq: Two times a day (BID) | ORAL | 1 refills | Status: DC

## 2021-06-24 NOTE — Progress Notes (Signed)
Pt reports that he has been urinating a lot for the past few weeks. He reports that he has to go about every 2 hours. He feels that it is due to being under a lot of stress   No pain,with urination, no back,flank,abdominal pain, no blood in urine/stool. Last BM was NL. No n/v/d/f/s/c.

## 2021-06-24 NOTE — Assessment & Plan Note (Signed)
A1c is uncontrolled it was previously 6.4 in July it is now up to 8.0.  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  For now we will go ahead and increase metformin to 1000 mg twice a day.  Follow-up in 3 months call sooner if any problems.

## 2021-06-24 NOTE — Progress Notes (Signed)
Acute Office Visit  Subjective:    Patient ID: Gary Stevens, male    DOB: 03-08-1966, 55 y.o.   MRN: [id number]  Chief Complaint  Patient presents with   Urinary Frequency     HPI Patient is in today for Frequent urination.  He says he has noticed it for about 2 weeks but he is also been under a lot of stress he has been working a lot of really long hours and he has a constantly changing schedule so sometimes he works in the day sometimes he works overnight and it is made it really difficult for him to eat very consistently.  He has been off the last couple of days and says he is actually noticed improvement in the frequency actually feels a Little better he has not experienced any fevers chills or sweats or dysuria.  He is still taking his metformin regularly.  No recent chest pain or shortness of breath.  No GI symptoms.  No unusual headaches.  Last hemoglobin A1c looked great at 6.4 in July.  Past Medical History:  Diagnosis Date   ADHD 01/15/2009   Qualifier: Diagnosis of  By: Madilyn Fireman MD, Rooney Gladwin     Anxiety state, unspecified 01/15/2009   Qualifier: Diagnosis of  By: Madilyn Fireman MD, Adabelle Griffiths     Essential hypertension, benign 06/06/2008   Qualifier: Diagnosis of  By: Madilyn Fireman MD, Latanya Maudlin 08/12/2009   Qualifier: Diagnosis of  By: Madilyn Fireman MD, Evi Mccomb     HYPERTROPHY PROSTATE W/UR OBST & OTH LUTS 01/15/2009   Qualifier: Diagnosis of  By: Madilyn Fireman MD, Lowella Petties 06/06/2008   Qualifier: Diagnosis of  By: Madilyn Fireman MD, Orine Goga     KNEE PAIN 08/12/2009   Qualifier: Diagnosis of  By: Madilyn Fireman MD, Barnetta Chapel      History reviewed. No pertinent surgical history.  Family History  Problem Relation Age of Onset   Cirrhosis Father    Diabetes Mellitus II Father    Gallbladder disease Father    Gallbladder disease Sister    Gallbladder disease Son     Social History   Socioeconomic History   Marital status: Single    Spouse name: Not  on file   Number of children: Not on file   Years of education: Not on file   Highest education level: Not on file  Occupational History   Not on file  Tobacco Use   Smoking status: Every Day    Packs/day: 1.00    Years: 30.00    Pack years: 30.00    Types: Cigarettes   Smokeless tobacco: Never  Substance and Sexual Activity   Alcohol use: Not on file   Drug use: Not on file   Sexual activity: Not on file  Other Topics Concern   Not on file  Social History Narrative   Not on file   Social Determinants of Health   Financial Resource Strain: Not on file  Food Insecurity: Not on file  Transportation Needs: Not on file  Physical Activity: Not on file  Stress: Not on file  Social Connections: Not on file  Intimate Partner Violence: Not on file    Outpatient Medications Prior to Visit  Medication Sig Dispense Refill   amphetamine-dextroamphetamine (ADDERALL) 20 MG tablet Take 20 mg by mouth 3 (three) times daily.  0   amphetamine-dextroamphetamine (ADDERALL) 20 MG tablet Take one tablet three times daily. 90 tablet 0   [START ON 07/21/2021] amphetamine-dextroamphetamine (ADDERALL) 20 MG  tablet Take one tablet three times a day. 90 tablet 0   [START ON 08/18/2021] amphetamine-dextroamphetamine (ADDERALL) 20 MG tablet Take one tablet three times daily. 90 tablet 0   Blood Glucose Monitoring Suppl (ONE TOUCH ULTRA 2) w/Device KIT See admin instructions.  0   meloxicam (MOBIC) 15 MG tablet Take 1 tablet (15 mg total) by mouth daily. Take with food 15 tablet 0   metoprolol succinate (TOPROL-XL) 100 MG 24 hr tablet Take 1 tablet (100 mg total) by mouth daily. Take with or immediately following a meal. 90 tablet 1   ONE TOUCH ULTRA TEST test strip Check blood sugar 4 times daily 300 each prn   pantoprazole (PROTONIX) 20 MG tablet Take 1 tablet (20 mg total) by mouth daily. 90 tablet 3   QUEtiapine (SEROQUEL) 100 MG tablet Take 1-2 tablets by mouth at bedtime 180 tablet 3    valsartan-hydrochlorothiazide (DIOVAN-HCT) 320-25 MG tablet Take 1 tablet by mouth daily. 90 tablet 1   metFORMIN (GLUCOPHAGE) 500 MG tablet Take 1 tablet (500 mg total) by mouth 2 (two) times daily with a meal. 180 tablet 1   No facility-administered medications prior to visit.    Allergies  Allergen Reactions   Mirtazapine     Other reaction(s): Unknown    Review of Systems     Objective:    Physical Exam Constitutional:      Appearance: He is well-developed.  HENT:     Head: Normocephalic and atraumatic.  Cardiovascular:     Rate and Rhythm: Normal rate and regular rhythm.     Heart sounds: Normal heart sounds.  Pulmonary:     Effort: Pulmonary effort is normal.     Breath sounds: Normal breath sounds.  Skin:    General: Skin is warm and dry.  Neurological:     Mental Status: He is alert and oriented to person, place, and time.  Psychiatric:        Behavior: Behavior normal.    BP 136/70   Pulse 70   Ht $R'6\' 3"'Nm$  (1.905 m)   Wt 276 lb (125.2 kg)   SpO2 95%   BMI 34.50 kg/m  Wt Readings from Last 3 Encounters:  06/24/21 276 lb (125.2 kg)  03/05/21 276 lb (125.2 kg)  10/30/20 260 lb (117.9 kg)    Health Maintenance Due  Topic Date Due   Zoster Vaccines- Shingrix (1 of 2) Never done    There are no preventive care reminders to display for this patient.   Lab Results  Component Value Date   TSH 3.440 01/15/2009   Lab Results  Component Value Date   WBC 7.9 03/10/2021   HGB 14.2 03/10/2021   HCT 42.8 03/10/2021   MCV 90.1 03/10/2021   PLT 196 03/10/2021   Lab Results  Component Value Date   NA 140 03/10/2021   K 4.0 03/10/2021   CO2 31 03/10/2021   GLUCOSE 161 (H) 03/10/2021   BUN 19 03/10/2021   CREATININE 1.32 (H) 03/10/2021   BILITOT 0.9 03/10/2021   ALKPHOS 224 (A) 04/22/2018   AST 29 03/10/2021   ALT 27 03/10/2021   PROT 6.6 03/10/2021   ALBUMIN 4.8 01/11/2017   CALCIUM 9.2 03/10/2021   EGFR 64 03/10/2021   Lab Results  Component  Value Date   CHOL 177 03/10/2021   Lab Results  Component Value Date   HDL 42 03/10/2021   Lab Results  Component Value Date   LDLCALC 93 03/10/2021   Lab Results  Component Value Date   TRIG 317 (H) 03/10/2021   Lab Results  Component Value Date   CHOLHDL 4.2 03/10/2021   Lab Results  Component Value Date   HGBA1C 8.0 (A) 06/24/2021       Assessment & Plan:   Problem List Items Addressed This Visit       Endocrine   Controlled type 2 diabetes mellitus without complication, without long-term current use of insulin (HCC)    A1c is uncontrolled it was previously 6.4 in July it is now up to 8.0.  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  For now we will go ahead and increase metformin to 1000 mg twice a day.  Follow-up in 3 months call sooner if any problems.      Relevant Medications   metFORMIN (GLUCOPHAGE) 1000 MG tablet   Other Visit Diagnoses     Urinary frequency    -  Primary   Relevant Orders   POCT glycosylated hemoglobin (Hb A1C) (Completed)   POCT URINALYSIS DIP (CLINITEK) (Completed)   Controlled type 2 diabetes mellitus with complication, without long-term current use of insulin (HCC)       Relevant Medications   metFORMIN (GLUCOPHAGE) 1000 MG tablet   Other Relevant Orders   POCT glycosylated hemoglobin (Hb A1C) (Completed)   POCT URINALYSIS DIP (CLINITEK) (Completed)      Urinary frequency I suspect is elevated secondary to elevated glucose levels.  He is spilling glucose into his urine but no sign of infection.  Will adjust his diabetes regimen.    Meds ordered this encounter  Medications   metFORMIN (GLUCOPHAGE) 1000 MG tablet    Sig: Take 1 tablet (1,000 mg total) by mouth 2 (two) times daily with a meal.    Dispense:  180 tablet    Refill:  1       Beatrice Lecher, MD

## 2021-08-05 ENCOUNTER — Ambulatory Visit: Payer: BLUE CROSS/BLUE SHIELD | Admitting: Family Medicine

## 2021-09-02 ENCOUNTER — Other Ambulatory Visit: Payer: Self-pay | Admitting: *Deleted

## 2021-09-02 DIAGNOSIS — I10 Essential (primary) hypertension: Secondary | ICD-10-CM

## 2021-09-02 MED ORDER — METOPROLOL SUCCINATE ER 100 MG PO TB24: 100.0000 mg | ORAL_TABLET | Freq: Every day | ORAL | 1 refills | Status: DC

## 2021-09-02 MED ORDER — VALSARTAN-HYDROCHLOROTHIAZIDE 320-25 MG PO TABS: 1.0000 | ORAL_TABLET | Freq: Every day | ORAL | 1 refills | Status: DC

## 2021-09-22 ENCOUNTER — Ambulatory Visit: Payer: BLUE CROSS/BLUE SHIELD | Admitting: Adult Health

## 2021-09-22 ENCOUNTER — Encounter: Payer: Self-pay | Admitting: Adult Health

## 2021-09-22 DIAGNOSIS — F4310 Post-traumatic stress disorder, unspecified: Secondary | ICD-10-CM | POA: Diagnosis not present

## 2021-09-22 DIAGNOSIS — G4700 Insomnia, unspecified: Secondary | ICD-10-CM | POA: Diagnosis not present

## 2021-09-22 DIAGNOSIS — F909 Attention-deficit hyperactivity disorder, unspecified type: Secondary | ICD-10-CM

## 2021-09-22 MED ORDER — AMPHETAMINE-DEXTROAMPHETAMINE 20 MG PO TABS: ORAL_TABLET | ORAL | 0 refills | Status: DC

## 2021-09-22 MED ORDER — AMPHETAMINE-DEXTROAMPHETAMINE 20 MG PO TABS: 20.0000 mg | ORAL_TABLET | Freq: Three times a day (TID) | ORAL | 0 refills | Status: DC

## 2021-09-22 NOTE — Progress Notes (Signed)
Gary Stevens [id number] 1965-11-05 56 y.o.  Virtual Visit via Telephone Note  I connected with pt on 09/22/21 at 10:20 AM EST by telephone and verified that I am speaking with the correct person using two identifiers.   I discussed the limitations, risks, security and privacy concerns of performing an evaluation and management service by telephone and the availability of in person appointments. I also discussed with the patient that there may be a patient responsible charge related to this service. The patient expressed understanding and agreed to proceed.   I discussed the assessment and treatment plan with the patient. The patient was provided an opportunity to ask questions and all were answered. The patient agreed with the plan and demonstrated an understanding of the instructions.   The patient was advised to call back or seek an in-person evaluation if the symptoms worsen or if the condition fails to improve as anticipated.  I provided 20 minutes of non-face-to-face time during this encounter.  The patient was located at home.  The provider was located at [place].   Aloha Gell, NP   Subjective:   Patient ID:  Gary Stevens is a 56 y.o. (DOB [birth year]) male.  Chief Complaint: No chief complaint on file.   HPI Gary Stevens presents for follow-up of ADHD, insomnia and PTSD.  Describes mood today as "ok". Pleasant. Denies tearfulness. Mood symptoms - denies depression, anxiety, and irritability. Stating "I'm doing well". Feels like medications continue to work well for him. He and wife doing well. Saw mother over the holidays. 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 Stable interest and motivation. Taking medications as prescribed. Energy levels stable. Active, does not have a regular exercise routine.  Enjoys some usual interests and activities. Married. Lives with wife. Mother and 3 sisters local.   Appetite  adequate. Weight loss - 15 pounds over the past year. Sleeps better some nights than others. Averages 6 to 8 hours with Seroquel.  Focus and concentration stable with Adderall. Completing tasks. Managing aspects of household. 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  Denies SI or HI.  Denies AH or VH.   Review of Systems:  Review of Systems  Musculoskeletal:  Negative for gait problem.  Neurological:  Negative for tremors.  Psychiatric/Behavioral:         Please refer to HPI   Medications: I have reviewed the patient's current medications.  Current Outpatient Medications  Medication Sig Dispense Refill   amphetamine-dextroamphetamine (ADDERALL) 20 MG tablet Take 20 mg by mouth 3 (three) times daily.  0   amphetamine-dextroamphetamine (ADDERALL) 20 MG tablet Take one tablet three times daily. 90 tablet 0   amphetamine-dextroamphetamine (ADDERALL) 20 MG tablet Take one tablet three times a day. 90 tablet 0   amphetamine-dextroamphetamine (ADDERALL) 20 MG tablet Take one tablet three times daily. 90 tablet 0   Blood Glucose Monitoring Suppl (ONE TOUCH ULTRA 2) w/Device KIT See admin instructions.  0   meloxicam (MOBIC) 15 MG tablet Take 1 tablet (15 mg total) by mouth daily. Take with food 15 tablet 0   metFORMIN (GLUCOPHAGE) 1000 MG tablet Take 1 tablet (1,000 mg total) by mouth 2 (two) times daily with a meal. 180 tablet 1   metoprolol succinate (TOPROL-XL) 100 MG 24 hr tablet Take 1 tablet (100 mg total) by mouth daily. Take with or immediately following a meal. 90 tablet 1   ONE TOUCH ULTRA TEST test strip  Check blood sugar 4 times daily 300 each prn   pantoprazole (PROTONIX) 20 MG tablet Take 1 tablet (20 mg total) by mouth daily. 90 tablet 3   QUEtiapine (SEROQUEL) 100 MG tablet Take 1-2 tablets by mouth at bedtime 180 tablet 3   valsartan-hydrochlorothiazide (DIOVAN-HCT) 320-25 MG tablet Take 1 tablet by mouth daily. 90 tablet 1   No current facility-administered medications for this  visit.    Medication Side Effects: None  Allergies:  Allergies  Allergen Reactions   Mirtazapine     Other reaction(s): Unknown    Past Medical History:  Diagnosis Date   ADHD 01/15/2009   Qualifier: Diagnosis of  By: Madilyn Fireman MD, Catherine     Anxiety state, unspecified 01/15/2009   Qualifier: Diagnosis of  By: Madilyn Fireman MD, Catherine     Essential hypertension, benign 06/06/2008   Qualifier: Diagnosis of  By: Madilyn Fireman MD, Latanya Maudlin 08/12/2009   Qualifier: Diagnosis of  By: Madilyn Fireman MD, Anna Maria W/UR OBST & OTH LUTS 01/15/2009   Qualifier: Diagnosis of  By: Madilyn Fireman MD, Lowella Petties 06/06/2008   Qualifier: Diagnosis of  By: Madilyn Fireman MD, Emory     KNEE PAIN 08/12/2009   Qualifier: Diagnosis of  By: Madilyn Fireman MD, Idelle Crouch History  Problem Relation Age of Onset   Cirrhosis Father    Diabetes Mellitus II Father    Gallbladder disease Father    Gallbladder disease Sister    Gallbladder disease Son     Social History   Socioeconomic History   Marital status: Single    Spouse name: Not on file   Number of children: Not on file   Years of education: Not on file   Highest education level: Not on file  Occupational History   Not on file  Tobacco Use   Smoking status: Every Day    Packs/day: 1.00    Years: 30.00    Pack years: 30.00    Types: Cigarettes   Smokeless tobacco: Never  Substance and Sexual Activity   Alcohol use: Not on file   Drug use: Not on file   Sexual activity: Not on file  Other Topics Concern   Not on file  Social History Narrative   Not on file   Social Determinants of Health   Financial Resource Strain: Not on file  Food Insecurity: Not on file  Transportation Needs: Not on file  Physical Activity: Not on file  Stress: Not on file  Social Connections: Not on file  Intimate Partner Violence: Not on file    Past Medical History, Surgical history, Social history,  and Family history were reviewed and updated as appropriate.   Please see review of systems for further details on the patient's review from today.   Objective:   Physical Exam:  There were no vitals taken for this visit.  Physical Exam Constitutional:      General: He is not in acute distress. Musculoskeletal:        General: No deformity.  Neurological:     Mental Status: He is alert and oriented to person, place, and time.     Coordination: Coordination normal.  Psychiatric:        Attention and Perception: Attention and perception normal. He does not perceive auditory or visual hallucinations.        Mood and Affect: Mood normal. Mood is not anxious or depressed. Affect is not  labile, blunt, angry or inappropriate.        Speech: Speech normal.        Behavior: Behavior normal.        Thought Content: Thought content normal. Thought content is not paranoid or delusional. Thought content does not include homicidal or suicidal ideation. Thought content does not include homicidal or suicidal plan.        Cognition and Memory: Cognition and memory normal.        Judgment: Judgment normal.     Comments: Insight intact    Lab Review:     Component Value Date/Time   NA 140 03/10/2021 1039   NA 137 04/22/2018 0000   K 4.0 03/10/2021 1039   CL 102 03/10/2021 1039   CO2 31 03/10/2021 1039   GLUCOSE 161 (H) 03/10/2021 1039   BUN 19 03/10/2021 1039   BUN 12 04/22/2018 0000   CREATININE 1.32 (H) 03/10/2021 1039   CALCIUM 9.2 03/10/2021 1039   PROT 6.6 03/10/2021 1039   ALBUMIN 4.8 01/11/2017 1129   AST 29 03/10/2021 1039   ALT 27 03/10/2021 1039   ALKPHOS 224 (A) 04/22/2018 0000   BILITOT 0.9 03/10/2021 1039   GFRNONAA 81 03/18/2020 1003   GFRAA 94 03/18/2020 1003       Component Value Date/Time   WBC 7.9 03/10/2021 1039   RBC 4.75 03/10/2021 1039   HGB 14.2 03/10/2021 1039   HCT 42.8 03/10/2021 1039   PLT 196 03/10/2021 1039   MCV 90.1 03/10/2021 1039   MCH 29.9  03/10/2021 1039   MCHC 33.2 03/10/2021 1039   RDW 13.0 03/10/2021 1039    No results found for: POCLITH, LITHIUM   No results found for: PHENYTOIN, PHENOBARB, VALPROATE, CBMZ   .res Assessment: Plan:     Plan:  1. Adderall 12m TID - scripts printed - mailed script. 2. Seroquel 1026m- 1 to 2 at hs  RTC 3 months  Time spent with patient was 20 minutes. Greater than 50% of face to face time with patient was spent on counseling and coordination of care.   Discussed potential metabolic side effects associated with atypical antipsychotics, as well as potential risk for movement side effects. Advised pt to contact office if movement side effects occur.   Discussed potential benefits, risks, and side effects of stimulants with patient to include increased heart rate, palpitations, insomnia, increased anxiety, increased irritability, or decreased appetite.  Instructed patient to contact office if experiencing any significant tolerability issues.  There are no diagnoses linked to this encounter.  Please see After Visit Summary for patient specific instructions.  Future Appointments  Date Time Provider [place]1/26/2023  3:00 PM MeHali MarryMD [facility] None    No orders of the defined types were placed in this encounter.     -------------------------------

## 2021-09-25 ENCOUNTER — Ambulatory Visit: Payer: BLUE CROSS/BLUE SHIELD | Admitting: Family Medicine

## 2021-10-03 ENCOUNTER — Other Ambulatory Visit: Payer: Self-pay

## 2021-10-03 DIAGNOSIS — F909 Attention-deficit hyperactivity disorder, unspecified type: Secondary | ICD-10-CM

## 2021-10-03 MED ORDER — AMPHETAMINE-DEXTROAMPHETAMINE 20 MG PO TABS: 20.0000 mg | ORAL_TABLET | Freq: Three times a day (TID) | ORAL | 0 refills | Status: DC

## 2021-10-03 NOTE — Telephone Encounter (Signed)
Patient notified of refill.

## 2021-10-31 ENCOUNTER — Ambulatory Visit: Payer: BLUE CROSS/BLUE SHIELD | Admitting: Family Medicine

## 2021-10-31 ENCOUNTER — Encounter: Payer: Self-pay | Admitting: Family Medicine

## 2021-10-31 ENCOUNTER — Other Ambulatory Visit: Payer: Self-pay

## 2021-10-31 VITALS — BP 138/84 | HR 69 | Ht 75.0 in | Wt 275.0 lb

## 2021-10-31 DIAGNOSIS — E118 Type 2 diabetes mellitus with unspecified complications: Secondary | ICD-10-CM | POA: Diagnosis not present

## 2021-10-31 DIAGNOSIS — E119 Type 2 diabetes mellitus without complications: Secondary | ICD-10-CM | POA: Diagnosis not present

## 2021-10-31 DIAGNOSIS — I10 Essential (primary) hypertension: Secondary | ICD-10-CM | POA: Diagnosis not present

## 2021-10-31 DIAGNOSIS — F909 Attention-deficit hyperactivity disorder, unspecified type: Secondary | ICD-10-CM | POA: Diagnosis not present

## 2021-10-31 LAB — POCT GLYCOSYLATED HEMOGLOBIN (HGB A1C): Hemoglobin A1C: 6.4 % — AB (ref 4.0–5.6)

## 2021-10-31 MED ORDER — METOPROLOL SUCCINATE ER 100 MG PO TB24: 100.0000 mg | ORAL_TABLET | Freq: Every day | ORAL | 3 refills | Status: DC

## 2021-10-31 MED ORDER — METFORMIN HCL 1000 MG PO TABS: 1000.0000 mg | ORAL_TABLET | Freq: Two times a day (BID) | ORAL | 3 refills | Status: DC

## 2021-10-31 MED ORDER — VALSARTAN-HYDROCHLOROTHIAZIDE 320-25 MG PO TABS: 1.0000 | ORAL_TABLET | Freq: Every day | ORAL | 3 refills | Status: DC

## 2021-10-31 NOTE — Assessment & Plan Note (Signed)
A1C looks fantastic today at 6.4.  He is really done a great job bringing that down.  Continue with metformin.  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 ?

## 2021-10-31 NOTE — Assessment & Plan Note (Signed)
Repeat blood pressure looks much much better.  We will continue with current regimen.  Refill sent to pharmacy today.  Due for BMP to check renal and potassium since he is on an ARB and diuretic. ?

## 2021-10-31 NOTE — Assessment & Plan Note (Signed)
Followed by Dr. Kallie Locks. ?

## 2021-10-31 NOTE — Progress Notes (Signed)
Established Patient Office Visit  Subjective:  Patient ID: Gary Stevens, male    DOB: 1966/01/13  Age: 56 y.o. MRN: [id number]  CC:  Chief Complaint  Patient presents with   Diabetes   Hypertension    HPI Gary Stevens presents for   Hypertension- Pt denies chest pain, SOB, dizziness, or heart palpitations.  Taking meds as directed w/o problems.  Denies medication side effects.    Diabetes - no hypoglycemic events. No wounds or sores that are not healing well. No increased thirst or urination. Checking glucose at home. Taking medications as prescribed without any side effects.   Past Medical History:  Diagnosis Date   ADHD 01/15/2009   Qualifier: Diagnosis of  By: Madilyn Fireman MD, Mayank Teuscher     Anxiety state, unspecified 01/15/2009   Qualifier: Diagnosis of  By: Madilyn Fireman MD, Lamiya Naas     Essential hypertension, benign 06/06/2008   Qualifier: Diagnosis of  By: Madilyn Fireman MD, Latanya Maudlin 08/12/2009   Qualifier: Diagnosis of  By: Madilyn Fireman MD, Baillie Mohammad     HYPERTROPHY PROSTATE W/UR OBST & OTH LUTS 01/15/2009   Qualifier: Diagnosis of  By: Madilyn Fireman MD, Lowella Petties 06/06/2008   Qualifier: Diagnosis of  By: Madilyn Fireman MD, Lecil Tapp     KNEE PAIN 08/12/2009   Qualifier: Diagnosis of  By: Madilyn Fireman MD, Barnetta Chapel      History reviewed. No pertinent surgical history.  Family History  Problem Relation Age of Onset   Cirrhosis Father    Diabetes Mellitus II Father    Gallbladder disease Father    Gallbladder disease Sister    Gallbladder disease Son     Social History   Socioeconomic History   Marital status: Single    Spouse name: Not on file   Number of children: Not on file   Years of education: Not on file   Highest education level: Not on file  Occupational History   Not on file  Tobacco Use   Smoking status: Every Day    Packs/day: 1.00    Years: 30.00    Pack years: 30.00    Types: Cigarettes   Smokeless tobacco: Never   Substance and Sexual Activity   Alcohol use: Not on file   Drug use: Not on file   Sexual activity: Not on file  Other Topics Concern   Not on file  Social History Narrative   Not on file   Social Determinants of Health   Financial Resource Strain: Not on file  Food Insecurity: Not on file  Transportation Needs: Not on file  Physical Activity: Not on file  Stress: Not on file  Social Connections: Not on file  Intimate Partner Violence: Not on file    Outpatient Medications Prior to Visit  Medication Sig Dispense Refill   amphetamine-dextroamphetamine (ADDERALL) 20 MG tablet Take one tablet three times a day. 90 tablet 0   amphetamine-dextroamphetamine (ADDERALL) 20 MG tablet Take one tablet three times daily. 90 tablet 0   [START ON 11/17/2021] amphetamine-dextroamphetamine (ADDERALL) 20 MG tablet Take one tablet three times daily. 90 tablet 0   amphetamine-dextroamphetamine (ADDERALL) 20 MG tablet Take 1 tablet (20 mg total) by mouth 3 (three) times daily. 90 tablet 0   Blood Glucose Monitoring Suppl (ONE TOUCH ULTRA 2) w/Device KIT See admin instructions.  0   meloxicam (MOBIC) 15 MG tablet Take 1 tablet (15 mg total) by mouth daily. Take with food 15 tablet 0   ONE TOUCH  ULTRA TEST test strip Check blood sugar 4 times daily 300 each prn   pantoprazole (PROTONIX) 20 MG tablet Take 1 tablet (20 mg total) by mouth daily. 90 tablet 3   QUEtiapine (SEROQUEL) 100 MG tablet Take 1-2 tablets by mouth at bedtime 180 tablet 3   metFORMIN (GLUCOPHAGE) 1000 MG tablet Take 1 tablet (1,000 mg total) by mouth 2 (two) times daily with a meal. 180 tablet 1   metoprolol succinate (TOPROL-XL) 100 MG 24 hr tablet Take 1 tablet (100 mg total) by mouth daily. Take with or immediately following a meal. 90 tablet 1   valsartan-hydrochlorothiazide (DIOVAN-HCT) 320-25 MG tablet Take 1 tablet by mouth daily. 90 tablet 1   No facility-administered medications prior to visit.    Allergies  Allergen  Reactions   Mirtazapine     Other reaction(s): Unknown    ROS Review of Systems    Objective:    Physical Exam Constitutional:      Appearance: Normal appearance. He is well-developed.  HENT:     Head: Normocephalic and atraumatic.  Cardiovascular:     Rate and Rhythm: Normal rate and regular rhythm.     Heart sounds: Normal heart sounds.  Pulmonary:     Effort: Pulmonary effort is normal.     Breath sounds: Normal breath sounds.  Skin:    General: Skin is warm and dry.  Neurological:     Mental Status: He is alert and oriented to person, place, and time. Mental status is at baseline.  Psychiatric:        Behavior: Behavior normal.    BP 138/84    Pulse 69    Ht '6\' 3"'  (1.905 m)    Wt 275 lb (124.7 kg)    SpO2 96%    BMI 34.37 kg/m  Wt Readings from Last 3 Encounters:  10/31/21 275 lb (124.7 kg)  06/24/21 276 lb (125.2 kg)  03/05/21 276 lb (125.2 kg)     Health Maintenance Due  Topic Date Due   COVID-19 Vaccine (1) Never done   Hepatitis C Screening  Never done   COLONOSCOPY (Pts 45-48yr Insurance coverage will need to be confirmed)  Never done   Zoster Vaccines- Shingrix (1 of 2) Never done    There are no preventive care reminders to display for this patient.  Lab Results  Component Value Date   TSH 3.440 01/15/2009   Lab Results  Component Value Date   WBC 7.9 03/10/2021   HGB 14.2 03/10/2021   HCT 42.8 03/10/2021   MCV 90.1 03/10/2021   PLT 196 03/10/2021   Lab Results  Component Value Date   NA 140 03/10/2021   K 4.0 03/10/2021   CO2 31 03/10/2021   GLUCOSE 161 (H) 03/10/2021   BUN 19 03/10/2021   CREATININE 1.32 (H) 03/10/2021   BILITOT 0.9 03/10/2021   ALKPHOS 224 (A) 04/22/2018   AST 29 03/10/2021   ALT 27 03/10/2021   PROT 6.6 03/10/2021   ALBUMIN 4.8 01/11/2017   CALCIUM 9.2 03/10/2021   EGFR 64 03/10/2021   Lab Results  Component Value Date   CHOL 177 03/10/2021   Lab Results  Component Value Date   HDL 42 03/10/2021    Lab Results  Component Value Date   LDLCALC 93 03/10/2021   Lab Results  Component Value Date   TRIG 317 (H) 03/10/2021   Lab Results  Component Value Date   CHOLHDL 4.2 03/10/2021   Lab Results  Component Value Date  HGBA1C 6.4 (A) 10/31/2021      Assessment & Plan:   Problem List Items Addressed This Visit       Cardiovascular and Mediastinum   Essential hypertension - Primary    Repeat blood pressure looks much much better.  We will continue with current regimen.  Refill sent to pharmacy today.  Due for BMP to check renal and potassium since he is on an ARB and diuretic.      Relevant Medications   metoprolol succinate (TOPROL-XL) 100 MG 24 hr tablet   valsartan-hydrochlorothiazide (DIOVAN-HCT) 320-25 MG tablet   Other Relevant Orders   BASIC METABOLIC PANEL WITH GFR     Endocrine   Controlled type 2 diabetes mellitus without complication, without long-term current use of insulin (HCC)    A1C looks fantastic today at 6.4.  He is really done a great job bringing that down.  Continue with metformin.  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      Relevant Medications   metFORMIN (GLUCOPHAGE) 1000 MG tablet   valsartan-hydrochlorothiazide (DIOVAN-HCT) 320-25 MG tablet   Other Relevant Orders   POCT glycosylated hemoglobin (Hb A1C) (Completed)   BASIC METABOLIC PANEL WITH GFR     Other   Attention deficit hyperactivity disorder (ADHD)    Followed by Dr. Dwaine Gale.      Other Visit Diagnoses     Controlled type 2 diabetes mellitus with complication, without long-term current use of insulin (HCC)       Relevant Medications   metFORMIN (GLUCOPHAGE) 1000 MG tablet   valsartan-hydrochlorothiazide (DIOVAN-HCT) 320-25 MG tablet   Hypertension, unspecified type       Relevant Medications   metoprolol succinate (TOPROL-XL) 100 MG 24 hr tablet   valsartan-hydrochlorothiazide (DIOVAN-HCT) 320-25 MG tablet       Meds ordered this encounter  Medications   DISCONTD:  metFORMIN (GLUCOPHAGE) 1000 MG tablet    Sig: Take 1 tablet (1,000 mg total) by mouth 2 (two) times daily with a meal.    Dispense:  180 tablet    Refill:  3   DISCONTD: valsartan-hydrochlorothiazide (DIOVAN-HCT) 320-25 MG tablet    Sig: Take 1 tablet by mouth daily.    Dispense:  90 tablet    Refill:  3   DISCONTD: metoprolol succinate (TOPROL-XL) 100 MG 24 hr tablet    Sig: Take 1 tablet (100 mg total) by mouth daily. Take with or immediately following a meal.    Dispense:  90 tablet    Refill:  3   metFORMIN (GLUCOPHAGE) 1000 MG tablet    Sig: Take 1 tablet (1,000 mg total) by mouth 2 (two) times daily with a meal.    Dispense:  180 tablet    Refill:  3   metoprolol succinate (TOPROL-XL) 100 MG 24 hr tablet    Sig: Take 1 tablet (100 mg total) by mouth daily. Take with or immediately following a meal.    Dispense:  90 tablet    Refill:  3   valsartan-hydrochlorothiazide (DIOVAN-HCT) 320-25 MG tablet    Sig: Take 1 tablet by mouth daily.    Dispense:  90 tablet    Refill:  3    Follow-up: Return in about 4 months (around 03/02/2022) for Diabetes follow-up, Hypertension.    Beatrice Lecher, MD

## 2021-11-01 LAB — BASIC METABOLIC PANEL WITH GFR
BUN: 16 mg/dL (ref 7–25)
CO2: 29 mmol/L (ref 20–32)
Calcium: 9.8 mg/dL (ref 8.6–10.3)
Chloride: 103 mmol/L (ref 98–110)
Creat: 1.3 mg/dL (ref 0.70–1.30)
Glucose, Bld: 106 mg/dL — ABNORMAL HIGH (ref 65–99)
Potassium: 4.2 mmol/L (ref 3.5–5.3)
Sodium: 143 mmol/L (ref 135–146)
eGFR: 64 mL/min/{1.73_m2} (ref >=60)

## 2021-11-03 NOTE — Progress Notes (Signed)
Your lab work is within acceptable range and there are no concerning findings.   ?

## 2021-12-09 ENCOUNTER — Encounter: Payer: Self-pay | Admitting: Adult Health

## 2021-12-09 ENCOUNTER — Ambulatory Visit (INDEPENDENT_AMBULATORY_CARE_PROVIDER_SITE_OTHER): Payer: BLUE CROSS/BLUE SHIELD | Admitting: Adult Health

## 2021-12-09 DIAGNOSIS — G4700 Insomnia, unspecified: Secondary | ICD-10-CM

## 2021-12-09 DIAGNOSIS — F4310 Post-traumatic stress disorder, unspecified: Secondary | ICD-10-CM

## 2021-12-09 DIAGNOSIS — F909 Attention-deficit hyperactivity disorder, unspecified type: Secondary | ICD-10-CM | POA: Diagnosis not present

## 2021-12-09 MED ORDER — AMPHETAMINE-DEXTROAMPHETAMINE 20 MG PO TABS: ORAL_TABLET | ORAL | 0 refills | Status: DC

## 2021-12-09 NOTE — Progress Notes (Signed)
Gary Stevens ?[id number] ?10/23/1965 ?56 y.o. ? ?Virtual Visit via Telephone Note ? ?I connected with pt on 12/09/21 at  5:00 PM EDT by telephone and verified that I am speaking with the correct person using two identifiers. ?  ?I discussed the limitations, risks, security and privacy concerns of performing an evaluation and management service by telephone and the availability of in person appointments. I also discussed with the patient that there may be a patient responsible charge related to this service. The patient expressed understanding and agreed to proceed. ?  ?I discussed the assessment and treatment plan with the patient. The patient was provided an opportunity to ask questions and all were answered. The patient agreed with the plan and demonstrated an understanding of the instructions. ?  ?The patient was advised to call back or seek an in-person evaluation if the symptoms worsen or if the condition fails to improve as anticipated. ? ?I provided 20 minutes of non-face-to-face time during this encounter.  The patient was located at home.  The provider was located at [place]. ? ? ?Aloha Gell, NP ? ? ?Subjective:  ? ?Patient ID:  Gary Stevens is a 56 y.o. (DOB 06-29-1966) male. ? ?Chief Complaint: No chief complaint on file. ? ? ?HPI ?Gary Stevens presents for follow-up of ADHD, insomnia and PTSD. ? ?Describes mood today as "ok". Pleasant. Denies tearfulness. Mood symptoms - denies depression, anxiety, and irritability. Stating "I'm doing pretty good". Feels like medications continue to work well for him. He and wife doing well. Stable interest and motivation. Taking medications as prescribed. ?Energy levels stable. Active, does not have a regular exercise routine.  ?Enjoys some usual interests and activities. Married. Lives with wife. Mother and 3 sisters local.   ?Appetite adequate. Weight 275 pounds. ?Sleeps better some nights than others. Averages 6 to 8 hours with Seroquel.   ?Focus and concentration stable with Adderall. Completing tasks. Managing aspects of household. 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     ?Denies SI or HI.  ?Denies AH or VH.  ? ? ?Review of Systems:  ?Review of Systems  ?Musculoskeletal:  Negative for gait problem.  ?Neurological:  Negative for tremors.  ?Psychiatric/Behavioral:    ?     Please refer to HPI  ? ?Medications: I have reviewed the patient's current medications. ? ?Current Outpatient Medications  ?Medication Sig Dispense Refill  ? amphetamine-dextroamphetamine (ADDERALL) 20 MG tablet Take 1 tablet (20 mg total) by mouth 3 (three) times daily. 90 tablet 0  ? amphetamine-dextroamphetamine (ADDERALL) 20 MG tablet Take one tablet three times a day. 90 tablet 0  ? [START ON 01/06/2022] amphetamine-dextroamphetamine (ADDERALL) 20 MG tablet Take one tablet three times daily. 90 tablet 0  ? [START ON 02/03/2022] amphetamine-dextroamphetamine (ADDERALL) 20 MG tablet Take one tablet three times daily. 90 tablet 0  ? Blood Glucose Monitoring Suppl (ONE TOUCH ULTRA 2) w/Device KIT See admin instructions.  0  ? meloxicam (MOBIC) 15 MG tablet Take 1 tablet (15 mg total) by mouth daily. Take with food 15 tablet 0  ? metFORMIN (GLUCOPHAGE) 1000 MG tablet Take 1 tablet (1,000 mg total) by mouth 2 (two) times daily with a meal. 180 tablet 3  ? metoprolol succinate (TOPROL-XL) 100 MG 24 hr tablet Take 1 tablet (100 mg total) by mouth daily. Take with or immediately following a meal. 90 tablet 3  ? ONE TOUCH ULTRA TEST test strip Check blood sugar 4 times daily 300 each prn  ?  pantoprazole (PROTONIX) 20 MG tablet Take 1 tablet (20 mg total) by mouth daily. 90 tablet 3  ? QUEtiapine (SEROQUEL) 100 MG tablet Take 1-2 tablets by mouth at bedtime 180 tablet 3  ? valsartan-hydrochlorothiazide (DIOVAN-HCT) 320-25 MG tablet Take 1 tablet by mouth daily. 90 tablet 3  ? ?No current facility-administered medications for this visit.  ? ? ?Medication Side Effects: None ? ?Allergies:   ?Allergies  ?Allergen Reactions  ? Mirtazapine   ?  Other reaction(s): Unknown  ? ? ?Past Medical History:  ?Diagnosis Date  ? ADHD 01/15/2009  ? Qualifier: Diagnosis of  By: Madilyn Fireman MD, Barnetta Chapel    ? Anxiety state, unspecified 01/15/2009  ? Qualifier: Diagnosis of  By: Madilyn Fireman MD, Barnetta Chapel    ? Essential hypertension, benign 06/06/2008  ? Qualifier: Diagnosis of  By: Madilyn Fireman MD, Barnetta Chapel    ? HYPERTRIGLYCERIDEMIA 08/12/2009  ? Qualifier: Diagnosis of  By: Madilyn Fireman MD, Barnetta Chapel    ? HYPERTROPHY PROSTATE W/UR OBST & OTH LUTS 01/15/2009  ? Qualifier: Diagnosis of  By: Madilyn Fireman MD, Barnetta Chapel    ? INSOMNIA 06/06/2008  ? Qualifier: Diagnosis of  By: Madilyn Fireman MD, Barnetta Chapel    ? KNEE PAIN 08/12/2009  ? Qualifier: Diagnosis of  By: Madilyn Fireman MD, Barnetta Chapel    ? ? ?Family History  ?Problem Relation Age of Onset  ? Cirrhosis Father   ? Diabetes Mellitus II Father   ? Gallbladder disease Father   ? Gallbladder disease Sister   ? Gallbladder disease Son   ? ? ?Social History  ? ?Socioeconomic History  ? Marital status: Single  ?  Spouse name: Not on file  ? Number of children: Not on file  ? Years of education: Not on file  ? Highest education level: Not on file  ?Occupational History  ? Not on file  ?Tobacco Use  ? Smoking status: Every Day  ?  Packs/day: 1.00  ?  Years: 30.00  ?  Pack years: 30.00  ?  Types: Cigarettes  ? Smokeless tobacco: Never  ?Substance and Sexual Activity  ? Alcohol use: Not on file  ? Drug use: Not on file  ? Sexual activity: Not on file  ?Other Topics Concern  ? Not on file  ?Social History Narrative  ? Not on file  ? ?Social Determinants of Health  ? ?Financial Resource Strain: Not on file  ?Food Insecurity: Not on file  ?Transportation Needs: Not on file  ?Physical Activity: Not on file  ?Stress: Not on file  ?Social Connections: Not on file  ?Intimate Partner Violence: Not on file  ? ? ?Past Medical History, Surgical history, Social history, and Family history were reviewed and updated as  appropriate.  ? ?Please see review of systems for further details on the patient's review from today.  ? ?Objective:  ? ?Physical Exam:  ?There were no vitals taken for this visit. ? ?Physical Exam ?Constitutional:   ?   General: He is not in acute distress. ?Musculoskeletal:     ?   General: No deformity.  ?Neurological:  ?   Mental Status: He is alert and oriented to person, place, and time.  ?   Coordination: Coordination normal.  ?Psychiatric:     ?   Attention and Perception: Attention and perception normal. He does not perceive auditory or visual hallucinations.     ?   Mood and Affect: Mood normal. Mood is not anxious or depressed. Affect is not labile, blunt, angry or inappropriate.     ?  Speech: Speech normal.     ?   Behavior: Behavior normal.     ?   Thought Content: Thought content normal. Thought content is not paranoid or delusional. Thought content does not include homicidal or suicidal ideation. Thought content does not include homicidal or suicidal plan.     ?   Cognition and Memory: Cognition and memory normal.     ?   Judgment: Judgment normal.  ?   Comments: Insight intact  ? ? ?Lab Review:  ?   ?Component Value Date/Time  ? NA 143 10/31/2021 0000  ? NA 137 04/22/2018 0000  ? K 4.2 10/31/2021 0000  ? CL 103 10/31/2021 0000  ? CO2 29 10/31/2021 0000  ? GLUCOSE 106 (H) 10/31/2021 0000  ? BUN 16 10/31/2021 0000  ? BUN 12 04/22/2018 0000  ? CREATININE 1.30 10/31/2021 0000  ? CALCIUM 9.8 10/31/2021 0000  ? PROT 6.6 03/10/2021 1039  ? ALBUMIN 4.8 01/11/2017 1129  ? AST 29 03/10/2021 1039  ? ALT 27 03/10/2021 1039  ? ALKPHOS 224 (A) 04/22/2018 0000  ? BILITOT 0.9 03/10/2021 1039  ? GFRNONAA 81 03/18/2020 1003  ? GFRAA 94 03/18/2020 1003  ? ? ?   ?Component Value Date/Time  ? WBC 7.9 03/10/2021 1039  ? RBC 4.75 03/10/2021 1039  ? HGB 14.2 03/10/2021 1039  ? HCT 42.8 03/10/2021 1039  ? PLT 196 03/10/2021 1039  ? MCV 90.1 03/10/2021 1039  ? MCH 29.9 03/10/2021 1039  ? MCHC 33.2 03/10/2021 1039  ? RDW  13.0 03/10/2021 1039  ? ? ?No results found for: POCLITH, LITHIUM  ? ?No results found for: PHENYTOIN, PHENOBARB, VALPROATE, CBMZ  ? ?.res ?Assessment: Plan:   ? ?Plan: ? ?1. Adderall 22m TID - scripts printed - mailed

## 2022-03-02 ENCOUNTER — Ambulatory Visit: Payer: BLUE CROSS/BLUE SHIELD | Admitting: Family Medicine

## 2022-03-04 ENCOUNTER — Telehealth: Payer: Self-pay | Admitting: Family Medicine

## 2022-03-04 NOTE — Telephone Encounter (Signed)
Please call patient and see if he is willing to doing any of the current available options for colon cancer screening.

## 2022-03-05 ENCOUNTER — Other Ambulatory Visit: Payer: Self-pay

## 2022-03-05 ENCOUNTER — Telehealth: Payer: BLUE CROSS/BLUE SHIELD | Admitting: Adult Health

## 2022-03-05 ENCOUNTER — Encounter: Payer: Self-pay | Admitting: Family Medicine

## 2022-03-05 ENCOUNTER — Encounter: Payer: Self-pay | Admitting: Adult Health

## 2022-03-05 DIAGNOSIS — G4700 Insomnia, unspecified: Secondary | ICD-10-CM

## 2022-03-05 DIAGNOSIS — F331 Major depressive disorder, recurrent, moderate: Secondary | ICD-10-CM

## 2022-03-05 DIAGNOSIS — F909 Attention-deficit hyperactivity disorder, unspecified type: Secondary | ICD-10-CM

## 2022-03-05 DIAGNOSIS — Z1211 Encounter for screening for malignant neoplasm of colon: Secondary | ICD-10-CM

## 2022-03-05 MED ORDER — AMPHETAMINE-DEXTROAMPHETAMINE 20 MG PO TABS: ORAL_TABLET | ORAL | 0 refills | Status: DC

## 2022-03-05 MED ORDER — AMPHETAMINE-DEXTROAMPHETAMINE 20 MG PO TABS: 20.0000 mg | ORAL_TABLET | Freq: Three times a day (TID) | ORAL | 0 refills | Status: DC

## 2022-03-05 MED ORDER — QUETIAPINE FUMARATE 100 MG PO TABS: ORAL_TABLET | ORAL | 3 refills | Status: DC

## 2022-03-05 NOTE — Progress Notes (Signed)
Gary Stevens [id number] 06/03/1966 56 y.o.  Virtual Visit via Video Note  I connected with pt @ on 03/05/22 at  1:00 PM EDT by a video enabled telemedicine application and verified that I am speaking with the correct person using two identifiers.   I discussed the limitations of evaluation and management by telemedicine and the availability of in person appointments. The patient expressed understanding and agreed to proceed.  I discussed the assessment and treatment plan with the patient. The patient was provided an opportunity to ask questions and all were answered. The patient agreed with the plan and demonstrated an understanding of the instructions.   The patient was advised to call back or seek an in-person evaluation if the symptoms worsen or if the condition fails to improve as anticipated.  I provided 20 minutes of non-face-to-face time during this encounter.  The patient was located at home.  The provider was located at [facility].    N , NP   Subjective:   Patient ID:  Gary Stevens is a 56 y.o. (DOB 12/12/1965) male.  Chief Complaint: No chief complaint on file.   HPI Gary Stevens presents for follow-up of ADHD, insomnia and PTSD.  Describes mood today as "ok". Pleasant. Denies tearfulness. Mood symptoms - denies depression, anxiety, and irritability. Stating "I'm doing great". Feels like medications continue to work well for him. He and wife doing well. Stable interest and motivation. Taking medications as prescribed. Energy levels stable. Active, does not have a regular exercise routine.  Enjoys some usual interests and activities. Married. Lives with wife. Mother and 3 sisters local.   Appetite adequate. Weight 275 pounds. Sleeps well most nights. Averages 6 to 8 hours with Seroquel.  Focus and concentration stable with Adderall. Completing tasks. Managing aspects of household. Work going well - OTR truck driver.     Denies SI or HI.   Denies AH or VH.   A1C 5.6   Review of Systems:  Review of Systems  Musculoskeletal:  Negative for gait problem.  Neurological:  Negative for tremors.  Psychiatric/Behavioral:         Please refer to HPI    Medications: I have reviewed the patient's current medications.  Current Outpatient Medications  Medication Sig Dispense Refill   amphetamine-dextroamphetamine (ADDERALL) 20 MG tablet Take 1 tablet (20 mg total) by mouth 3 (three) times daily. 90 tablet 0   amphetamine-dextroamphetamine (ADDERALL) 20 MG tablet Take one tablet three times a day. 90 tablet 0   amphetamine-dextroamphetamine (ADDERALL) 20 MG tablet Take one tablet three times daily. 90 tablet 0   amphetamine-dextroamphetamine (ADDERALL) 20 MG tablet Take one tablet three times daily. 90 tablet 0   Blood Glucose Monitoring Suppl (ONE TOUCH ULTRA 2) w/Device KIT See admin instructions.  0   meloxicam (MOBIC) 15 MG tablet Take 1 tablet (15 mg total) by mouth daily. Take with food 15 tablet 0   metFORMIN (GLUCOPHAGE) 1000 MG tablet Take 1 tablet (1,000 mg total) by mouth 2 (two) times daily with a meal. 180 tablet 3   metoprolol succinate (TOPROL-XL) 100 MG 24 hr tablet Take 1 tablet (100 mg total) by mouth daily. Take with or immediately following a meal. 90 tablet 3   ONE TOUCH ULTRA TEST test strip Check blood sugar 4 times daily 300 each prn   pantoprazole (PROTONIX) 20 MG tablet Take 1 tablet (20 mg total) by mouth daily. 90 tablet 3   QUEtiapine (SEROQUEL) 100 MG tablet Take 1-2 tablets   by mouth at bedtime 180 tablet 3   valsartan-hydrochlorothiazide (DIOVAN-HCT) 320-25 MG tablet Take 1 tablet by mouth daily. 90 tablet 3   No current facility-administered medications for this visit.    Medication Side Effects: None  Allergies:  Allergies  Allergen Reactions   Mirtazapine     Other reaction(s): Unknown    Past Medical History:  Diagnosis Date   ADHD 01/15/2009   Qualifier: Diagnosis of  By: Madilyn Fireman MD,  Catherine     Anxiety state, unspecified 01/15/2009   Qualifier: Diagnosis of  By: Madilyn Fireman MD, Catherine     Essential hypertension, benign 06/06/2008   Qualifier: Diagnosis of  By: Madilyn Fireman MD, Latanya Maudlin 08/12/2009   Qualifier: Diagnosis of  By: Madilyn Fireman MD, [place] W/UR OBST & OTH LUTS 01/15/2009   Qualifier: Diagnosis of  By: Madilyn Fireman MD, Lowella Petties 06/06/2008   Qualifier: Diagnosis of  By: Madilyn Fireman MD, [place]     KNEE PAIN 08/12/2009   Qualifier: Diagnosis of  By: Madilyn Fireman MD, Idelle Crouch History  Problem Relation Age of Onset   Cirrhosis Father    Diabetes Mellitus II Father    Gallbladder disease Father    Gallbladder disease Sister    Gallbladder disease Son     Social History   Socioeconomic History   Marital status: Single    Spouse name: Not on file   Number of children: Not on file   Years of education: Not on file   Highest education level: Not on file  Occupational History   Not on file  Tobacco Use   Smoking status: Every Day    Packs/day: 1.00    Years: 30.00    Total pack years: 30.00    Types: Cigarettes   Smokeless tobacco: Never  Substance and Sexual Activity   Alcohol use: Not on file   Drug use: Not on file   Sexual activity: Not on file  Other Topics Concern   Not on file  Social History Narrative   Not on file   Social Determinants of Health   Financial Resource Strain: Not on file  Food Insecurity: Not on file  Transportation Needs: Not on file  Physical Activity: Not on file  Stress: Not on file  Social Connections: Not on file  Intimate Partner Violence: Not on file    Past Medical History, Surgical history, Social history, and Family history were reviewed and updated as appropriate.   Please see review of systems for further details on the patient's review from today.   Objective:   Physical Exam:  There were no vitals taken for this  visit.  Physical Exam Constitutional:      General: He is not in acute distress. Musculoskeletal:        General: No deformity.  Neurological:     Mental Status: He is alert and oriented to person, place, and time.     Coordination: Coordination normal.  Psychiatric:        Attention and Perception: Attention and perception normal. He does not perceive auditory or visual hallucinations.        Mood and Affect: Mood normal. Mood is not anxious or depressed. Affect is not labile, blunt, angry or inappropriate.        Speech: Speech normal.        Behavior: Behavior normal.        Thought Content: Thought content normal.  Thought content is not paranoid or delusional. Thought content does not include homicidal or suicidal ideation. Thought content does not include homicidal or suicidal plan.        Cognition and Memory: Cognition and memory normal.        Judgment: Judgment normal.     Comments: Insight intact     Lab Review:     Component Value Date/Time   NA 143 10/31/2021 0000   NA 137 04/22/2018 0000   K 4.2 10/31/2021 0000   CL 103 10/31/2021 0000   CO2 29 10/31/2021 0000   GLUCOSE 106 (H) 10/31/2021 0000   BUN 16 10/31/2021 0000   BUN 12 04/22/2018 0000   CREATININE 1.30 10/31/2021 0000   CALCIUM 9.8 10/31/2021 0000   PROT 6.6 03/10/2021 1039   ALBUMIN 4.8 01/11/2017 1129   AST 29 03/10/2021 1039   ALT 27 03/10/2021 1039   ALKPHOS 224 (A) 04/22/2018 0000   BILITOT 0.9 03/10/2021 1039   GFRNONAA 81 03/18/2020 1003   GFRAA 94 03/18/2020 1003       Component Value Date/Time   WBC 7.9 03/10/2021 1039   RBC 4.75 03/10/2021 1039   HGB 14.2 03/10/2021 1039   HCT 42.8 03/10/2021 1039   PLT 196 03/10/2021 1039   MCV 90.1 03/10/2021 1039   MCH 29.9 03/10/2021 1039   MCHC 33.2 03/10/2021 1039   RDW 13.0 03/10/2021 1039    No results found for: "POCLITH", "LITHIUM"   No results found for: "PHENYTOIN", "PHENOBARB", "VALPROATE", "CBMZ"   .res Assessment: Plan:     Plan:  1. Adderall 80m TID - scripts printed - mailed script. 2. Seroquel 1061m- 1 to 2 at hs  RTC 3 months  Time spent with patient was 20 minutes. Greater than 50% of face to face time with patient was spent on counseling and coordination of care.   Discussed potential metabolic side effects associated with atypical antipsychotics, as well as potential risk for movement side effects. Advised pt to contact office if movement side effects occur.   Discussed potential benefits, risks, and side effects of stimulants with patient to include increased heart rate, palpitations, insomnia, increased anxiety, increased irritability, or decreased appetite.  Instructed patient to contact office if experiencing any significant tolerability issues. Diagnoses and all orders for this visit:  Insomnia, unspecified type  Major depressive disorder, recurrent episode, moderate (HCLoving Attention deficit hyperactivity disorder (ADHD), unspecified ADHD type     Please see After Visit Summary for patient specific instructions.  No future appointments.   No orders of the defined types were placed in this encounter.     -------------------------------

## 2022-03-05 NOTE — Telephone Encounter (Signed)
Sent MyChart message with the information and instructions for cologuard. Place order for cologuard.

## 2022-05-05 ENCOUNTER — Ambulatory Visit: Payer: BLUE CROSS/BLUE SHIELD | Admitting: Family Medicine

## 2022-05-13 ENCOUNTER — Ambulatory Visit: Payer: BLUE CROSS/BLUE SHIELD | Admitting: Family Medicine

## 2022-05-13 ENCOUNTER — Encounter: Payer: Self-pay | Admitting: Family Medicine

## 2022-05-13 VITALS — BP 148/100 | HR 60 | Wt 269.0 lb

## 2022-05-13 DIAGNOSIS — I10 Essential (primary) hypertension: Secondary | ICD-10-CM

## 2022-05-13 DIAGNOSIS — Z125 Encounter for screening for malignant neoplasm of prostate: Secondary | ICD-10-CM

## 2022-05-13 DIAGNOSIS — M792 Neuralgia and neuritis, unspecified: Secondary | ICD-10-CM | POA: Diagnosis not present

## 2022-05-13 DIAGNOSIS — E119 Type 2 diabetes mellitus without complications: Secondary | ICD-10-CM

## 2022-05-13 DIAGNOSIS — J069 Acute upper respiratory infection, unspecified: Secondary | ICD-10-CM

## 2022-05-13 LAB — POCT UA - MICROALBUMIN
Albumin/Creatinine Ratio, Urine, POC: 30
Creatinine, POC: 300 mg/dL
Microalbumin Ur, POC: 30 mg/L

## 2022-05-13 LAB — POCT GLYCOSYLATED HEMOGLOBIN (HGB A1C): Hemoglobin A1C: 6.7 % — AB (ref 4.0–5.6)

## 2022-05-13 MED ORDER — OLMESARTAN-AMLODIPINE-HCTZ 40-5-25 MG PO TABS: 1.0000 | ORAL_TABLET | Freq: Every day | ORAL | 1 refills | Status: DC

## 2022-05-13 NOTE — Assessment & Plan Note (Signed)
a1c looks good overall but up from prior. Encouraged to work on diet and exercise.    Lab Results  Component Value Date   HGBA1C 6.7 (A) 05/13/2022

## 2022-05-13 NOTE — Assessment & Plan Note (Signed)
Pressure is quite elevated today but he did take Sudafed.  Though it looks like his blood pressure was high back in March so I think were continued to make an adjustment to his regimen.

## 2022-05-13 NOTE — Progress Notes (Signed)
Established Patient Office Visit  Subjective   Patient ID: Gary Stevens, male    DOB: 11-06-1965  Age: 56 y.o. MRN: [id number]  Chief Complaint  Patient presents with   Diabetes   Hypertension    HPI  Diabetes - no hypoglycemic events. No wounds or sores that are not healing well. No increased thirst or urination. Checking glucose at home. Taking medications as prescribed without any side effects.  Hypertension- Pt denies chest pain, SOB, dizziness, or heart palpitations.  Taking meds as directed w/o problems.  Denies medication side effects.    He has had some cough and sinus congestion for a couple of days.  No fevers.  He has been taking vitamin C, zinc, and Sudafed.  He  He also c/o of right arm burning sensation it goes mostly from his elbow down his hand into his thumb and first and middle fingers.  He says it is constant now its been going on for a couple of months.  Occasionally will notice some weakness.  He uses IcyHot and then places a sleeve over it.    ROS    Objective:     BP (!) 148/100   Pulse 60   Wt 269 lb (122 kg)   SpO2 96%   BMI 33.62 kg/m    Physical Exam Constitutional:      Appearance: He is well-developed.  HENT:     Head: Normocephalic and atraumatic.     Right Ear: External ear normal.     Left Ear: External ear normal.     Nose: Nose normal.  Eyes:     Conjunctiva/sclera: Conjunctivae normal.     Pupils: Pupils are equal, round, and reactive to light.  Neck:     Thyroid: No thyromegaly.  Cardiovascular:     Rate and Rhythm: Normal rate and regular rhythm.     Heart sounds: Normal heart sounds.  Pulmonary:     Effort: Pulmonary effort is normal.     Breath sounds: Normal breath sounds.  Musculoskeletal:     Cervical back: Neck supple.  Lymphadenopathy:     Cervical: No cervical adenopathy.  Skin:    General: Skin is warm and dry.  Neurological:     Mental Status: He is alert and oriented to person, place, and time.   Psychiatric:        Behavior: Behavior normal.     Results for orders placed or performed in visit on 05/13/22  POCT glycosylated hemoglobin (Hb A1C)  Result Value Ref Range   Hemoglobin A1C 6.7 (A) 4.0 - 5.6 %   HbA1c POC (<> result, manual entry)     HbA1c, POC (prediabetic range)     HbA1c, POC (controlled diabetic range)    POCT UA - Microalbumin  Result Value Ref Range   Microalbumin Ur, POC 30 mg/L   Creatinine, POC 300 mg/dL   Albumin/Creatinine Ratio, Urine, POC 30       The 10-year ASCVD risk score (Arnett DK, et al., 2019) is: 29.3%    Assessment & Plan:   Problem List Items Addressed This Visit       Cardiovascular and Mediastinum   Essential hypertension    Pressure is quite elevated today but he did take Sudafed.  Though it looks like his blood pressure was high back in March so I think were continued to make an adjustment to his regimen.      Relevant Medications   Olmesartan-amLODIPine-HCTZ 40-5-25 MG TABS   Other  Relevant Orders   POCT glycosylated hemoglobin (Hb A1C) (Completed)   POCT UA - Microalbumin (Completed)   COMPLETE METABOLIC PANEL WITH GFR   Lipid panel   PSA   CBC     Endocrine   Controlled type 2 diabetes mellitus without complication, without long-term current use of insulin (HCC) - Primary    a1c looks good overall but up from prior. Encouraged to work on diet and exercise.    Lab Results  Component Value Date   HGBA1C 6.7 (A) 05/13/2022         Relevant Medications   Olmesartan-amLODIPine-HCTZ 40-5-25 MG TABS   Other Relevant Orders   POCT glycosylated hemoglobin (Hb A1C) (Completed)   POCT UA - Microalbumin (Completed)   COMPLETE METABOLIC PANEL WITH GFR   Lipid panel   PSA   CBC   Other Visit Diagnoses     Screening for prostate cancer       Relevant Orders   POCT glycosylated hemoglobin (Hb A1C) (Completed)   POCT UA - Microalbumin (Completed)   COMPLETE METABOLIC PANEL WITH GFR   Lipid panel   PSA   CBC    Radicular pain in left arm       Viral upper respiratory tract infection          Forearm burning sensation is in the distribution of the median nerve to some degree we discussed it could be coming from carpal tunnel versus his cervical spine he has had issues with an old injury].  We will get him referred for further work-up and evaluation he may end up needing a nerve conduction study.   URI -COVID-19 test performed. Negative.   Symptoms most consistent with a viral illness.  Recommend continue symptomatic care. Call if not better in one week.  Return in about 3 weeks (around 06/03/2022) for Visit to recheck blood pressure on new medication.Nani Gasser, MD

## 2022-05-13 NOTE — Patient Instructions (Addendum)
Please schedule an appointment with Dr. Benjamin Stain for the right forearm radicular pain.  Please discontinue the Diovan HCTZ and pick up the new blood pressure pill.  Is the generic of Tribenzor and it is 1 pill once a day and will take the place of the Diovan HCTZ is a little bit more powerful to help lower your blood pressure.

## 2022-06-03 ENCOUNTER — Ambulatory Visit: Payer: BLUE CROSS/BLUE SHIELD | Admitting: Family Medicine

## 2022-06-05 ENCOUNTER — Encounter: Payer: Self-pay | Admitting: Adult Health

## 2022-06-05 ENCOUNTER — Telehealth: Payer: BLUE CROSS/BLUE SHIELD | Admitting: Adult Health

## 2022-06-05 DIAGNOSIS — F909 Attention-deficit hyperactivity disorder, unspecified type: Secondary | ICD-10-CM

## 2022-06-05 DIAGNOSIS — G4700 Insomnia, unspecified: Secondary | ICD-10-CM

## 2022-06-05 DIAGNOSIS — F4310 Post-traumatic stress disorder, unspecified: Secondary | ICD-10-CM

## 2022-06-05 NOTE — Progress Notes (Signed)
Gary Stevens [id number] 16-Jun-1966 56 y.o.  Virtual Visit via Video Note  I connected with pt @ on 06/05/22 at  1:00 PM EDT by a video enabled telemedicine application and verified that I am speaking with the correct person using two identifiers.   I discussed the limitations of evaluation and management by telemedicine and the availability of in person appointments. The patient expressed understanding and agreed to proceed.  I discussed the assessment and treatment plan with the patient. The patient was provided an opportunity to ask questions and all were answered. The patient agreed with the plan and demonstrated an understanding of the instructions.   The patient was advised to call back or seek an in-person evaluation if the symptoms worsen or if the condition fails to improve as anticipated.  I provided 20 minutes of non-face-to-face time during this encounter.  The patient was located at home.  The provider was located at [place].   Gary Gell, NP   Subjective:   Patient ID:  Gary Stevens is a 56 y.o. (DOB 1966/07/31) male.  Chief Complaint: No chief complaint on file.   HPI Gary Stevens presents for follow-up of ADHD, insomnia and PTSD.  Describes mood today as "ok". Pleasant. Denies tearfulness. Mood symptoms - denies depression, anxiety and irritability. Mood is consistent. Stating "I'm doing good". Feels like medications continue to work well for him. He and wife doing well. Stable interest and motivation. Taking medications as prescribed. Energy levels stable. Active, does not have a regular exercise routine.  Enjoys some usual interests and activities. Married. Lives with wife. Mother and 3 sisters local.   Appetite adequate. Weight 275 pounds. Sleeps well most nights. Averages 6 to 8 hours with Seroquel.  Focus and concentration stable with Adderall. Completing tasks. Managing aspects of household. 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      Denies SI or HI.  Denies AH or VH.   Review of Systems:  Review of Systems  Musculoskeletal:  Negative for gait problem.  Neurological:  Negative for tremors.  Psychiatric/Behavioral:         Please refer to HPI    Medications: I have reviewed the patient's current medications.  Current Outpatient Medications  Medication Sig Dispense Refill   amphetamine-dextroamphetamine (ADDERALL) 20 MG tablet Take one tablet three times daily. 90 tablet 0   amphetamine-dextroamphetamine (ADDERALL) 20 MG tablet Take 1 tablet (20 mg total) by mouth 3 (three) times daily. 90 tablet 0   amphetamine-dextroamphetamine (ADDERALL) 20 MG tablet Take one tablet three times a day. 90 tablet 0   amphetamine-dextroamphetamine (ADDERALL) 20 MG tablet Take one tablet three times daily. 90 tablet 0   Blood Glucose Monitoring Suppl (ONE TOUCH ULTRA 2) w/Device KIT See admin instructions.  0   meloxicam (MOBIC) 15 MG tablet Take 1 tablet (15 mg total) by mouth daily. Take with food 15 tablet 0   metFORMIN (GLUCOPHAGE) 1000 MG tablet Take 1 tablet (1,000 mg total) by mouth 2 (two) times daily with a meal. 180 tablet 3   metoprolol succinate (TOPROL-XL) 100 MG 24 hr tablet Take 1 tablet (100 mg total) by mouth daily. Take with or immediately following a meal. 90 tablet 3   Olmesartan-amLODIPine-HCTZ 40-5-25 MG TABS Take 1 tablet by mouth daily. 90 tablet 1   ONE TOUCH ULTRA TEST test strip Check blood sugar 4 times daily 300 each prn   pantoprazole (PROTONIX) 20 MG tablet Take 1 tablet (20 mg total) by mouth  daily. 90 tablet 3   QUEtiapine (SEROQUEL) 100 MG tablet Take 1-2 tablets by mouth at bedtime 180 tablet 3   No current facility-administered medications for this visit.    Medication Side Effects: None  Allergies:  Allergies  Allergen Reactions   Mirtazapine     Other reaction(s): Unknown    Past Medical History:  Diagnosis Date   ADHD 01/15/2009   Qualifier: Diagnosis of  By: Madilyn Fireman MD, Catherine      Anxiety state, unspecified 01/15/2009   Qualifier: Diagnosis of  By: Madilyn Fireman MD, Catherine     Essential hypertension, benign 06/06/2008   Qualifier: Diagnosis of  By: Madilyn Fireman MD, Latanya Maudlin 08/12/2009   Qualifier: Diagnosis of  By: Madilyn Fireman MD, [place] W/UR OBST & OTH LUTS 01/15/2009   Qualifier: Diagnosis of  By: Madilyn Fireman MD, Lowella Petties 06/06/2008   Qualifier: Diagnosis of  By: Madilyn Fireman MD, [place]     KNEE PAIN 08/12/2009   Qualifier: Diagnosis of  By: Madilyn Fireman MD, Idelle Crouch History  Problem Relation Age of Onset   Cirrhosis Father    Diabetes Mellitus II Father    Gallbladder disease Father    Gallbladder disease Sister    Gallbladder disease Son     Social History   Socioeconomic History   Marital status: Single    Spouse name: Not on file   Number of children: Not on file   Years of education: Not on file   Highest education level: Not on file  Occupational History   Not on file  Tobacco Use   Smoking status: Every Day    Packs/day: 1.00    Years: 30.00    Total pack years: 30.00    Types: Cigarettes   Smokeless tobacco: Never  Substance and Sexual Activity   Alcohol use: Not on file   Drug use: Not on file   Sexual activity: Not on file  Other Topics Concern   Not on file  Social History Narrative   Not on file   Social Determinants of Health   Financial Resource Strain: Not on file  Food Insecurity: Not on file  Transportation Needs: Not on file  Physical Activity: Not on file  Stress: Not on file  Social Connections: Not on file  Intimate Partner Violence: Not on file    Past Medical History, Surgical history, Social history, and Family history were reviewed and updated as appropriate.   Please see review of systems for further details on the patient's review from today.   Objective:   Physical Exam:  There were no vitals taken for this visit.  Physical  Exam Constitutional:      General: He is not in acute distress. Musculoskeletal:        General: No deformity.  Neurological:     Mental Status: He is alert and oriented to person, place, and time.     Coordination: Coordination normal.  Psychiatric:        Attention and Perception: Attention and perception normal. He does not perceive auditory or visual hallucinations.        Mood and Affect: Mood normal. Mood is not anxious or depressed. Affect is not labile, blunt, angry or inappropriate.        Speech: Speech normal.        Behavior: Behavior normal.        Thought Content: Thought content normal. Thought content  is not paranoid or delusional. Thought content does not include homicidal or suicidal ideation. Thought content does not include homicidal or suicidal plan.        Cognition and Memory: Cognition and memory normal.        Judgment: Judgment normal.     Comments: Insight intact     Lab Review:     Component Value Date/Time   NA 143 10/31/2021 0000   NA 137 04/22/2018 0000   K 4.2 10/31/2021 0000   CL 103 10/31/2021 0000   CO2 29 10/31/2021 0000   GLUCOSE 106 (H) 10/31/2021 0000   BUN 16 10/31/2021 0000   BUN 12 04/22/2018 0000   CREATININE 1.30 10/31/2021 0000   CALCIUM 9.8 10/31/2021 0000   PROT 6.6 03/10/2021 1039   ALBUMIN 4.8 01/11/2017 1129   AST 29 03/10/2021 1039   ALT 27 03/10/2021 1039   ALKPHOS 224 (A) 04/22/2018 0000   BILITOT 0.9 03/10/2021 1039   GFRNONAA 81 03/18/2020 1003   GFRAA 94 03/18/2020 1003       Component Value Date/Time   WBC 7.9 03/10/2021 1039   RBC 4.75 03/10/2021 1039   HGB 14.2 03/10/2021 1039   HCT 42.8 03/10/2021 1039   PLT 196 03/10/2021 1039   MCV 90.1 03/10/2021 1039   MCH 29.9 03/10/2021 1039   MCHC 33.2 03/10/2021 1039   RDW 13.0 03/10/2021 1039    No results found for: "POCLITH", "LITHIUM"   No results found for: "PHENYTOIN", "PHENOBARB", "VALPROATE", "CBMZ"   .res Assessment: Plan:    Plan:  1.  Adderall 46m TID - scripts printed - mailed script. 2. Seroquel 1067m- 1 to 2 at hs  RTC 3 months  Monitor BP between visits while taking stimulant medication.   Time spent with patient was 20 minutes. Greater than 50% of face to face time with patient was spent on counseling and coordination of care.   Discussed potential metabolic side effects associated with atypical antipsychotics, as well as potential risk for movement side effects. Advised pt to contact office if movement side effects occur.   Discussed potential benefits, risks, and side effects of stimulants with patient to include increased heart rate, palpitations, insomnia, increased anxiety, increased irritability, or decreased appetite.  Instructed patient to contact office if experiencing any significant tolerability issues. Diagnoses and all orders for this visit:  Attention deficit hyperactivity disorder (ADHD), unspecified ADHD type  PTSD (post-traumatic stress disorder)  Insomnia, unspecified type     Please see After Visit Summary for patient specific instructions.  No future appointments.   No orders of the defined types were placed in this encounter.     -------------------------------

## 2022-06-24 ENCOUNTER — Telehealth: Payer: Self-pay | Admitting: Adult Health

## 2022-06-24 ENCOUNTER — Other Ambulatory Visit: Payer: Self-pay

## 2022-06-24 DIAGNOSIS — F909 Attention-deficit hyperactivity disorder, unspecified type: Secondary | ICD-10-CM

## 2022-06-24 MED ORDER — AMPHETAMINE-DEXTROAMPHETAMINE 20 MG PO TABS: 20.0000 mg | ORAL_TABLET | Freq: Three times a day (TID) | ORAL | 0 refills | Status: DC

## 2022-06-24 MED ORDER — AMPHETAMINE-DEXTROAMPHETAMINE 20 MG PO TABS: ORAL_TABLET | ORAL | 0 refills | Status: DC

## 2022-06-24 NOTE — Telephone Encounter (Signed)
Pended.

## 2022-06-24 NOTE — Telephone Encounter (Signed)
Pt called at 3:55p.  He would like refill of Adderall 20mg s sent to:  [facility] at  [place], [place] [address]

## 2022-08-28 ENCOUNTER — Other Ambulatory Visit: Payer: Self-pay

## 2022-08-28 ENCOUNTER — Telehealth: Payer: Self-pay | Admitting: Adult Health

## 2022-08-28 DIAGNOSIS — F909 Attention-deficit hyperactivity disorder, unspecified type: Secondary | ICD-10-CM

## 2022-08-28 MED ORDER — AMPHETAMINE-DEXTROAMPHETAMINE 20 MG PO TABS: ORAL_TABLET | ORAL | 0 refills | Status: DC

## 2022-08-28 NOTE — Telephone Encounter (Signed)
Pt called again today asking for a refill on his adderall 20 mg. Pharmacy is [facility] [address], 

## 2022-08-28 NOTE — Telephone Encounter (Signed)
Pended.

## 2022-09-09 ENCOUNTER — Encounter: Payer: Self-pay | Admitting: Adult Health

## 2022-09-09 ENCOUNTER — Telehealth (INDEPENDENT_AMBULATORY_CARE_PROVIDER_SITE_OTHER): Payer: BLUE CROSS/BLUE SHIELD | Admitting: Adult Health

## 2022-09-09 DIAGNOSIS — G4700 Insomnia, unspecified: Secondary | ICD-10-CM

## 2022-09-09 DIAGNOSIS — F4310 Post-traumatic stress disorder, unspecified: Secondary | ICD-10-CM | POA: Diagnosis not present

## 2022-09-09 DIAGNOSIS — F909 Attention-deficit hyperactivity disorder, unspecified type: Secondary | ICD-10-CM | POA: Diagnosis not present

## 2022-09-09 NOTE — Progress Notes (Signed)
Gary Stevens [id number] 06-20-1966 57 y.o.  Virtual Visit via Video Note  I connected with pt @ on 09/09/22 at 10:00 AM EST by a video enabled telemedicine application and verified that I am speaking with the correct person using two identifiers.   I discussed the limitations of evaluation and management by telemedicine and the availability of in person appointments. The patient expressed understanding and agreed to proceed.  I discussed the assessment and treatment plan with the patient. The patient was provided an opportunity to ask questions and all were answered. The patient agreed with the plan and demonstrated an understanding of the instructions.   The patient was advised to call back or seek an in-person evaluation if the symptoms worsen or if the condition fails to improve as anticipated.  I provided 20 minutes of non-face-to-face time during this encounter.  The patient was located at home.  The provider was located at [facility].   Gary Gibbs, NP   Subjective:   Patient ID:  Gary Stevens is a 57 y.o. (DOB May 31, 1966) male.  Chief Complaint: No chief complaint on file.   HPI Gary Stevens presents for follow-up of ADHD, insomnia and PTSD.  Describes mood today as "ok". Pleasant. Denies tearfulness. Mood symptoms - denies depression, anxiety and irritability. Mood is consistent. Stating "I'm doing alright". Feels like medications continue to work well for him. He and wife doing well. Stable interest and motivation. Taking medications as prescribed. Energy levels stable. Active, does not have a regular exercise routine.  Enjoys some usual interests and activities. Married. Lives with wife. Mother and 3 sisters local.   Appetite adequate. Weight loss - diabetic. Sleeps well most nights. Averages 6 to 8 hours with Seroquel.  Focus and concentration stable with Adderall. Completing tasks. Managing aspects of household. 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      Denies SI or HI.  Denies AH or VH.  Denies substance use. Denies self harm.  Review of Systems:  Review of Systems  Musculoskeletal:  Negative for gait problem.  Neurological:  Negative for tremors.  Psychiatric/Behavioral:         Please refer to HPI    Medications: I have reviewed the patient's current medications.  Current Outpatient Medications  Medication Sig Dispense Refill   amphetamine-dextroamphetamine (ADDERALL) 20 MG tablet Take one tablet three times daily. 90 tablet 0   amphetamine-dextroamphetamine (ADDERALL) 20 MG tablet Take 1 tablet (20 mg total) by mouth 3 (three) times daily. 90 tablet 0   amphetamine-dextroamphetamine (ADDERALL) 20 MG tablet Take one tablet three times a day. 90 tablet 0   amphetamine-dextroamphetamine (ADDERALL) 20 MG tablet Take one tablet three times daily. 90 tablet 0   Blood Glucose Monitoring Suppl (ONE TOUCH ULTRA 2) w/Device KIT See admin instructions.  0   meloxicam (MOBIC) 15 MG tablet Take 1 tablet (15 mg total) by mouth daily. Take with food 15 tablet 0   metFORMIN (GLUCOPHAGE) 1000 MG tablet Take 1 tablet (1,000 mg total) by mouth 2 (two) times daily with a meal. 180 tablet 3   metoprolol succinate (TOPROL-XL) 100 MG 24 hr tablet Take 1 tablet (100 mg total) by mouth daily. Take with or immediately following a meal. 90 tablet 3   Olmesartan-amLODIPine-HCTZ 40-5-25 MG TABS Take 1 tablet by mouth daily. 90 tablet 1   ONE TOUCH ULTRA TEST test strip Check blood sugar 4 times daily 300 each prn   pantoprazole (PROTONIX) 20 MG tablet Take 1  tablet (20 mg total) by mouth daily. 90 tablet 3   QUEtiapine (SEROQUEL) 100 MG tablet Take 1-2 tablets by mouth at bedtime 180 tablet 3   No current facility-administered medications for this visit.    Medication Side Effects: None  Allergies:  Allergies  Allergen Reactions   Mirtazapine     Other reaction(s): Unknown    Past Medical History:  Diagnosis Date   ADHD 01/15/2009    Qualifier: Diagnosis of  By: Madilyn Fireman MD, Catherine     Anxiety state, unspecified 01/15/2009   Qualifier: Diagnosis of  By: Madilyn Fireman MD, Catherine     Essential hypertension, benign 06/06/2008   Qualifier: Diagnosis of  By: Madilyn Fireman MD, Latanya Maudlin 08/12/2009   Qualifier: Diagnosis of  By: Madilyn Fireman MD, [place] W/UR OBST & OTH LUTS 01/15/2009   Qualifier: Diagnosis of  By: Madilyn Fireman MD, Lowella Petties 06/06/2008   Qualifier: Diagnosis of  By: Madilyn Fireman MD, [place]     KNEE PAIN 08/12/2009   Qualifier: Diagnosis of  By: Madilyn Fireman MD, Idelle Crouch History  Problem Relation Age of Onset   Cirrhosis Father    Diabetes Mellitus II Father    Gallbladder disease Father    Gallbladder disease Sister    Gallbladder disease Son     Social History   Socioeconomic History   Marital status: Single    Spouse name: Not on file   Number of children: Not on file   Years of education: Not on file   Highest education level: Not on file  Occupational History   Not on file  Tobacco Use   Smoking status: Every Day    Packs/day: 1.00    Years: 30.00    Total pack years: 30.00    Types: Cigarettes   Smokeless tobacco: Never  Substance and Sexual Activity   Alcohol use: Not on file   Drug use: Not on file   Sexual activity: Not on file  Other Topics Concern   Not on file  Social History Narrative   Not on file   Social Determinants of Health   Financial Resource Strain: Not on file  Food Insecurity: Not on file  Transportation Needs: Not on file  Physical Activity: Not on file  Stress: Not on file  Social Connections: Not on file  Intimate Partner Violence: Not on file    Past Medical History, Surgical history, Social history, and Family history were reviewed and updated as appropriate.   Please see review of systems for further details on the patient's review from today.   Objective:   Physical Exam:  There were  no vitals taken for this visit.  Physical Exam Constitutional:      General: He is not in acute distress. Musculoskeletal:        General: No deformity.  Neurological:     Mental Status: He is alert and oriented to person, place, and time.     Coordination: Coordination normal.  Psychiatric:        Attention and Perception: Attention and perception normal. He does not perceive auditory or visual hallucinations.        Mood and Affect: Mood normal. Mood is not anxious or depressed. Affect is not labile, blunt, angry or inappropriate.        Speech: Speech normal.        Behavior: Behavior normal.        Thought  Content: Thought content normal. Thought content is not paranoid or delusional. Thought content does not include homicidal or suicidal ideation. Thought content does not include homicidal or suicidal plan.        Cognition and Memory: Cognition and memory normal.        Judgment: Judgment normal.     Comments: Insight intact     Lab Review:     Component Value Date/Time   NA 143 10/31/2021 0000   NA 137 04/22/2018 0000   K 4.2 10/31/2021 0000   CL 103 10/31/2021 0000   CO2 29 10/31/2021 0000   GLUCOSE 106 (H) 10/31/2021 0000   BUN 16 10/31/2021 0000   BUN 12 04/22/2018 0000   CREATININE 1.30 10/31/2021 0000   CALCIUM 9.8 10/31/2021 0000   PROT 6.6 03/10/2021 1039   ALBUMIN 4.8 01/11/2017 1129   AST 29 03/10/2021 1039   ALT 27 03/10/2021 1039   ALKPHOS 224 (A) 04/22/2018 0000   BILITOT 0.9 03/10/2021 1039   GFRNONAA 81 03/18/2020 1003   GFRAA 94 03/18/2020 1003       Component Value Date/Time   WBC 7.9 03/10/2021 1039   RBC 4.75 03/10/2021 1039   HGB 14.2 03/10/2021 1039   HCT 42.8 03/10/2021 1039   PLT 196 03/10/2021 1039   MCV 90.1 03/10/2021 1039   MCH 29.9 03/10/2021 1039   MCHC 33.2 03/10/2021 1039   RDW 13.0 03/10/2021 1039    No results found for: "POCLITH", "LITHIUM"   No results found for: "PHENYTOIN", "PHENOBARB", "VALPROATE", "CBMZ"    .res Assessment: Plan:    Plan:  1. Adderall 20mg  TID  2. Seroquel 100mg  - 1 to 2 at hs  Needs a safety letter for DOT.  RTC 3 months  Monitor BP between visits while taking stimulant medication.  Time spent with patient was 20 minutes. Greater than 50% of face to face time with patient was spent on counseling and coordination of care.   Discussed potential metabolic side effects associated with atypical antipsychotics, as well as potential risk for movement side effects. Advised pt to contact office if movement side effects occur.   Discussed potential benefits, risks, and side effects of stimulants with patient to include increased heart rate, palpitations, insomnia, increased anxiety, increased irritability, or decreased appetite.  Instructed patient to contact office if experiencing any significant tolerability issues.  There are no diagnoses linked to this encounter.   Please see After Visit Summary for patient specific instructions.  Future Appointments  Date Time Provider [place]  09/09/2022 10:00 AM Serina Nichter, Berdie Ogren, NP CP-CP None    No orders of the defined types were placed in this encounter.     -------------------------------

## 2022-09-24 ENCOUNTER — Telehealth: Payer: Self-pay | Admitting: Adult Health

## 2022-09-24 ENCOUNTER — Other Ambulatory Visit: Payer: Self-pay

## 2022-09-24 DIAGNOSIS — F909 Attention-deficit hyperactivity disorder, unspecified type: Secondary | ICD-10-CM

## 2022-09-24 MED ORDER — AMPHETAMINE-DEXTROAMPHETAMINE 20 MG PO TABS: ORAL_TABLET | ORAL | 0 refills | Status: DC

## 2022-09-24 NOTE — Telephone Encounter (Signed)
Next visit is 12/11/22. Requesting refill on Adderall 20 mg #90 pills called to:  [facility], [place]   Phone: [phone]  Fax: [phone]    It is in stock.

## 2022-09-24 NOTE — Telephone Encounter (Signed)
Pended.

## 2022-10-15 ENCOUNTER — Encounter: Payer: Self-pay | Admitting: Family Medicine

## 2022-10-15 ENCOUNTER — Ambulatory Visit: Payer: BLUE CROSS/BLUE SHIELD | Admitting: Family Medicine

## 2022-10-15 VITALS — BP 136/84 | HR 65 | Ht 75.0 in | Wt 263.0 lb

## 2022-10-15 DIAGNOSIS — E119 Type 2 diabetes mellitus without complications: Secondary | ICD-10-CM

## 2022-10-15 DIAGNOSIS — I10 Essential (primary) hypertension: Secondary | ICD-10-CM | POA: Diagnosis not present

## 2022-10-15 DIAGNOSIS — Z125 Encounter for screening for malignant neoplasm of prostate: Secondary | ICD-10-CM | POA: Diagnosis not present

## 2022-10-15 LAB — POCT GLYCOSYLATED HEMOGLOBIN (HGB A1C): Hemoglobin A1C: 7 % — AB (ref 4.0–5.6)

## 2022-10-15 MED ORDER — JANUMET 50-1000 MG PO TABS: 1.0000 | ORAL_TABLET | Freq: Two times a day (BID) | ORAL | 1 refills | Status: DC

## 2022-10-15 NOTE — Assessment & Plan Note (Signed)
Well controlled. Continue current regimen. Follow up in  6 mo  
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
## 2022-10-15 NOTE — Assessment & Plan Note (Signed)
A1c is up to 7.0 today.  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  He does not want to take anything that is going to increase urination or cause hypoglycemia as he does have sometimes long periods where he has to drive without being able to stop to eat regularly. 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  We also discussed regular exercise.

## 2022-10-16 LAB — COMPLETE METABOLIC PANEL WITH GFR
AG Ratio: 1.8 (calc) (ref 1.0–2.5)
ALT: 48 U/L — ABNORMAL HIGH (ref 9–46)
AST: 36 U/L — ABNORMAL HIGH (ref 10–35)
Albumin: 4.6 g/dL (ref 3.6–5.1)
Alkaline phosphatase (APISO): 101 U/L (ref 35–144)
BUN/Creatinine Ratio: 14 (calc) (ref 6–22)
BUN: 18 mg/dL (ref 7–25)
CO2: 28 mmol/L (ref 20–32)
Calcium: 9.5 mg/dL (ref 8.6–10.3)
Chloride: 103 mmol/L (ref 98–110)
Creat: 1.31 mg/dL — ABNORMAL HIGH (ref 0.70–1.30)
Globulin: 2.6 g/dL (calc) (ref 1.9–3.7)
Glucose, Bld: 133 mg/dL — ABNORMAL HIGH (ref 65–99)
Potassium: 3.9 mmol/L (ref 3.5–5.3)
Sodium: 142 mmol/L (ref 135–146)
Total Bilirubin: 0.6 mg/dL (ref 0.2–1.2)
Total Protein: 7.2 g/dL (ref 6.1–8.1)
eGFR: 63 mL/min/{1.73_m2} (ref >=60)

## 2022-10-16 LAB — LIPID PANEL W/REFLEX DIRECT LDL
Cholesterol: 192 mg/dL (ref <200)
HDL: 39 mg/dL — ABNORMAL LOW (ref >=40)
Non-HDL Cholesterol (Calc): 153 mg/dL (calc) — ABNORMAL HIGH (ref <130)
Total CHOL/HDL Ratio: 4.9 (calc) (ref <5.0)
Triglycerides: 480 mg/dL — ABNORMAL HIGH (ref <150)

## 2022-10-16 LAB — CBC
HCT: 42.7 % (ref 38.5–50.0)
Hemoglobin: 14.7 g/dL (ref 13.2–17.1)
MCH: 31 pg (ref 27.0–33.0)
MCHC: 34.4 g/dL (ref 32.0–36.0)
MCV: 90.1 fL (ref 80.0–100.0)
MPV: 11.1 fL (ref 7.5–12.5)
Platelets: 203 10*3/uL (ref 140–400)
RBC: 4.74 10*6/uL (ref 4.20–5.80)
RDW: 12.6 % (ref 11.0–15.0)
WBC: 7.7 10*3/uL (ref 3.8–10.8)

## 2022-10-16 LAB — DIRECT LDL: Direct LDL: 107 mg/dL — ABNORMAL HIGH (ref <100)

## 2022-10-16 LAB — PSA: PSA: 0.43 ng/mL (ref <=4.00)

## 2022-10-16 NOTE — Progress Notes (Signed)
Hi Gary Stevens, your triglycerides were high and LDL was just slightly elevated.  Continue to work on healthy diet and regular exercise when you are not traveling.  Kidney function is stable right around 1.3.  Blood count and prostate test are normal.  Your liver enzymes are up.  This is probably from a little bit of inflammation in the liver so just encourage it to try to work on losing about 10 or 15 pounds that should help and just continuing to work on healthy food choices as best your can.  Also next time you go for your eye exam please let us know so that we can get your chart updated.

## 2022-10-26 ENCOUNTER — Telehealth: Payer: Self-pay | Admitting: Adult Health

## 2022-10-26 ENCOUNTER — Other Ambulatory Visit: Payer: Self-pay

## 2022-10-26 DIAGNOSIS — F909 Attention-deficit hyperactivity disorder, unspecified type: Secondary | ICD-10-CM

## 2022-10-26 MED ORDER — AMPHETAMINE-DEXTROAMPHETAMINE 20 MG PO TABS: 20.0000 mg | ORAL_TABLET | Freq: Three times a day (TID) | ORAL | 0 refills | Status: DC

## 2022-10-26 NOTE — Telephone Encounter (Signed)
Pended.

## 2022-10-26 NOTE — Telephone Encounter (Signed)
Pt asking to send today if possible Rx for generic adderall 20 mg 3/d to [facility]. APT [address]

## 2022-11-06 ENCOUNTER — Other Ambulatory Visit: Payer: Self-pay | Admitting: *Deleted

## 2022-11-06 DIAGNOSIS — I10 Essential (primary) hypertension: Secondary | ICD-10-CM

## 2022-11-06 MED ORDER — METOPROLOL SUCCINATE ER 100 MG PO TB24: 100.0000 mg | ORAL_TABLET | Freq: Every day | ORAL | 3 refills | Status: DC

## 2022-11-06 MED ORDER — OLMESARTAN-AMLODIPINE-HCTZ 40-5-25 MG PO TABS: 1.0000 | ORAL_TABLET | Freq: Every day | ORAL | 1 refills | Status: DC

## 2022-11-23 ENCOUNTER — Other Ambulatory Visit: Payer: Self-pay | Admitting: Adult Health

## 2022-11-23 ENCOUNTER — Telehealth: Payer: Self-pay | Admitting: Adult Health

## 2022-11-23 DIAGNOSIS — F909 Attention-deficit hyperactivity disorder, unspecified type: Secondary | ICD-10-CM

## 2022-11-23 MED ORDER — AMPHETAMINE-DEXTROAMPHETAMINE 20 MG PO TABS: 20.0000 mg | ORAL_TABLET | Freq: Three times a day (TID) | ORAL | 0 refills | Status: DC

## 2022-11-23 NOTE — Telephone Encounter (Signed)
Pt called and said that he needs a refill on his adderall 20 mg. Pharmacy is [facility] on [address] in [place], [place]. Next appt 04/23

## 2022-12-11 ENCOUNTER — Encounter: Payer: Self-pay | Admitting: Adult Health

## 2022-12-11 ENCOUNTER — Telehealth (INDEPENDENT_AMBULATORY_CARE_PROVIDER_SITE_OTHER): Payer: BLUE CROSS/BLUE SHIELD | Admitting: Adult Health

## 2022-12-11 DIAGNOSIS — G4700 Insomnia, unspecified: Secondary | ICD-10-CM

## 2022-12-11 DIAGNOSIS — F909 Attention-deficit hyperactivity disorder, unspecified type: Secondary | ICD-10-CM

## 2022-12-11 DIAGNOSIS — F331 Major depressive disorder, recurrent, moderate: Secondary | ICD-10-CM | POA: Diagnosis not present

## 2022-12-11 MED ORDER — QUETIAPINE FUMARATE 100 MG PO TABS: ORAL_TABLET | ORAL | 3 refills | Status: DC

## 2022-12-11 NOTE — Progress Notes (Signed)
Gary Stevens [id number] [birth year] 57 y.o.  Virtual Visit via Video Note  I connected with pt @ on 12/11/22 at  1:00 PM EDT by a video enabled telemedicine application and verified that I am speaking with the correct person using two identifiers.   I discussed the limitations of evaluation and management by telemedicine and the availability of in person appointments. The patient expressed understanding and agreed to proceed.  I discussed the assessment and treatment plan with the patient. The patient was provided an opportunity to ask questions and all were answered. The patient agreed with the plan and demonstrated an understanding of the instructions.   The patient was advised to call back or seek an in-person evaluation if the symptoms worsen or if the condition fails to improve as anticipated.  I provided 10 minutes of non-face-to-face time during this encounter.  The patient was located at home.  The provider was located at [facility].   Dorothyann Gibbs, NP   Subjective:   Patient ID:  Gary Stevens is a 57 y.o. (DOB 04/09/1966) male.  Chief Complaint: No chief complaint on file.   HPI Gary Stevens presents for follow-up of ADHD, insomnia and PTSD.  Describes mood today as "ok". Pleasant. Denies tearfulness. Mood symptoms - denies depression, anxiety and irritability. Mood is consistent. Stating "I'm doing well". Feels like medications continue to work well for him. He and wife doing well. Stable interest and motivation. Taking medications as prescribed. Energy levels stable. Active, does not have a regular exercise routine.  Enjoys some usual interests and activities. Married. Lives with wife. Mother and 3 sisters local.   Appetite adequate. Weight loss - diabetic. Sleeps well most nights. Averages 6 to 8 hours with Seroquel.  Focus and concentration stable with Adderall. Completing tasks. Managing aspects of household. 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      Denies SI or HI.  Denies AH or VH.  Denies substance use. Denies self harm.  Review of Systems:  Review of Systems  Musculoskeletal:  Negative for gait problem.  Neurological:  Negative for tremors.  Psychiatric/Behavioral:         Please refer to HPI    Medications: I have reviewed the patient's current medications.  Current Outpatient Medications  Medication Sig Dispense Refill   amphetamine-dextroamphetamine (ADDERALL) 20 MG tablet Take one tablet three times a day. 90 tablet 0   amphetamine-dextroamphetamine (ADDERALL) 20 MG tablet Take one tablet three times daily. 90 tablet 0   amphetamine-dextroamphetamine (ADDERALL) 20 MG tablet Take 1 tablet (20 mg total) by mouth 3 (three) times daily. 90 tablet 0   Blood Glucose Monitoring Suppl (ONE TOUCH ULTRA 2) w/Device KIT See admin instructions.  0   meloxicam (MOBIC) 15 MG tablet Take 1 tablet (15 mg total) by mouth daily. Take with food 15 tablet 0   metoprolol succinate (TOPROL-XL) 100 MG 24 hr tablet Take 1 tablet (100 mg total) by mouth daily. Take with or immediately following a meal. 90 tablet 3   Olmesartan-amLODIPine-HCTZ 40-5-25 MG TABS Take 1 tablet by mouth daily. 90 tablet 1   ONE TOUCH ULTRA TEST test strip Check blood sugar 4 times daily 300 each prn   pantoprazole (PROTONIX) 20 MG tablet Take 1 tablet (20 mg total) by mouth daily. 90 tablet 3   QUEtiapine (SEROQUEL) 100 MG tablet Take 1-2 tablets by mouth at bedtime 180 tablet 3   sitaGLIPtin-metformin (JANUMET) 50-1000 MG tablet Take 1 tablet by mouth 2 (  two) times daily with a meal. 180 tablet 1   No current facility-administered medications for this visit.    Medication Side Effects: None  Allergies:  Allergies  Allergen Reactions   Mirtazapine     Other reaction(s): Unknown    Past Medical History:  Diagnosis Date   ADHD 01/15/2009   Qualifier: Diagnosis of  By: Linford Arnold MD, Catherine     Anxiety state, unspecified 01/15/2009   Qualifier: Diagnosis of   By: Linford Arnold MD, Catherine     Essential hypertension, benign 06/06/2008   Qualifier: Diagnosis of  By: Linford Arnold MD, Consuello Masse 08/12/2009   Qualifier: Diagnosis of  By: Linford Arnold MD, Catherine     HYPERTROPHY PROSTATE W/UR OBST & OTH LUTS 01/15/2009   Qualifier: Diagnosis of  By: Linford Arnold MD, Damita Dunnings 06/06/2008   Qualifier: Diagnosis of  By: Linford Arnold MD, Catherine     KNEE PAIN 08/12/2009   Qualifier: Diagnosis of  By: Linford Arnold MD, Aurora Mask History  Problem Relation Age of Onset   Cirrhosis Father    Diabetes Mellitus II Father    Gallbladder disease Father    Gallbladder disease Sister    Gallbladder disease Son     Social History   Socioeconomic History   Marital status: Single    Spouse name: Not on file   Number of children: Not on file   Years of education: Not on file   Highest education level: Not on file  Occupational History   Not on file  Tobacco Use   Smoking status: Every Day    Packs/day: 1.00    Years: 30.00    Additional pack years: 0.00    Total pack years: 30.00    Types: Cigarettes   Smokeless tobacco: Never  Substance and Sexual Activity   Alcohol use: Not on file   Drug use: Not on file   Sexual activity: Not on file  Other Topics Concern   Not on file  Social History Narrative   Not on file   Social Determinants of Health   Financial Resource Strain: Not on file  Food Insecurity: Not on file  Transportation Needs: Not on file  Physical Activity: Not on file  Stress: Not on file  Social Connections: Not on file  Intimate Partner Violence: Not on file    Past Medical History, Surgical history, Social history, and Family history were reviewed and updated as appropriate.   Please see review of systems for further details on the patient's review from today.   Objective:   Physical Exam:  There were no vitals taken for this visit.  Physical Exam Constitutional:      General: He is  not in acute distress. Musculoskeletal:        General: No deformity.  Neurological:     Mental Status: He is alert and oriented to person, place, and time.     Coordination: Coordination normal.  Psychiatric:        Attention and Perception: Attention and perception normal. He does not perceive auditory or visual hallucinations.        Mood and Affect: Mood normal. Mood is not anxious or depressed. Affect is not labile, blunt, angry or inappropriate.        Speech: Speech normal.        Behavior: Behavior normal.        Thought Content: Thought content normal. Thought content is not paranoid or delusional.  Thought content does not include homicidal or suicidal ideation. Thought content does not include homicidal or suicidal plan.        Cognition and Memory: Cognition and memory normal.        Judgment: Judgment normal.     Comments: Insight intact     Lab Review:     Component Value Date/Time   NA 142 10/15/2022 0903   NA 137 04/22/2018 0000   K 3.9 10/15/2022 0903   CL 103 10/15/2022 0903   CO2 28 10/15/2022 0903   GLUCOSE 133 (H) 10/15/2022 0903   BUN 18 10/15/2022 0903   BUN 12 04/22/2018 0000   CREATININE 1.31 (H) 10/15/2022 0903   CALCIUM 9.5 10/15/2022 0903   PROT 7.2 10/15/2022 0903   ALBUMIN 4.8 01/11/2017 1129   AST 36 (H) 10/15/2022 0903   ALT 48 (H) 10/15/2022 0903   ALKPHOS 224 (A) 04/22/2018 0000   BILITOT 0.6 10/15/2022 0903   GFRNONAA 81 03/18/2020 1003   GFRAA 94 03/18/2020 1003       Component Value Date/Time   WBC 7.7 10/15/2022 0903   RBC 4.74 10/15/2022 0903   HGB 14.7 10/15/2022 0903   HCT 42.7 10/15/2022 0903   PLT 203 10/15/2022 0903   MCV 90.1 10/15/2022 0903   MCH 31.0 10/15/2022 0903   MCHC 34.4 10/15/2022 0903   RDW 12.6 10/15/2022 0903    No results found for: "POCLITH", "LITHIUM"   No results found for: "PHENYTOIN", "PHENOBARB", "VALPROATE", "CBMZ"   .res Assessment: Plan:    Plan:  Adderall  TID  Seroquel  - 1 to  2 at hs  RTC 3 months  Monitor BP between visits while taking stimulant medication.  Time spent with patient was 20 minutes. Greater than 50% of face to face time with patient was spent on counseling and coordination of care.   Discussed potential metabolic side effects associated with atypical antipsychotics, as well as potential risk for movement side effects. Advised pt to contact office if movement side effects occur.   Discussed potential benefits, risks, and side effects of stimulants with patient to include increased heart rate, palpitations, insomnia, increased anxiety, increased irritability, or decreased appetite.  Instructed patient to contact office if experiencing any significant tolerability issues.  There are no diagnoses linked to this encounter.   Please see After Visit Summary for patient specific instructions.  Future Appointments  Date Time Provider Department Center  12/11/2022  1:00 PM Diannia Hogenson, Thereasa Solo, NP CP-CP None  01/13/2023  8:10 AM Agapito Games, MD [facility] None    No orders of the defined types were placed in this encounter.     -------------------------------

## 2022-12-25 ENCOUNTER — Telehealth: Payer: Self-pay | Admitting: Adult Health

## 2022-12-25 ENCOUNTER — Other Ambulatory Visit: Payer: Self-pay | Admitting: Adult Health

## 2022-12-25 DIAGNOSIS — F909 Attention-deficit hyperactivity disorder, unspecified type: Secondary | ICD-10-CM

## 2022-12-25 MED ORDER — AMPHETAMINE-DEXTROAMPHETAMINE 20 MG PO TABS: ORAL_TABLET | ORAL | 0 refills | Status: DC

## 2022-12-25 NOTE — Telephone Encounter (Signed)
Script sent  

## 2022-12-25 NOTE — Telephone Encounter (Signed)
Next visit is 7/12. Requesting refill on Adderall 20 mg called to:  [id number] - [place], [place] - [address] AT [facility] OF [facility] [address]   [phone]   Per Nazaire it is in stock.

## 2023-01-13 ENCOUNTER — Ambulatory Visit: Payer: BLUE CROSS/BLUE SHIELD | Admitting: Family Medicine

## 2023-01-27 ENCOUNTER — Telehealth: Payer: Self-pay | Admitting: Adult Health

## 2023-01-27 ENCOUNTER — Other Ambulatory Visit: Payer: Self-pay | Admitting: Adult Health

## 2023-01-27 DIAGNOSIS — F909 Attention-deficit hyperactivity disorder, unspecified type: Secondary | ICD-10-CM

## 2023-01-27 MED ORDER — AMPHETAMINE-DEXTROAMPHETAMINE 20 MG PO TABS: ORAL_TABLET | ORAL | 0 refills | Status: DC

## 2023-01-27 NOTE — Telephone Encounter (Signed)
Script sent  

## 2023-01-27 NOTE — Telephone Encounter (Signed)
Patient called in for Adderall 20mg . Ph: [phone] Appt 7/12 Pharmacy [facility] [address] [place], [place]

## 2023-02-26 ENCOUNTER — Other Ambulatory Visit: Payer: Self-pay

## 2023-02-26 ENCOUNTER — Telehealth: Payer: Self-pay | Admitting: Adult Health

## 2023-02-26 DIAGNOSIS — F909 Attention-deficit hyperactivity disorder, unspecified type: Secondary | ICD-10-CM

## 2023-02-26 MED ORDER — AMPHETAMINE-DEXTROAMPHETAMINE 20 MG PO TABS: ORAL_TABLET | ORAL | 0 refills | Status: DC

## 2023-02-26 NOTE — Telephone Encounter (Signed)
There actually is already an Adderall on file that is okay to be filled today at the [facility] in [place].  He just needs to call the pharmacy and they should be able to refill the medication.  If for some reason they do not have it on file please let me know and I am happy to send.

## 2023-02-26 NOTE — Telephone Encounter (Signed)
Pt called asking for a refill on his adderall 20 mg. Pharmacy is [facility] in [place]

## 2023-02-26 NOTE — Telephone Encounter (Signed)
Pt called asking for a refill on his adderall 20 mg. Pharmacy is [facility] [place] Patient is unable to make appointment for July 2nd

## 2023-03-01 MED ORDER — AMOXICILLIN-POT CLAVULANATE 875-125 MG PO TABS: 1.0000 | ORAL_TABLET | Freq: Two times a day (BID) | ORAL | 0 refills | Status: DC

## 2023-03-01 NOTE — Telephone Encounter (Signed)
Abx already sent to correct pharmacy.

## 2023-03-01 NOTE — Telephone Encounter (Signed)
Kim, I will go ahead and send in an antibiotic for his dental issue.  We just need to know what pharmacy I am not sure what state he is in?  But it should be 10 days worth which should get him close to his dental appointment location pended so you should be able to just click at the pharmacy and hit send.  Thank you.Marland Kitchen

## 2023-03-01 NOTE — Addendum Note (Signed)
Addended by: Nani Gasser D on: 03/01/2023 05:08 PM   Modules accepted: Orders

## 2023-03-02 ENCOUNTER — Ambulatory Visit: Payer: BLUE CROSS/BLUE SHIELD | Admitting: Family Medicine

## 2023-03-08 ENCOUNTER — Encounter: Payer: Self-pay | Admitting: Family Medicine

## 2023-03-08 ENCOUNTER — Ambulatory Visit: Payer: BLUE CROSS/BLUE SHIELD | Admitting: Family Medicine

## 2023-03-08 VITALS — BP 128/70 | HR 70 | Ht 75.0 in | Wt 254.0 lb

## 2023-03-08 DIAGNOSIS — R202 Paresthesia of skin: Secondary | ICD-10-CM

## 2023-03-08 DIAGNOSIS — E118 Type 2 diabetes mellitus with unspecified complications: Secondary | ICD-10-CM

## 2023-03-08 DIAGNOSIS — I10 Essential (primary) hypertension: Secondary | ICD-10-CM

## 2023-03-08 DIAGNOSIS — Z23 Encounter for immunization: Secondary | ICD-10-CM

## 2023-03-08 DIAGNOSIS — E119 Type 2 diabetes mellitus without complications: Secondary | ICD-10-CM | POA: Diagnosis not present

## 2023-03-08 DIAGNOSIS — R200 Anesthesia of skin: Secondary | ICD-10-CM | POA: Diagnosis not present

## 2023-03-08 LAB — POCT GLYCOSYLATED HEMOGLOBIN (HGB A1C): Hemoglobin A1C: 6.3 % — AB (ref 4.0–5.6)

## 2023-03-08 NOTE — Progress Notes (Signed)
Established Patient Office Visit  Subjective   Patient ID: Gary Stevens, male    DOB: [birth year]  Age: 57 y.o. MRN: [id number]  Chief Complaint  Patient presents with   Diabetes   Hypertension    HPI  Diabetes - no hypoglycemic events. No wounds or sores that are not healing well. No increased thirst or urination. Checking glucose at home. Taking medications as prescribed without any side effects.  Last seen in February.  Hypertension- Pt denies chest pain, SOB, dizziness, or heart palpitations.  Taking meds as directed w/o problems.  Denies medication side effects.   Did end up seeing the dentist on Friday he had a tooth abscess.  He is feeling some better.  He also reports has been having some numbness and tingling particularly in his left arm from just a little bit above the elbow down to his hand he is not sure if it is coming from his neck.  But he has noticed that if he rests his left elbow on the side of the door it will trigger the numbness and tingling and usually he is able to let it rest down and it will eventually ease off.     ROS    Objective:     BP 128/70 (BP Location: Left Arm, Cuff Size: Large)   Pulse 70   Ht 6\' 3"  (1.905 m)   Wt 254 lb (115.2 kg)   SpO2 97%   BMI 31.75 kg/m    Physical Exam Constitutional:      Appearance: He is well-developed.  HENT:     Head: Normocephalic and atraumatic.  Cardiovascular:     Rate and Rhythm: Normal rate and regular rhythm.     Heart sounds: Normal heart sounds.  Pulmonary:     Effort: Pulmonary effort is normal.     Breath sounds: Normal breath sounds.  Skin:    General: Skin is warm and dry.  Neurological:     Mental Status: He is alert and oriented to person, place, and time.  Psychiatric:        Behavior: Behavior normal.      Results for orders placed or performed in visit on 03/08/23  POCT glycosylated hemoglobin (Hb A1C)  Result Value Ref Range   Hemoglobin A1C 6.3 (A) 4.0 - 5.6 %    HbA1c POC (<> result, manual entry)     HbA1c, POC (prediabetic range)     HbA1c, POC (controlled diabetic range)        The 10-year ASCVD risk score (Arnett DK, et al., 2019) is: 28.1%    Assessment & Plan:   Problem List Items Addressed This Visit       Cardiovascular and Mediastinum   Essential hypertension    BP at goal today.       Relevant Orders   POCT glycosylated hemoglobin (Hb A1C) (Completed)   BASIC METABOLIC PANEL WITH GFR     Endocrine   Controlled type 2 diabetes mellitus with complication, without long-term current use of insulin (HCC) - Primary    A1c looks phenomenal today at 6.3 it is down from previous of 7.0 he is really done great he is also down about 11 pounds is really changed he is been eating and doing to try to improve his glucose levels.      Other Visit Diagnoses     Encounter for immunization       Relevant Orders   Varicella-zoster vaccine IM (Completed)   Numbness and  tingling in left arm           Left forearm pain and angling-we did discuss that it is most likely secondary to nerve compression either coming from the cervical spine or at the elbow it sounds like it is definitely triggered when he puts pressure on his elbow so we discussed try to keep pressure off of it for the next couple weeks to see if it seems to resolve.  If not then we could also consider nerve conduction testing for further workup we will get him in with sports med or Ortho.   Return in about 4 months (around 07/09/2023) for Diabetes follow-up.    Nani Gasser, MD

## 2023-03-08 NOTE — Assessment & Plan Note (Signed)
A1c looks phenomenal today at 6.3 it is down from previous of 7.0 he is really done great he is also down about 11 pounds is really changed he is been eating and doing to try to improve his glucose levels.

## 2023-03-08 NOTE — Assessment & Plan Note (Signed)
BP at goal today.

## 2023-03-09 LAB — BASIC METABOLIC PANEL WITH GFR
BUN: 15 mg/dL (ref 7–25)
CO2: 30 mmol/L (ref 20–32)
Calcium: 9.5 mg/dL (ref 8.6–10.3)
Chloride: 104 mmol/L (ref 98–110)
Creat: 1.09 mg/dL (ref 0.70–1.30)
Glucose, Bld: 106 mg/dL — ABNORMAL HIGH (ref 65–99)
Potassium: 3.8 mmol/L (ref 3.5–5.3)
Sodium: 142 mmol/L (ref 135–146)
eGFR: 79 mL/min/{1.73_m2} (ref >=60)

## 2023-03-09 NOTE — Progress Notes (Signed)
Kidney function looks better this time.  Great work!!

## 2023-03-12 ENCOUNTER — Encounter: Payer: Self-pay | Admitting: Adult Health

## 2023-03-12 ENCOUNTER — Telehealth (INDEPENDENT_AMBULATORY_CARE_PROVIDER_SITE_OTHER): Payer: BLUE CROSS/BLUE SHIELD | Admitting: Adult Health

## 2023-03-12 DIAGNOSIS — F909 Attention-deficit hyperactivity disorder, unspecified type: Secondary | ICD-10-CM

## 2023-03-12 DIAGNOSIS — F4310 Post-traumatic stress disorder, unspecified: Secondary | ICD-10-CM

## 2023-03-12 DIAGNOSIS — G4700 Insomnia, unspecified: Secondary | ICD-10-CM

## 2023-03-12 NOTE — Progress Notes (Signed)
Gary Stevens [id number] [birth year] 57 y.o.  Virtual Visit via Video Note  I connected with pt @ on 03/12/23 at  4:20 PM EDT by a video enabled telemedicine application and verified that I am speaking with the correct person using two identifiers.   I discussed the limitations of evaluation and management by telemedicine and the availability of in person appointments. The patient expressed understanding and agreed to proceed.  I discussed the assessment and treatment plan with the patient. The patient was provided an opportunity to ask questions and all were answered. The patient agreed with the plan and demonstrated an understanding of the instructions.   The patient was advised to call back or seek an in-person evaluation if the symptoms worsen or if the condition fails to improve as anticipated.  I provided 15 minutes of non-face-to-face time during this encounter. The patient was located at home. The provider was located at [facility].   Dorothyann Gibbs, NP   Subjective:   Patient ID:  Gary Stevens is a 57 y.o. (DOB 12-17-1965) male.  Chief Complaint: No chief complaint on file.   HPI IRBIN KURMAN presents for follow-up of ADHD, insomnia and PTSD.  Describes mood today as "ok". Pleasant. Denies tearfulness. Mood symptoms - denies depression, anxiety and irritability. Mood is consistent. Stating "I'm doing ok". Feels like medications continue to work well. He and wife doing well. Stable interest and motivation. Taking medications as prescribed. Energy levels stable. Active, does not have a regular exercise routine.  Enjoys some usual interests and activities. Married. Lives with wife. Mother and 3 sisters local.   Appetite adequate. Weight loss - 9 pounds - 254 pounds - diabetic. Sleeps well most nights. Averages 6 to 8 hours with Seroquel.  Focus and concentration stable with Adderall. Completing tasks. Managing aspects of household. Work going well - OTR  [employer].     Denies SI or HI.  Denies AH or VH.  Denies substance use. Denies self harm.  Review of Systems:  Review of Systems  Musculoskeletal:  Negative for gait problem.  Neurological:  Negative for tremors.  Psychiatric/Behavioral:         Please refer to HPI   Medications: I have reviewed the patient's current medications.  Current Outpatient Medications  Medication Sig Dispense Refill   amphetamine-dextroamphetamine (ADDERALL) 20 MG tablet Take 1 tablet (20 mg total) by mouth 3 (three) times daily. 90 tablet 0   amphetamine-dextroamphetamine (ADDERALL) 20 MG tablet Take one tablet three times a day. 90 tablet 0   amphetamine-dextroamphetamine (ADDERALL) 20 MG tablet Take one tablet three times daily. 90 tablet 0   Blood Glucose Monitoring Suppl (ONE TOUCH ULTRA 2) w/Device KIT See admin instructions.  0   meloxicam (MOBIC) 15 MG tablet Take 1 tablet (15 mg total) by mouth daily. Take with food 15 tablet 0   metoprolol succinate (TOPROL-XL) 100 MG 24 hr tablet Take 1 tablet (100 mg total) by mouth daily. Take with or immediately following a meal. 90 tablet 3   Olmesartan-amLODIPine-HCTZ 40-5-25 MG TABS Take 1 tablet by mouth daily. 90 tablet 1   ONE TOUCH ULTRA TEST test strip Check blood sugar 4 times daily 300 each prn   pantoprazole (PROTONIX) 20 MG tablet Take 1 tablet (20 mg total) by mouth daily. 90 tablet 3   QUEtiapine (SEROQUEL) 100 MG tablet Take 1-2 tablets by mouth at bedtime 180 tablet 3   sitaGLIPtin-metformin (JANUMET) 50-1000 MG tablet Take 1 tablet by mouth  2 (two) times daily with a meal. 180 tablet 1   No current facility-administered medications for this visit.    Medication Side Effects: None  Allergies:  Allergies  Allergen Reactions   Mirtazapine     Other reaction(s): Unknown    Past Medical History:  Diagnosis Date   ADHD 01/15/2009   Qualifier: Diagnosis of  By: Linford Arnold MD, Catherine     Anxiety state, unspecified 01/15/2009    Qualifier: Diagnosis of  By: Linford Arnold MD, Catherine     Essential hypertension, benign 06/06/2008   Qualifier: Diagnosis of  By: Linford Arnold MD, Consuello Masse 08/12/2009   Qualifier: Diagnosis of  By: Linford Arnold MD, Catherine     HYPERTROPHY PROSTATE W/UR OBST & OTH LUTS 01/15/2009   Qualifier: Diagnosis of  By: Linford Arnold MD, Damita Dunnings 06/06/2008   Qualifier: Diagnosis of  By: Linford Arnold MD, Catherine     KNEE PAIN 08/12/2009   Qualifier: Diagnosis of  By: Linford Arnold MD, Catherine     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    Family History  Problem Relation Age of Onset   Cirrhosis Father    Diabetes Mellitus II Father    Gallbladder disease Father    Gallbladder disease Sister    Gallbladder disease Son     Social History   Socioeconomic History   Marital status: Married    Spouse name: Not on file   Number of children: Not on file   Years of education: Not on file   Highest education level: Not on file  Occupational History   Not on file  Tobacco Use   Smoking status: Every Day    Current packs/day: 1.00    Average packs/day: 1 pack/day for 30.0 years (30.0 ttl pk-yrs)    Types: Cigarettes   Smokeless tobacco: Never  Substance and Sexual Activity   Alcohol use: Not on file   Drug use: Not on file   Sexual activity: Not on file  Other Topics Concern   Not on file  Social History Narrative   Not on file   Social Determinants of Health   Financial Resource Strain: Not on file  Food Insecurity: Not on file  Transportation Needs: Not on file  Physical Activity: Not on file  Stress: Not on file  Social Connections: Not on file  Intimate Partner Violence: Not on file    Past Medical History, Surgical history, Social history, and Family history were reviewed and updated as appropriate.   Please see review of systems for further details on the patient's review from today.   Objective:   Physical Exam:  There were no vitals taken  for this visit.  Physical Exam Constitutional:      General: He is not in acute distress. Musculoskeletal:        General: No deformity.  Neurological:     Mental Status: He is alert and oriented to person, place, and time.     Coordination: Coordination normal.  Psychiatric:        Attention and Perception: Attention and perception normal. He does not perceive auditory or visual hallucinations.        Mood and Affect: Affect is not labile, blunt, angry or inappropriate.        Speech: Speech normal.        Behavior: Behavior normal.        Thought Content: Thought content normal. Thought content is not paranoid or delusional. Thought content does not include  homicidal or suicidal ideation. Thought content does not include homicidal or suicidal plan.        Cognition and Memory: Cognition and memory normal.        Judgment: Judgment normal.     Comments: Insight intact     Lab Review:     Component Value Date/Time   NA 142 03/08/2023 1418   NA 137 04/22/2018 0000   K 3.8 03/08/2023 1418   CL 104 03/08/2023 1418   CO2 30 03/08/2023 1418   GLUCOSE 106 (H) 03/08/2023 1418   BUN 15 03/08/2023 1418   BUN 12 04/22/2018 0000   CREATININE 1.09 03/08/2023 1418   CALCIUM 9.5 03/08/2023 1418   PROT 7.2 10/15/2022 0903   ALBUMIN 4.8 01/11/2017 1129   AST 36 (H) 10/15/2022 0903   ALT 48 (H) 10/15/2022 0903   ALKPHOS 224 (A) 04/22/2018 0000   BILITOT 0.6 10/15/2022 0903   GFRNONAA 81 03/18/2020 1003   GFRAA 94 03/18/2020 1003       Component Value Date/Time   WBC 7.7 10/15/2022 0903   RBC 4.74 10/15/2022 0903   HGB 14.7 10/15/2022 0903   HCT 42.7 10/15/2022 0903   PLT 203 10/15/2022 0903   MCV 90.1 10/15/2022 0903   MCH 31.0 10/15/2022 0903   MCHC 34.4 10/15/2022 0903   RDW 12.6 10/15/2022 0903    No results found for: "POCLITH", "LITHIUM"   No results found for: "PHENYTOIN", "PHENOBARB", "VALPROATE", "CBMZ"   .res Assessment: Plan:    Plan:  Adderall 20mg  TID   Seroquel 100mg  - 1 to 2 at hs  RTC 3 months  Monitor BP between visits while taking stimulant medication.  Time spent with patient was 20 minutes. Greater than 50% of face to face time with patient was spent on counseling and coordination of care.   Discussed potential metabolic side effects associated with atypical antipsychotics, as well as potential risk for movement side effects. Advised pt to contact office if movement side effects occur.   Discussed potential benefits, risks, and side effects of stimulants with patient to include increased heart rate, palpitations, insomnia, increased anxiety, increased irritability, or decreased appetite.  Instructed patient to contact office if experiencing any significant tolerability issues.  Diagnoses and all orders for this visit:  Attention deficit hyperactivity disorder (ADHD), unspecified ADHD type  Insomnia, unspecified type  PTSD (post-traumatic stress disorder)     Please see After Visit Summary for patient specific instructions.  Future Appointments  Date Time Provider Department Center  07/13/2023  2:20 PM Agapito Games, MD [facility] None    No orders of the defined types were placed in this encounter.     -------------------------------

## 2023-03-26 ENCOUNTER — Telehealth: Payer: Self-pay | Admitting: Family Medicine

## 2023-03-26 NOTE — Telephone Encounter (Signed)
Call patient remind him to schedule a diabetic eye exam.  Or we can even get him in at her next [facility] retinopathy screening here in the office.

## 2023-03-30 ENCOUNTER — Other Ambulatory Visit: Payer: Self-pay

## 2023-03-30 ENCOUNTER — Telehealth: Payer: Self-pay | Admitting: Adult Health

## 2023-03-30 DIAGNOSIS — F909 Attention-deficit hyperactivity disorder, unspecified type: Secondary | ICD-10-CM

## 2023-03-30 MED ORDER — AMPHETAMINE-DEXTROAMPHETAMINE 20 MG PO TABS
20.0000 mg | ORAL_TABLET | Freq: Three times a day (TID) | ORAL | 0 refills | Status: DC
Start: 2023-03-30 — End: 2023-07-01

## 2023-03-30 NOTE — Telephone Encounter (Signed)
Pt scheduled his next appt in October. He needs a refill on his adderall 20 mg. The pharmacy is [facility] on [address] in [place]

## 2023-03-30 NOTE — Telephone Encounter (Signed)
Pended.

## 2023-04-30 ENCOUNTER — Telehealth: Payer: Self-pay | Admitting: Adult Health

## 2023-04-30 ENCOUNTER — Other Ambulatory Visit: Payer: Self-pay

## 2023-04-30 DIAGNOSIS — F909 Attention-deficit hyperactivity disorder, unspecified type: Secondary | ICD-10-CM

## 2023-04-30 MED ORDER — AMPHETAMINE-DEXTROAMPHETAMINE 20 MG PO TABS
ORAL_TABLET | ORAL | 0 refills | Status: DC
Start: 2023-04-30 — End: 2023-09-15

## 2023-04-30 NOTE — Telephone Encounter (Signed)
Pended.

## 2023-04-30 NOTE — Telephone Encounter (Signed)
Pt called at 10:52a requesting a refill of Adderall to   [id number] - [place], [place] - [address] AT [facility] OF [facility] [address] [place] [address] Phone: [phone]  Fax: [phone]    Next appt 10/11

## 2023-05-17 ENCOUNTER — Telehealth: Payer: Self-pay | Admitting: Family Medicine

## 2023-05-17 DIAGNOSIS — I10 Essential (primary) hypertension: Secondary | ICD-10-CM

## 2023-05-17 MED ORDER — OLMESARTAN-AMLODIPINE-HCTZ 40-5-25 MG PO TABS
1.0000 | ORAL_TABLET | Freq: Every day | ORAL | 1 refills | Status: DC
Start: 2023-05-17 — End: 2023-11-23

## 2023-05-17 MED ORDER — METOPROLOL SUCCINATE ER 100 MG PO TB24
100.0000 mg | ORAL_TABLET | Freq: Every day | ORAL | 0 refills | Status: DC
Start: 2023-05-17 — End: 2023-11-23

## 2023-05-17 NOTE — Telephone Encounter (Signed)
Medications have been sent to requested pharmacy

## 2023-05-17 NOTE — Telephone Encounter (Signed)
Patient called requesting a refill of ; Olmesartan-amLODIPine-HCTZ 40-5-25 MG TABS [[id number]] and metoprolol succinate (TOPROL-XL) 100 MG 24 hr tablet [[id number]]  Pharmacy ;   [facility] #[id number] - [place], [place] - [address]

## 2023-05-31 ENCOUNTER — Telehealth: Payer: Self-pay | Admitting: Adult Health

## 2023-05-31 ENCOUNTER — Other Ambulatory Visit: Payer: Self-pay

## 2023-05-31 DIAGNOSIS — F909 Attention-deficit hyperactivity disorder, unspecified type: Secondary | ICD-10-CM

## 2023-05-31 NOTE — Telephone Encounter (Signed)
Pended.

## 2023-05-31 NOTE — Telephone Encounter (Signed)
LF 08/30/; LV 07/12; NV 06/11/23

## 2023-05-31 NOTE — Telephone Encounter (Signed)
Gary Stevens called at 11:09 to request refill of his Adderall.  Appt 10/11.  Send to [facility] #[id number] - [place], [place] - [address] AT [facility] OF [address]

## 2023-05-31 NOTE — Telephone Encounter (Signed)
Please verify last RF of his Adderall

## 2023-06-01 MED ORDER — AMPHETAMINE-DEXTROAMPHETAMINE 20 MG PO TABS
ORAL_TABLET | ORAL | 0 refills | Status: DC
Start: 2023-06-01 — End: 2023-08-02

## 2023-06-09 ENCOUNTER — Other Ambulatory Visit: Payer: Self-pay | Admitting: Adult Health

## 2023-06-09 DIAGNOSIS — G4700 Insomnia, unspecified: Secondary | ICD-10-CM

## 2023-06-09 DIAGNOSIS — F331 Major depressive disorder, recurrent, moderate: Secondary | ICD-10-CM

## 2023-06-10 ENCOUNTER — Telehealth: Payer: Self-pay | Admitting: Family Medicine

## 2023-06-10 DIAGNOSIS — E119 Type 2 diabetes mellitus without complications: Secondary | ICD-10-CM

## 2023-06-10 MED ORDER — JANUMET 50-1000 MG PO TABS
1.0000 | ORAL_TABLET | Freq: Two times a day (BID) | ORAL | 1 refills | Status: DC
Start: 2023-06-10 — End: 2023-09-14

## 2023-06-10 NOTE — Telephone Encounter (Signed)
Pt advised.

## 2023-06-10 NOTE — Telephone Encounter (Signed)
Has appt. tomorrow

## 2023-06-10 NOTE — Telephone Encounter (Signed)
Prescription Request  06/10/2023  LOV: 03/08/2023  What is the name of the medication or equipment? JAMUMET 50-1000 MG   Have you contacted your pharmacy to request a refill? No   Which pharmacy would you like this sent to? [facility] [place] phone number is [phone]   Patient notified that their request is being sent to the clinical staff for review and that they should receive a response within 2 business days.   Please advise at Mobile [phone] (mobile)

## 2023-06-11 ENCOUNTER — Telehealth: Payer: BLUE CROSS/BLUE SHIELD | Admitting: Adult Health

## 2023-06-11 ENCOUNTER — Encounter: Payer: Self-pay | Admitting: Adult Health

## 2023-06-11 DIAGNOSIS — F909 Attention-deficit hyperactivity disorder, unspecified type: Secondary | ICD-10-CM

## 2023-06-11 DIAGNOSIS — G4700 Insomnia, unspecified: Secondary | ICD-10-CM

## 2023-06-11 DIAGNOSIS — F4310 Post-traumatic stress disorder, unspecified: Secondary | ICD-10-CM

## 2023-06-11 MED ORDER — QUETIAPINE FUMARATE 100 MG PO TABS
ORAL_TABLET | ORAL | 3 refills | Status: DC
Start: 2023-06-11 — End: 2024-06-28

## 2023-06-11 NOTE — Progress Notes (Signed)
Gary Stevens [id number] 1966-08-15 57 y.o.  Virtual Visit via Video Note  I connected with pt @ on 06/11/23 at  3:00 PM EDT by a video enabled telemedicine application and verified that I am speaking with the correct person using two identifiers.   I discussed the limitations of evaluation and management by telemedicine and the availability of in person appointments. The patient expressed understanding and agreed to proceed.  I discussed the assessment and treatment plan with the patient. The patient was provided an opportunity to ask questions and all were answered. The patient agreed with the plan and demonstrated an understanding of the instructions.   The patient was advised to call back or seek an in-person evaluation if the symptoms worsen or if the condition fails to improve as anticipated.  I provided 15 minutes of non-face-to-face time during this encounter.  The patient was located at home.  The provider was located at [facility].   Dorothyann Gibbs, NP   Subjective:   Patient ID:  Gary Stevens is a 57 y.o. (DOB [birth year]) male.  Chief Complaint: No chief complaint on file.   HPI Gary Stevens presents for follow-up of ADHD, insomnia and PTSD.  Describes mood today as "ok". Pleasant. Denies tearfulness. Mood symptoms - denies depression, anxiety and irritability. Denies panic attacks. Denies worry, rumination and over thinking. Mood is consistent. Stating "I feel like I'm doing aright". Feels like medications continue to work well. He and wife doing well. Stable interest and motivation. Taking medications as prescribed. Energy levels stable. Active, does not have a regular exercise routine.  Enjoys some usual interests and activities. Married. Lives with wife. Mother and 3 sisters local.   Appetite fluctuates. Weight loss - A1C improving. Sleeps well most nights. Averages 6 to 8 hours with Seroquel.  Focus and concentration stable with Adderall.  Completing tasks. Managing aspects of household. 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    Denies SI or HI.  Denies AH or VH.  Denies substance use. Denies self harm.   Review of Systems:  Review of Systems  Musculoskeletal:  Negative for gait problem.  Neurological:  Negative for tremors.  Psychiatric/Behavioral:         Please refer to HPI    Medications: I have reviewed the patient's current medications.  Current Outpatient Medications  Medication Sig Dispense Refill   amphetamine-dextroamphetamine (ADDERALL) 20 MG tablet Take 1 tablet (20 mg total) by mouth 3 (three) times daily. 90 tablet 0   amphetamine-dextroamphetamine (ADDERALL) 20 MG tablet Take one tablet three times a day. 90 tablet 0   amphetamine-dextroamphetamine (ADDERALL) 20 MG tablet Take one tablet three times daily. 90 tablet 0   Blood Glucose Monitoring Suppl (ONE TOUCH ULTRA 2) w/Device KIT See admin instructions.  0   meloxicam (MOBIC) 15 MG tablet Take 1 tablet (15 mg total) by mouth daily. Take with food 15 tablet 0   metoprolol succinate (TOPROL-XL) 100 MG 24 hr tablet Take 1 tablet (100 mg total) by mouth daily. Take with or immediately following a meal. 90 tablet 0   Olmesartan-amLODIPine-HCTZ 40-5-25 MG TABS Take 1 tablet by mouth daily. 90 tablet 1   ONE TOUCH ULTRA TEST test strip Check blood sugar 4 times daily 300 each prn   pantoprazole (PROTONIX) 20 MG tablet Take 1 tablet (20 mg total) by mouth daily. 90 tablet 3   QUEtiapine (SEROQUEL) 100 MG tablet Take 1-2 tablets by mouth at bedtime 180 tablet 3  sitaGLIPtin-metformin (JANUMET) 50-1000 MG tablet Take 1 tablet by mouth 2 (two) times daily with a meal. 180 tablet 1   No current facility-administered medications for this visit.    Medication Side Effects: None  Allergies:  Allergies  Allergen Reactions   Mirtazapine     Other reaction(s): Unknown    Past Medical History:  Diagnosis Date   ADHD 01/15/2009   Qualifier: Diagnosis of  By:  Linford Arnold MD, Catherine     Anxiety state, unspecified 01/15/2009   Qualifier: Diagnosis of  By: Linford Arnold MD, Catherine     Essential hypertension, benign 06/06/2008   Qualifier: Diagnosis of  By: Linford Arnold MD, Consuello Masse 08/12/2009   Qualifier: Diagnosis of  By: Linford Arnold MD, Catherine     HYPERTROPHY PROSTATE W/UR OBST & OTH LUTS 01/15/2009   Qualifier: Diagnosis of  By: Linford Arnold MD, Damita Dunnings 06/06/2008   Qualifier: Diagnosis of  By: Linford Arnold MD, Catherine     KNEE PAIN 08/12/2009   Qualifier: Diagnosis of  By: Linford Arnold MD, Catherine     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    Family History  Problem Relation Age of Onset   Cirrhosis Father    Diabetes Mellitus II Father    Gallbladder disease Father    Gallbladder disease Sister    Gallbladder disease Son     Social History   Socioeconomic History   Marital status: Married    Spouse name: Not on file   Number of children: Not on file   Years of education: Not on file   Highest education level: Not on file  Occupational History   Not on file  Tobacco Use   Smoking status: Every Day    Current packs/day: 1.00    Average packs/day: 1 pack/day for 30.0 years (30.0 ttl pk-yrs)    Types: Cigarettes   Smokeless tobacco: Never  Substance and Sexual Activity   Alcohol use: Not on file   Drug use: Not on file   Sexual activity: Not on file  Other Topics Concern   Not on file  Social History Narrative   Not on file   Social Determinants of Health   Financial Resource Strain: Not on file  Food Insecurity: Not on file  Transportation Needs: Not on file  Physical Activity: Not on file  Stress: Not on file  Social Connections: Not on file  Intimate Partner Violence: Not on file    Past Medical History, Surgical history, Social history, and Family history were reviewed and updated as appropriate.   Please see review of systems for further details on the patient's review from  today.   Objective:   Physical Exam:  There were no vitals taken for this visit.  Physical Exam Constitutional:      General: He is not in acute distress. Musculoskeletal:        General: No deformity.  Neurological:     Mental Status: He is alert and oriented to person, place, and time.     Coordination: Coordination normal.  Psychiatric:        Attention and Perception: Attention and perception normal. He does not perceive auditory or visual hallucinations.        Mood and Affect: Affect is not labile, blunt, angry or inappropriate.        Speech: Speech normal.        Behavior: Behavior normal.        Thought Content: Thought content normal. Thought content  is not paranoid or delusional. Thought content does not include homicidal or suicidal ideation. Thought content does not include homicidal or suicidal plan.        Cognition and Memory: Cognition and memory normal.        Judgment: Judgment normal.     Comments: Insight intact     Lab Review:     Component Value Date/Time   NA 142 03/08/2023 1418   NA 137 04/22/2018 0000   K 3.8 03/08/2023 1418   CL 104 03/08/2023 1418   CO2 30 03/08/2023 1418   GLUCOSE 106 (H) 03/08/2023 1418   BUN 15 03/08/2023 1418   BUN 12 04/22/2018 0000   CREATININE 1.09 03/08/2023 1418   CALCIUM 9.5 03/08/2023 1418   PROT 7.2 10/15/2022 0903   ALBUMIN 4.8 01/11/2017 1129   AST 36 (H) 10/15/2022 0903   ALT 48 (H) 10/15/2022 0903   ALKPHOS 224 (A) 04/22/2018 0000   BILITOT 0.6 10/15/2022 0903   GFRNONAA 81 03/18/2020 1003   GFRAA 94 03/18/2020 1003       Component Value Date/Time   WBC 7.7 10/15/2022 0903   RBC 4.74 10/15/2022 0903   HGB 14.7 10/15/2022 0903   HCT 42.7 10/15/2022 0903   PLT 203 10/15/2022 0903   MCV 90.1 10/15/2022 0903   MCH 31.0 10/15/2022 0903   MCHC 34.4 10/15/2022 0903   RDW 12.6 10/15/2022 0903    No results found for: "POCLITH", "LITHIUM"   No results found for: "PHENYTOIN", "PHENOBARB", "VALPROATE",  "CBMZ"   .res Assessment: Plan:    Plan:  Adderall 20mg  TID  Seroquel 100mg  - 1 to 2 at hs - sent a year supply.  RTC 3 months  Monitor BP between visits while taking stimulant medication.  Time spent with patient was 20 minutes. Greater than 50% of face to face time with patient was spent on counseling and coordination of care.   Discussed potential metabolic side effects associated with atypical antipsychotics, as well as potential risk for movement side effects. Advised pt to contact office if movement side effects occur.   Discussed potential benefits, risks, and side effects of stimulants with patient to include increased heart rate, palpitations, insomnia, increased anxiety, increased irritability, or decreased appetite.  Instructed patient to contact office if experiencing any significant tolerability issues.  There are no diagnoses linked to this encounter.   Please see After Visit Summary for patient specific instructions.  Future Appointments  Date Time Provider Department Center  07/13/2023  2:20 PM Agapito Games, MD [facility] None    No orders of the defined types were placed in this encounter.     -------------------------------

## 2023-07-01 ENCOUNTER — Other Ambulatory Visit: Payer: Self-pay

## 2023-07-01 ENCOUNTER — Telehealth: Payer: Self-pay | Admitting: Adult Health

## 2023-07-01 DIAGNOSIS — F909 Attention-deficit hyperactivity disorder, unspecified type: Secondary | ICD-10-CM

## 2023-07-01 MED ORDER — AMPHETAMINE-DEXTROAMPHETAMINE 20 MG PO TABS
20.0000 mg | ORAL_TABLET | Freq: Three times a day (TID) | ORAL | 0 refills | Status: DC
Start: 2023-07-01 — End: 2023-08-02

## 2023-07-01 NOTE — Telephone Encounter (Signed)
Pt called asking for a refill on his adderall 20 mg. Pharmacy is [facility] located at [address], [place], 

## 2023-07-01 NOTE — Telephone Encounter (Signed)
Pended.

## 2023-07-13 ENCOUNTER — Ambulatory Visit: Payer: BLUE CROSS/BLUE SHIELD | Admitting: Family Medicine

## 2023-08-02 ENCOUNTER — Telehealth: Payer: Self-pay | Admitting: Adult Health

## 2023-08-02 ENCOUNTER — Other Ambulatory Visit: Payer: Self-pay

## 2023-08-02 DIAGNOSIS — F909 Attention-deficit hyperactivity disorder, unspecified type: Secondary | ICD-10-CM

## 2023-08-02 MED ORDER — AMPHETAMINE-DEXTROAMPHETAMINE 20 MG PO TABS: ORAL_TABLET | ORAL | 0 refills | Status: DC

## 2023-08-02 MED ORDER — AMPHETAMINE-DEXTROAMPHETAMINE 20 MG PO TABS: 20.0000 mg | ORAL_TABLET | Freq: Three times a day (TID) | ORAL | 0 refills | Status: DC

## 2023-08-02 NOTE — Telephone Encounter (Signed)
Pended adderall 20 mg (2 rx's) to requested pharmacy.

## 2023-08-02 NOTE — Telephone Encounter (Signed)
Rashee called at 9:10 to request refill of his Adderall.  Appt 09/15/23.  Send to [id number] - [place], [place] - [address]

## 2023-08-09 ENCOUNTER — Ambulatory Visit: Payer: BLUE CROSS/BLUE SHIELD | Admitting: Family Medicine

## 2023-08-17 DIAGNOSIS — H9211 Otorrhea, right ear: Secondary | ICD-10-CM | POA: Diagnosis not present

## 2023-09-08 ENCOUNTER — Telehealth: Payer: Self-pay | Admitting: Family Medicine

## 2023-09-08 DIAGNOSIS — E119 Type 2 diabetes mellitus without complications: Secondary | ICD-10-CM

## 2023-09-08 DIAGNOSIS — Z1211 Encounter for screening for malignant neoplasm of colon: Secondary | ICD-10-CM

## 2023-09-08 NOTE — Telephone Encounter (Signed)
 Please call patient and encouraged him to consider doing Cologuard for colon cancer screening it is a box kit that we get sent to his house and he gets a specimen and then drops it off he could do it on a day where he is home.  If the test is negative then he is good for 3 years but would really really like him to have some form of colon cancer screening.  Also have him to schedule a diabetes follow-up appointment.  We have not seen him in a while and we need to check his labs and make sure that everything is up-to-date.

## 2023-09-14 MED ORDER — JANUMET 50-1000 MG PO TABS: 1.0000 | ORAL_TABLET | Freq: Two times a day (BID) | ORAL | 1 refills | Status: DC

## 2023-09-14 NOTE — Telephone Encounter (Signed)
 Spoke w/pt he reports that he is really overwhelmed right now with new insurance. He provided the following information for his new insurance.   [employer]   Member PI#[id number]  Group# [id number]  Rx bin# [id number]  Pcn#  [id number]  Pt is asking for refill/PA for Janumet  he will now [facility]

## 2023-09-15 ENCOUNTER — Encounter: Payer: Self-pay | Admitting: Adult Health

## 2023-09-15 ENCOUNTER — Telehealth: Payer: Self-pay

## 2023-09-15 ENCOUNTER — Telehealth: Payer: 59 | Admitting: Adult Health

## 2023-09-15 DIAGNOSIS — F909 Attention-deficit hyperactivity disorder, unspecified type: Secondary | ICD-10-CM

## 2023-09-15 DIAGNOSIS — G4700 Insomnia, unspecified: Secondary | ICD-10-CM

## 2023-09-15 DIAGNOSIS — F4310 Post-traumatic stress disorder, unspecified: Secondary | ICD-10-CM

## 2023-09-15 MED ORDER — AMPHETAMINE-DEXTROAMPHETAMINE 20 MG PO TABS: ORAL_TABLET | ORAL | 0 refills | Status: DC

## 2023-09-15 NOTE — Progress Notes (Signed)
 Gary Stevens [id number] Oct 25, 1965 58 y.o.  Virtual Visit via Video Note  I connected with pt @ on 09/15/23 at  4:00 PM EST by a video enabled telemedicine application and verified that I am speaking with the correct person using two identifiers.   I discussed the limitations of evaluation and management by telemedicine and the availability of in person appointments. The patient expressed understanding and agreed to proceed.  I discussed the assessment and treatment plan with the patient. The patient was provided an opportunity to ask questions and all were answered. The patient agreed with the plan and demonstrated an understanding of the instructions.   The patient was advised to call back or seek an in-person evaluation if the symptoms worsen or if the condition fails to improve as anticipated.  I provided 10 minutes of non-face-to-face time during this encounter.  The patient was located at home.  The provider was located at [facility].   Reagan Camera, NP   Subjective:   Patient ID:  Gary Stevens is a 58 y.o. (DOB [birth year]) male.  Chief Complaint: No chief complaint on file.   HPI Gary Stevens presents for follow-up of ADHD, insomnia and PTSD.  Describes mood today as "ok". Pleasant. Denies tearfulness. Mood symptoms - denies depression, anxiety and irritability. Reports stable interest and motivation. Denies panic attacks. Denies worry, rumination and over thinking. Mood is consistent. Stating "everything is going pretty good". Feels like current medications work well. Taking medications as prescribed. Energy levels stable. Active, does not have a regular exercise routine.  Enjoys some usual interests and activities. Married. Lives with wife. Mother and 3 sisters local.   Appetite fluctuates. Weight loss - A1C improving. Sleeps well most nights. Averages 6 to 8 hours with Seroquel .  Focus and concentration stable with Adderall. Completing tasks.  Managing aspects of household. 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    Denies SI or HI.  Denies AH or VH.  Denies substance use. Denies self harm.   Review of Systems:  Review of Systems  Musculoskeletal:  Negative for gait problem.  Neurological:  Negative for tremors.  Psychiatric/Behavioral:         Please refer to HPI    Medications: I have reviewed the patient's current medications.  Current Outpatient Medications  Medication Sig Dispense Refill   amphetamine -dextroamphetamine  (ADDERALL) 20 MG tablet Take one tablet three times a day. 90 tablet 0   amphetamine -dextroamphetamine  (ADDERALL) 20 MG tablet Take 1 tablet (20 mg total) by mouth 3 (three) times daily. 90 tablet 0   amphetamine -dextroamphetamine  (ADDERALL) 20 MG tablet Take one tablet three times daily. 90 tablet 0   Blood Glucose Monitoring Suppl (ONE TOUCH ULTRA 2) w/Device KIT See admin instructions.  0   meloxicam  (MOBIC ) 15 MG tablet Take 1 tablet (15 mg total) by mouth daily. Take with food 15 tablet 0   metoprolol  succinate (TOPROL -XL) 100 MG 24 hr tablet Take 1 tablet (100 mg total) by mouth daily. Take with or immediately following a meal. 90 tablet 0   Olmesartan -amLODIPine -HCTZ 40-5-25 MG TABS Take 1 tablet by mouth daily. 90 tablet 1   ONE TOUCH ULTRA TEST test strip Check blood sugar 4 times daily 300 each prn   pantoprazole  (PROTONIX ) 20 MG tablet Take 1 tablet (20 mg total) by mouth daily. 90 tablet 3   QUEtiapine  (SEROQUEL ) 100 MG tablet Take 1-2 tablets by mouth at bedtime 180 tablet 3   sitaGLIPtin -metformin  (JANUMET ) 50-1000 MG tablet Take  1 tablet by mouth 2 (two) times daily with a meal. 180 tablet 1   No current facility-administered medications for this visit.    Medication Side Effects: None  Allergies:  Allergies  Allergen Reactions   Mirtazapine     Other reaction(s): Unknown    Past Medical History:  Diagnosis Date   ADHD 01/15/2009   Qualifier: Diagnosis of  By: Greer Leak MD,  Catherine     Anxiety state, unspecified 01/15/2009   Qualifier: Diagnosis of  By: Greer Leak MD, Catherine     Essential hypertension, benign 06/06/2008   Qualifier: Diagnosis of  By: Greer Leak MD, Annamarie Barrier 08/12/2009   Qualifier: Diagnosis of  By: Greer Leak MD, Catherine     HYPERTROPHY PROSTATE W/UR OBST & OTH LUTS 01/15/2009   Qualifier: Diagnosis of  By: Greer Leak MD, Easter Golden 06/06/2008   Qualifier: Diagnosis of  By: Greer Leak MD, Catherine     KNEE PAIN 08/12/2009   Qualifier: Diagnosis of  By: Greer Leak MD, Catherine     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    Family History  Problem Relation Age of Onset   Cirrhosis Father    Diabetes Mellitus II Father    Gallbladder disease Father    Gallbladder disease Sister    Gallbladder disease Son     Social History   Socioeconomic History   Marital status: Married    Spouse name: Not on file   Number of children: Not on file   Years of education: Not on file   Highest education level: Not on file  Occupational History   Not on file  Tobacco Use   Smoking status: Every Day    Current packs/day: 1.00    Average packs/day: 1 pack/day for 30.0 years (30.0 ttl pk-yrs)    Types: Cigarettes   Smokeless tobacco: Never  Substance and Sexual Activity   Alcohol use: Not on file   Drug use: Not on file   Sexual activity: Not on file  Other Topics Concern   Not on file  Social History Narrative   Not on file   Social Drivers of Health   Financial Resource Strain: Not on file  Food Insecurity: Not on file  Transportation Needs: Not on file  Physical Activity: Not on file  Stress: Not on file  Social Connections: Not on file  Intimate Partner Violence: Not on file    Past Medical History, Surgical history, Social history, and Family history were reviewed and updated as appropriate.   Please see review of systems for further details on the patient's review from today.    Objective:   Physical Exam:  There were no vitals taken for this visit.  Physical Exam Constitutional:      General: He is not in acute distress. Musculoskeletal:        General: No deformity.  Neurological:     Mental Status: He is alert and oriented to person, place, and time.     Coordination: Coordination normal.  Psychiatric:        Attention and Perception: Attention and perception normal. He does not perceive auditory or visual hallucinations.        Mood and Affect: Affect is not labile, blunt, angry or inappropriate.        Speech: Speech normal.        Behavior: Behavior normal.        Thought Content: Thought content normal. Thought content is not paranoid or delusional. Thought  content does not include homicidal or suicidal ideation. Thought content does not include homicidal or suicidal plan.        Cognition and Memory: Cognition and memory normal.        Judgment: Judgment normal.     Comments: Insight intact     Lab Review:     Component Value Date/Time   NA 142 03/08/2023 1418   NA 137 04/22/2018 0000   K 3.8 03/08/2023 1418   CL 104 03/08/2023 1418   CO2 30 03/08/2023 1418   GLUCOSE 106 (H) 03/08/2023 1418   BUN 15 03/08/2023 1418   BUN 12 04/22/2018 0000   CREATININE 1.09 03/08/2023 1418   CALCIUM 9.5 03/08/2023 1418   PROT 7.2 10/15/2022 0903   ALBUMIN 4.8 01/11/2017 1129   AST 36 (H) 10/15/2022 0903   ALT 48 (H) 10/15/2022 0903   ALKPHOS 224 (A) 04/22/2018 0000   BILITOT 0.6 10/15/2022 0903   GFRNONAA 81 03/18/2020 1003   GFRAA 94 03/18/2020 1003       Component Value Date/Time   WBC 7.7 10/15/2022 0903   RBC 4.74 10/15/2022 0903   HGB 14.7 10/15/2022 0903   HCT 42.7 10/15/2022 0903   PLT 203 10/15/2022 0903   MCV 90.1 10/15/2022 0903   MCH 31.0 10/15/2022 0903   MCHC 34.4 10/15/2022 0903   RDW 12.6 10/15/2022 0903    No results found for: "POCLITH", "LITHIUM"   No results found for: "PHENYTOIN", "PHENOBARB", "VALPROATE", "CBMZ"    .res Assessment: Plan:    Plan:  Adderall 20mg  TID   Seroquel  100mg  - 1 to 2 at hs - sent a year supply in October 2024.  RTC 3 months  Monitor BP between visits while taking stimulant medication.  Time spent with patient was 20 minutes. Greater than 50% of face to face time with patient was spent on counseling and coordination of care.   Discussed potential metabolic side effects associated with atypical antipsychotics, as well as potential risk for movement side effects. Advised pt to contact office if movement side effects occur.   Discussed potential benefits, risks, and side effects of stimulants with patient to include increased heart rate, palpitations, insomnia, increased anxiety, increased irritability, or decreased appetite.  Instructed patient to contact office if experiencing any significant tolerability issues.   There are no diagnoses linked to this encounter.   Please see After Visit Summary for patient specific instructions.  Future Appointments  Date Time Provider Department Center  09/15/2023  4:00 PM Dominigue Gellner, Ursula Gardner, NP [facility] None  10/08/2023  3:00 PM Cydney Draft, MD [facility] None    No orders of the defined types were placed in this encounter.     -------------------------------

## 2023-09-15 NOTE — Telephone Encounter (Signed)
 Copied from CRM [id number]. Topic: Clinical - Prescription Issue >> Sep 15, 2023 10:37 AM Hilton Lucky wrote: Reason for CRM:  sitaGLIPtin -metformin  (JANUMET ) 50-1000 MG tablet Patient calling to request a prior authorization be submitted to insurance for 90 day supply of this prescription.

## 2023-09-20 ENCOUNTER — Telehealth: Payer: Self-pay

## 2023-09-20 NOTE — Telephone Encounter (Signed)
Copied from CRM [id number]. Topic: Clinical - Prescription Issue >> Sep 20, 2023 11:35 AM Gary Stevens wrote: Reason for CRM: Patient calling to request an expedited action for his Janumet, as he is almost out of his medication.

## 2023-09-20 NOTE — Telephone Encounter (Signed)
Please call patient and see if maybe he has a new insurance.  When I put in the 1 that we currently have on file it says its actually tier 1 covered so it is not giving me any other options.  The other option to his he can call his insurance company and see what might be comparable that they will cover.

## 2023-09-20 NOTE — Telephone Encounter (Signed)
New insurance information under note tab for date 1/1/424.

## 2023-09-22 ENCOUNTER — Telehealth: Payer: Self-pay

## 2023-09-22 NOTE — Telephone Encounter (Signed)
Approval received for 30 day supply .  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

## 2023-09-22 NOTE — Telephone Encounter (Signed)
Copied from CRM [id number]. Topic: Clinical - Prescription Issue >> Sep 22, 2023  1:15 PM Geroge Baseman wrote: Reason for CRM: The patient is calling to state they pharmacy is telling them someone at this office needs to call over and get this prescription though for this patient. Patient  wants to be called ASAP with an update.

## 2023-09-22 NOTE — Telephone Encounter (Signed)
Janumet  50/1,000mg   tablets  Per insurance company approval  as  30/d supply only  ( qty # 60)  Approval  - case ID # [id number] Valid from 09/22/23- 08-30-2098.  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  He agrees and will check into this.

## 2023-09-22 NOTE — Telephone Encounter (Signed)
Copied from CRM [id number]. Topic: Clinical - Prescription Issue >> Sep 22, 2023  1:29 PM Nila Nephew wrote: Reason for CRM: Patient calling and demanding we send a prior authorization for his Ebbie Latus now. I tried to advise patient that per PCP note, he is covered through Tier 1 but patient insists he is not and is unwilling to contact insurance at this time. Patient is upset and would like PA sent through to [facility] immediately.  Patient requesting a call from [place] [facility] regarding this issue for information on timeframe and ETA to when he can get his medicine.

## 2023-09-23 ENCOUNTER — Other Ambulatory Visit: Payer: Self-pay | Admitting: Family Medicine

## 2023-09-23 DIAGNOSIS — E119 Type 2 diabetes mellitus without complications: Secondary | ICD-10-CM

## 2023-09-23 MED ORDER — JANUMET 50-1000 MG PO TABS: 1.0000 | ORAL_TABLET | Freq: Two times a day (BID) | ORAL | 1 refills | Status: DC

## 2023-09-23 NOTE — Telephone Encounter (Signed)
PA approval for Janumet received yesterday, but only for 30/d supply. Patient is going to 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

## 2023-09-23 NOTE — Telephone Encounter (Signed)
Copied from CRM [id number]. Topic: Clinical - Medication Refill >> Sep 23, 2023 10:09 AM Elle L wrote: Most Recent Primary Care Visit:  Provider: Nani Gasser D  Department: [facility]  Visit Type: OFFICE VISIT  Date: 03/08/2023  Medication: JsitaGLIPtin-metformin (JANUMET) 50-1000 MG tablet   Has the patient contacted their pharmacy? Yes. The prescription needs to be a 90 day supply per pharmacy and insurance.   Is this the correct pharmacy for this prescription? Yes  [id number] [place], [place] - [address] [place] [address] Phone: [phone] Fax: [phone]   Has the prescription been filled recently? No  Is the patient out of the medication? No  Has the patient been seen for an appointment in the last year OR does the patient have an upcoming appointment? Yes  Can we respond through MyChart? No  Agent: Please be advised that Rx refills may take up to 3 business days. We ask that you follow-up with your pharmacy.

## 2023-09-23 NOTE — Telephone Encounter (Signed)
Per Kim's note:  PA approval for Janumet received yesterday . But only for 30/d supply Patient is going to 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

## 2023-09-23 NOTE — Telephone Encounter (Signed)
PA approval for Janumet received yesterday . But only for 30/d supply Patient is going to 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

## 2023-09-23 NOTE — Telephone Encounter (Signed)
sitaGLIPtin-metformin (JANUMET) 50-1000 MG tablet 180 tablet 1 09/14/2023 --   Sig - Route: Take 1 tablet by mouth 2 (two) times daily with a meal. - Oral   Sent to pharmacy as: sitaGLIPtin-metformin (JANUMET) 50-1000 MG tablet   Notes to Pharmacy: [employer] INSURANCE: Member ZO#[id number] [phone] Rx bin# [id number] Pcn# [id number]   E-Prescribing Status: Receipt confirmed by pharmacy (09/14/2023 11:33 AM EST)

## 2023-10-01 ENCOUNTER — Telehealth: Payer: Self-pay | Admitting: Family Medicine

## 2023-10-01 DIAGNOSIS — E119 Type 2 diabetes mellitus without complications: Secondary | ICD-10-CM

## 2023-10-01 NOTE — Telephone Encounter (Signed)
Janumet requiring prior authorization.  He is technically already on metformin because it is in the Janumet.  Please initiate PA.

## 2023-10-08 ENCOUNTER — Encounter: Payer: Self-pay | Admitting: Family Medicine

## 2023-10-08 ENCOUNTER — Ambulatory Visit: Payer: BLUE CROSS/BLUE SHIELD | Admitting: Family Medicine

## 2023-10-08 VITALS — BP 128/84 | HR 64 | Ht 75.0 in | Wt 258.0 lb

## 2023-10-08 DIAGNOSIS — I10 Essential (primary) hypertension: Secondary | ICD-10-CM

## 2023-10-08 DIAGNOSIS — Z23 Encounter for immunization: Secondary | ICD-10-CM

## 2023-10-08 DIAGNOSIS — E118 Type 2 diabetes mellitus with unspecified complications: Secondary | ICD-10-CM

## 2023-10-08 DIAGNOSIS — E119 Type 2 diabetes mellitus without complications: Secondary | ICD-10-CM

## 2023-10-08 DIAGNOSIS — H60331 Swimmer's ear, right ear: Secondary | ICD-10-CM | POA: Diagnosis not present

## 2023-10-08 LAB — POCT UA - MICROALBUMIN
Creatinine, POC: 300 mg/dL
Microalbumin Ur, POC: 80 mg/L

## 2023-10-08 LAB — POCT GLYCOSYLATED HEMOGLOBIN (HGB A1C): Hemoglobin A1C: 6 % — AB (ref 4.0–5.6)

## 2023-10-08 MED ORDER — OFLOXACIN 0.3 % OT SOLN: 10.0000 [drp] | Freq: Two times a day (BID) | OTIC | 0 refills | Status: AC

## 2023-10-08 NOTE — Telephone Encounter (Signed)
 Did this get approved?

## 2023-10-08 NOTE — Assessment & Plan Note (Signed)
 Blood pressure is little elevated we will recheck before he goes today.  Will get updated labs.

## 2023-10-08 NOTE — Progress Notes (Signed)
 Established Patient Office Visit  Subjective  Patient ID: Gary Stevens, male    DOB: 1965/12/11  Age: 58 y.o. MRN: [id number]  Chief Complaint  Patient presents with   Hypertension   Diabetes         HPI  Having some right ear issues.  Says he went to urgent care on December 17 at [facility] for drainage from that right ear.  He was treated with oral amoxicillin  and ofloxacin  drops.  The ear was starting to improve but did not click completely clear up before he ran out of drops.   As [facility] through his insurance.    ROS    Objective:     BP 128/84   Pulse 64   Ht 6' 3 (1.905 m)   Wt 258 lb (117 kg)   SpO2 97%   BMI 32.25 kg/m    Physical Exam Vitals and nursing note reviewed.  Constitutional:      Appearance: Normal appearance.  HENT:     Head: Normocephalic and atraumatic.     Right Ear: External ear normal.     Left Ear: Tympanic membrane, ear canal and external ear normal.     Ears:     Comments: Right TM blocked by white wet debris.  Eyes:     Conjunctiva/sclera: Conjunctivae normal.  Cardiovascular:     Rate and Rhythm: Normal rate and regular rhythm.  Pulmonary:     Effort: Pulmonary effort is normal.     Breath sounds: Normal breath sounds.  Skin:    General: Skin is warm and dry.  Neurological:     Mental Status: He is alert.  Psychiatric:        Mood and Affect: Mood normal.      Results for orders placed or performed in visit on 10/08/23  POCT HgB A1C  Result Value Ref Range   Hemoglobin A1C 6.0 (A) 4.0 - 5.6 %   HbA1c POC (<> result, manual entry)     HbA1c, POC (prediabetic range)     HbA1c, POC (controlled diabetic range)    POCT UA - Microalbumin  Result Value Ref Range   Microalbumin Ur, POC 80 mg/L   Creatinine, POC 300 mg/dL   Albumin/Creatinine Ratio, Urine, POC 30-300       The 10-year ASCVD risk score (Arnett DK, et al., 2019) is: 29.4%    Assessment & Plan:    Problem List Items Addressed This Visit       Cardiovascular and Mediastinum   Essential hypertension   Blood pressure is little elevated we will recheck before he goes today.  Will get updated labs.      Relevant Orders   CMP14+EGFR   Lipid panel   CBC     Endocrine   Controlled type 2 diabetes mellitus with complication, without long-term current use of insulin (HCC)   1C looks fantastic today at 6.0.  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  We did have to get authorization with his Janumet  but they did cover it.      Other Visit Diagnoses       Controlled type 2 diabetes mellitus without complication, without long-term current use of insulin (HCC)    -  Primary   Relevant Orders   POCT HgB A1C (Completed)   CMP14+EGFR   Lipid panel   CBC   POCT UA - Microalbumin (  Completed)     Acute swimmer's ear of right side       Relevant Medications   ofloxacin  (FLOXIN ) 0.3 % OTIC solution     Encounter for immunization       Relevant Orders   Varicella-zoster vaccine IM (Completed)       Reminded him to schedule his eye exam.  Return in about 4 months (around 02/05/2024) for Hypertension, Diabetes follow-up.    Dorothyann Byars, MD

## 2023-10-08 NOTE — Assessment & Plan Note (Signed)
 1C looks fantastic today at 6.0.  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  We did have to get authorization with his Janumet  but they did cover it.

## 2023-10-08 NOTE — Progress Notes (Signed)
 A1c looks great at this time.  A little bit of protein in the urine we will keep an eye on that plan to recheck again next time.

## 2023-10-09 LAB — CMP14+EGFR
ALT: 42 [IU]/L (ref 0–44)
AST: 40 [IU]/L (ref 0–40)
Albumin: 4.6 g/dL (ref 3.8–4.9)
Alkaline Phosphatase: 111 [IU]/L (ref 44–121)
BUN/Creatinine Ratio: 10 (ref 9–20)
BUN: 13 mg/dL (ref 6–24)
Bilirubin Total: 0.7 mg/dL (ref 0.0–1.2)
CO2: 25 mmol/L (ref 20–29)
Calcium: 9.4 mg/dL (ref 8.7–10.2)
Chloride: 103 mmol/L (ref 96–106)
Creatinine, Ser: 1.25 mg/dL (ref 0.76–1.27)
Globulin, Total: 2.2 g/dL (ref 1.5–4.5)
Glucose: 108 mg/dL — ABNORMAL HIGH (ref 70–99)
Potassium: 4.4 mmol/L (ref 3.5–5.2)
Sodium: 144 mmol/L (ref 134–144)
Total Protein: 6.8 g/dL (ref 6.0–8.5)
eGFR: 67 mL/min/{1.73_m2} (ref >59)

## 2023-10-09 LAB — CBC
Hematocrit: 43.1 % (ref 37.5–51.0)
Hemoglobin: 14.7 g/dL (ref 13.0–17.7)
MCH: 30.6 pg (ref 26.6–33.0)
MCHC: 34.1 g/dL (ref 31.5–35.7)
MCV: 90 fL (ref 79–97)
Platelets: 227 10*3/uL (ref 150–450)
RBC: 4.81 x10E6/uL (ref 4.14–5.80)
RDW: 13 % (ref 11.6–15.4)
WBC: 8.1 10*3/uL (ref 3.4–10.8)

## 2023-10-09 LAB — LIPID PANEL
Chol/HDL Ratio: 4.3 {ratio} (ref 0.0–5.0)
Cholesterol, Total: 178 mg/dL (ref 100–199)
HDL: 41 mg/dL (ref >39)
LDL Chol Calc (NIH): 104 mg/dL — ABNORMAL HIGH (ref 0–99)
Triglycerides: 189 mg/dL — ABNORMAL HIGH (ref 0–149)
VLDL Cholesterol Cal: 33 mg/dL (ref 5–40)

## 2023-10-12 NOTE — Progress Notes (Signed)
Hi Gary Stevens, metabolic panel including liver function looks good this time.  Triglycerides also look much better which is great.  Blood count is normal.  Please let us know you had like your last eye exam so we can get your chart updated.

## 2023-10-26 NOTE — Telephone Encounter (Signed)
 Janumet  50/1,000mg   tablets  Per insurance company approval as 30/d supply only  (qty # 60)  Approval  - case ID # [id number] Valid from 09/22/23- 08-30-2098  I called the patient's insurance for a quantity limit override and was informed that insurance will only allow a 30/d supply.

## 2023-11-01 MED ORDER — JANUMET 50-1000 MG PO TABS: 1.0000 | ORAL_TABLET | Freq: Two times a day (BID) | ORAL | 11 refills | Status: DC

## 2023-11-01 NOTE — Telephone Encounter (Signed)
 Pleaes let pt know approved for 30 days at a time.   Meds ordered this encounter  Medications   sitaGLIPtin-metformin (JANUMET) 50-1000 MG tablet    Sig: Take 1 tablet by mouth 2 (two) times daily with a meal.    Dispense:  60 tablet    Refill:  11    [employer] INSURANCE: Member MV#[id number] UXLKG#[id number] Rx bin# [id number] Pcn# [id number]

## 2023-11-08 ENCOUNTER — Other Ambulatory Visit: Payer: Self-pay

## 2023-11-08 ENCOUNTER — Telehealth: Payer: Self-pay | Admitting: Adult Health

## 2023-11-08 DIAGNOSIS — F909 Attention-deficit hyperactivity disorder, unspecified type: Secondary | ICD-10-CM

## 2023-11-08 MED ORDER — AMPHETAMINE-DEXTROAMPHETAMINE 20 MG PO TABS: 20.0000 mg | ORAL_TABLET | Freq: Three times a day (TID) | ORAL | 0 refills | Status: DC

## 2023-11-08 NOTE — Telephone Encounter (Signed)
 Pended Adderall 20 mg, #90, to [facility] in [place].

## 2023-11-08 NOTE — Telephone Encounter (Signed)
 Patient called in for refill on Adderall 20mg . Ph: [phone] Appt 4/21 Pharmacy [facility] [address] [place]

## 2023-11-23 ENCOUNTER — Other Ambulatory Visit: Payer: Self-pay | Admitting: Family Medicine

## 2023-11-23 DIAGNOSIS — I10 Essential (primary) hypertension: Secondary | ICD-10-CM

## 2023-11-23 NOTE — Telephone Encounter (Signed)
 Copied from CRM [id number]. Topic: Clinical - Medication Refill >> Nov 23, 2023 12:41 PM Clayton Bibles wrote: Most Recent Primary Care Visit:  Provider: Nani Gasser D  Department: [facility] CARE MKV  Visit Type: OFFICE VISIT  Date: 10/08/2023  Medication: metoprolol succinate (TOPROL-XL) 100 MG 24 hr tablet AND Olmesartan-amLODIPine-HCTZ 40-5-25 MG TABS 90 days supply for both medications   Has the patient contacted their pharmacy? No (Agent: If no, request that the patient contact the pharmacy for the refill. If patient does not wish to contact the pharmacy document the reason why and proceed with request.) (Agent: If yes, when and what did the pharmacy advise?)  Is this the correct pharmacy for this prescription? Yes If no, delete pharmacy and type the correct one.  This is the patient's preferred pharmacy:   [id number] [place], [place] - [address] [place] [address] Phone: [phone] Fax: [phone]   Has the prescription been filled recently? No  Is the patient out of the medication? Yes - He has been out a few days  Has the patient been seen for an appointment in the last year OR does the patient have an upcoming appointment? Yes  Can we respond through MyChart? Yes  Agent: Please be advised that Rx refills may take up to 3 business days. We ask that you follow-up with your pharmacy.

## 2023-11-24 ENCOUNTER — Other Ambulatory Visit: Payer: Self-pay | Admitting: Family Medicine

## 2023-11-24 NOTE — Telephone Encounter (Signed)
 Copied from CRM [id number]. Topic: Clinical - Medication Refill >> Nov 24, 2023  2:15 PM Gildardo Pounds wrote: Most Recent Primary Care Visit:  Provider: Nani Gasser D  Department: [facility] CARE MKV  Visit Type: OFFICE VISIT  Date: 10/08/2023  Medication: metoprolol succinate (TOPROL-XL) 100 MG 24 hr tablet Olmesartan-amLODIPine-HCTZ 40-5-25 MG TABS  Has the patient contacted their pharmacy? Yes (Agent: If no, request that the patient contact the pharmacy for the refill. If patient does not wish to contact the pharmacy document the reason why and proceed with request.) (Agent: If yes, when and what did the pharmacy advise?)  Is this the correct pharmacy for this prescription? Yes If no, delete pharmacy and type the correct one.  This is the patient's preferred pharmacy:  [id number] [place] [place] - [address] [place] [address] Phone: [phone] Fax: [phone]   Has the prescription been filled recently?   Is the patient out of the medication? Yes  Has the patient been seen for an appointment in the last year OR does the patient have an upcoming appointment? Yes  Can we respond through MyChart? Yes  Agent: Please be advised that Rx refills may take up to 3 business days. We ask that you follow-up with your pharmacy.

## 2023-11-25 MED ORDER — METOPROLOL SUCCINATE ER 100 MG PO TB24: 100.0000 mg | ORAL_TABLET | Freq: Every day | ORAL | 1 refills | Status: DC

## 2023-11-25 MED ORDER — OLMESARTAN-AMLODIPINE-HCTZ 40-5-25 MG PO TABS: 1.0000 | ORAL_TABLET | Freq: Every day | ORAL | 1 refills | Status: DC

## 2023-12-13 ENCOUNTER — Telehealth: Payer: Self-pay | Admitting: Adult Health

## 2023-12-13 ENCOUNTER — Other Ambulatory Visit: Payer: Self-pay

## 2023-12-13 DIAGNOSIS — F909 Attention-deficit hyperactivity disorder, unspecified type: Secondary | ICD-10-CM

## 2023-12-13 MED ORDER — AMPHETAMINE-DEXTROAMPHETAMINE 20 MG PO TABS: ORAL_TABLET | ORAL | 0 refills | Status: DC

## 2023-12-13 NOTE — Telephone Encounter (Signed)
 Pended Adderall 20 mg to [facility] in [place]

## 2023-12-13 NOTE — Telephone Encounter (Signed)
 Patient called in for refill on Adderall 20mg . Ph: [phone] Appt 4/21 Pharmacy [facility] [address] [place],

## 2023-12-20 ENCOUNTER — Telehealth (INDEPENDENT_AMBULATORY_CARE_PROVIDER_SITE_OTHER): Payer: 59 | Admitting: Adult Health

## 2023-12-20 ENCOUNTER — Encounter: Payer: Self-pay | Admitting: Adult Health

## 2023-12-20 DIAGNOSIS — G4700 Insomnia, unspecified: Secondary | ICD-10-CM | POA: Diagnosis not present

## 2023-12-20 DIAGNOSIS — F331 Major depressive disorder, recurrent, moderate: Secondary | ICD-10-CM | POA: Diagnosis not present

## 2023-12-20 DIAGNOSIS — F909 Attention-deficit hyperactivity disorder, unspecified type: Secondary | ICD-10-CM

## 2023-12-20 DIAGNOSIS — F4310 Post-traumatic stress disorder, unspecified: Secondary | ICD-10-CM | POA: Diagnosis not present

## 2023-12-20 MED ORDER — AMPHETAMINE-DEXTROAMPHETAMINE 20 MG PO TABS: ORAL_TABLET | ORAL | 0 refills | Status: DC

## 2023-12-20 NOTE — Progress Notes (Addendum)
 Gary Stevens [id number] 10-14-1965 58 y.o.  Virtual Visit via Video Note  I connected with pt @ on 12/20/23 at  4:30 PM EDT by a video enabled telemedicine application and verified that I am speaking with the correct person using two identifiers.   I discussed the limitations of evaluation and management by telemedicine and the availability of in person appointments. The patient expressed understanding and agreed to proceed.  I discussed the assessment and treatment plan with the patient. The patient was provided an opportunity to ask questions and all were answered. The patient agreed with the plan and demonstrated an understanding of the instructions.   The patient was advised to call back or seek an in-person evaluation if the symptoms worsen or if the condition fails to improve as anticipated.  I provided 30 minutes of non-face-to-face time during this encounter.  The patient was located at home.  The provider was located at [facility].   Reagan Camera, NP   Subjective:   Patient ID:  Gary Stevens is a 58 y.o. (DOB July 02, 1966) male.  Chief Complaint: No chief complaint on file.  HPI TAHJ NJOKU presents for follow-up of ADHD, insomnia, MDD and PTSD.  Describes mood today as "ok". Pleasant. Denies tearfulness. Mood symptoms - reports some situational depression, anxiety and irritability. Reports stable interest and motivation. Denies panic attacks. Denies worry, rumination and over thinking. Reports grief/loss related to loss of sister in March. Mood is consistent. Stating "dealing with my sister's loss is tough". Feels like current medications work well. Taking medications as prescribed. Energy levels stable. Active, does not have a regular exercise routine.  Enjoys some usual interests and activities. Married. Lives with wife. Family local.  Appetite fluctuates. Weight loss - A1C improving - 6. Sleeps well most nights. Averages 6 to 8 hours with Seroquel .   Focus and concentration stable with Adderall. Completing tasks. Managing aspects of household. 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    Denies SI or HI.  Denies AH or VH.  Denies substance use. Denies self harm.  Review of Systems:  Review of Systems  Musculoskeletal:  Negative for gait problem.  Neurological:  Negative for tremors.  Psychiatric/Behavioral:         Please refer to HPI    Medications: I have reviewed the patient's current medications.  Current Outpatient Medications  Medication Sig Dispense Refill   amphetamine -dextroamphetamine  (ADDERALL) 20 MG tablet Take one tablet three times a day. 90 tablet 0   amphetamine -dextroamphetamine  (ADDERALL) 20 MG tablet Take 1 tablet (20 mg total) by mouth 3 (three) times daily. 90 tablet 0   amphetamine -dextroamphetamine  (ADDERALL) 20 MG tablet Take one tablet three times daily. 90 tablet 0   Blood Glucose Monitoring Suppl (ONE TOUCH ULTRA 2) w/Device KIT See admin instructions.  0   meloxicam  (MOBIC ) 15 MG tablet Take 1 tablet (15 mg total) by mouth daily. Take with food 15 tablet 0   metoprolol  succinate (TOPROL -XL) 100 MG 24 hr tablet Take 1 tablet (100 mg total) by mouth daily. Take with or immediately following a meal. 90 tablet 1   Olmesartan -amLODIPine -HCTZ 40-5-25 MG TABS Take 1 tablet by mouth daily. 90 tablet 1   ONE TOUCH ULTRA TEST test strip Check blood sugar 4 times daily 300 each prn   QUEtiapine  (SEROQUEL ) 100 MG tablet Take 1-2 tablets by mouth at bedtime 180 tablet 3   sitaGLIPtin -metformin  (JANUMET ) 50-1000 MG tablet Take 1 tablet by mouth 2 (two) times daily with  a meal. 60 tablet 11   No current facility-administered medications for this visit.    Medication Side Effects: None  Allergies:  Allergies  Allergen Reactions   Mirtazapine     Other reaction(s): Unknown    Past Medical History:  Diagnosis Date   ADHD 01/15/2009   Qualifier: Diagnosis of  By: Greer Leak MD, Catherine     Anxiety state,  unspecified 01/15/2009   Qualifier: Diagnosis of  By: Greer Leak MD, Catherine     Essential hypertension, benign 06/06/2008   Qualifier: Diagnosis of  By: Greer Leak MD, Annamarie Barrier 08/12/2009   Qualifier: Diagnosis of  By: Greer Leak MD, Catherine     HYPERTROPHY PROSTATE W/UR OBST & OTH LUTS 01/15/2009   Qualifier: Diagnosis of  By: Greer Leak MD, Easter Golden 06/06/2008   Qualifier: Diagnosis of  By: Greer Leak MD, Catherine     KNEE PAIN 08/12/2009   Qualifier: Diagnosis of  By: Greer Leak MD, Catherine     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    Family History  Problem Relation Age of Onset   Cirrhosis Father    Diabetes Mellitus II Father    Gallbladder disease Father    Gallbladder disease Sister    Gallbladder disease Son     Social History   Socioeconomic History   Marital status: Married    Spouse name: Not on file   Number of children: Not on file   Years of education: Not on file   Highest education level: Not on file  Occupational History   Not on file  Tobacco Use   Smoking status: Every Day    Current packs/day: 1.00    Average packs/day: 1 pack/day for 30.0 years (30.0 ttl pk-yrs)    Types: Cigarettes   Smokeless tobacco: Never  Substance and Sexual Activity   Alcohol use: Not on file   Drug use: Not on file   Sexual activity: Not on file  Other Topics Concern   Not on file  Social History Narrative   Not on file   Social Drivers of Health   Financial Resource Strain: Not on file  Food Insecurity: Not on file  Transportation Needs: Not on file  Physical Activity: Not on file  Stress: Not on file  Social Connections: Not on file  Intimate Partner Violence: Not on file    Past Medical History, Surgical history, Social history, and Family history were reviewed and updated as appropriate.   Please see review of systems for further details on the patient's review from today.   Objective:   Physical Exam:  There  were no vitals taken for this visit.  Physical Exam Constitutional:      General: He is not in acute distress. Musculoskeletal:        General: No deformity.  Neurological:     Mental Status: He is alert and oriented to person, place, and time.     Coordination: Coordination normal.  Psychiatric:        Attention and Perception: Attention and perception normal. He does not perceive auditory or visual hallucinations.        Mood and Affect: Mood normal. Mood is not anxious or depressed. Affect is not labile, blunt, angry or inappropriate.        Speech: Speech normal.        Behavior: Behavior normal.        Thought Content: Thought content normal. Thought content is not paranoid or delusional. Thought content  does not include homicidal or suicidal ideation. Thought content does not include homicidal or suicidal plan.        Cognition and Memory: Cognition and memory normal.        Judgment: Judgment normal.     Comments: Insight intact     Lab Review:     Component Value Date/Time   NA 144 10/08/2023 1541   K 4.4 10/08/2023 1541   CL 103 10/08/2023 1541   CO2 25 10/08/2023 1541   GLUCOSE 108 (H) 10/08/2023 1541   GLUCOSE 106 (H) 03/08/2023 1418   BUN 13 10/08/2023 1541   CREATININE 1.25 10/08/2023 1541   CREATININE 1.09 03/08/2023 1418   CALCIUM 9.4 10/08/2023 1541   PROT 6.8 10/08/2023 1541   ALBUMIN 4.6 10/08/2023 1541   AST 40 10/08/2023 1541   ALT 42 10/08/2023 1541   ALKPHOS 111 10/08/2023 1541   BILITOT 0.7 10/08/2023 1541   GFRNONAA 81 03/18/2020 1003   GFRAA 94 03/18/2020 1003       Component Value Date/Time   WBC 8.1 10/08/2023 1541   WBC 7.7 10/15/2022 0903   RBC 4.81 10/08/2023 1541   RBC 4.74 10/15/2022 0903   HGB 14.7 10/08/2023 1541   HCT 43.1 10/08/2023 1541   PLT 227 10/08/2023 1541   MCV 90 10/08/2023 1541   MCH 30.6 10/08/2023 1541   MCH 31.0 10/15/2022 0903   MCHC 34.1 10/08/2023 1541   MCHC 34.4 10/15/2022 0903   RDW 13.0 10/08/2023 1541     No results found for: "POCLITH", "LITHIUM"   No results found for: "PHENYTOIN", "PHENOBARB", "VALPROATE", "CBMZ"   .res Assessment: Plan:    Plan:  Adderall 20mg  TID   Seroquel  100mg  - 1 to 2 at hs - sent a year supply in October 2024.  Monitor BP between visits while taking stimulant medication.  RTC 3 months  30 minutes spent dedicated to the care of this patient on the date of this encounter to include pre-visit review of records, ordering of medication, post visit documentation, and face-to-face time with the patient discussing ADHD, insomnia, MDD and PTSD. Discussed continuing current medication regimen.  Discussed potential metabolic side effects associated with atypical antipsychotics, as well as potential risk for movement side effects. Advised pt to contact office if movement side effects occur.   Discussed potential benefits, risks, and side effects of stimulants with patient to include increased heart rate, palpitations, insomnia, increased anxiety, increased irritability, or decreased appetite.  Instructed patient to contact office if experiencing any significant tolerability issues.  Diagnoses and all orders for this visit:  Major depressive disorder, recurrent episode, moderate (HCC)  PTSD (post-traumatic stress disorder)  Insomnia, unspecified type  Attention deficit hyperactivity disorder (ADHD), unspecified ADHD type     Please see After Visit Summary for patient specific instructions.  Future Appointments  Date Time Provider Department Center  02/07/2024  2:00 PM Cydney Draft, MD [facility] None    No orders of the defined types were placed in this encounter.     -------------------------------

## 2023-12-20 NOTE — Addendum Note (Signed)
 Addended by: Reagan Camera on: 12/20/2023 05:02 PM   Modules accepted: Orders

## 2024-02-07 ENCOUNTER — Ambulatory Visit: Payer: Self-pay | Admitting: Family Medicine

## 2024-02-10 ENCOUNTER — Telehealth: Payer: Self-pay | Admitting: Adult Health

## 2024-02-10 NOTE — Telephone Encounter (Signed)
 Pt called asking for a refill on his adderall 20 mg. Pharmacy is [facility]  [place]. Next appt is 03/09/24

## 2024-02-11 ENCOUNTER — Other Ambulatory Visit: Payer: Self-pay

## 2024-02-11 DIAGNOSIS — F909 Attention-deficit hyperactivity disorder, unspecified type: Secondary | ICD-10-CM

## 2024-02-11 MED ORDER — AMPHETAMINE-DEXTROAMPHETAMINE 20 MG PO TABS
20.0000 mg | ORAL_TABLET | Freq: Three times a day (TID) | ORAL | 0 refills | Status: DC
Start: 2024-02-11 — End: 2024-04-14

## 2024-02-11 MED ORDER — AMPHETAMINE-DEXTROAMPHETAMINE 20 MG PO TABS: ORAL_TABLET | ORAL | 0 refills | Status: DC

## 2024-02-11 NOTE — Telephone Encounter (Signed)
 Pended Adderall 20 mg to [facility] in [place]

## 2024-03-09 ENCOUNTER — Ambulatory Visit: Payer: Self-pay | Admitting: Family Medicine

## 2024-03-13 ENCOUNTER — Telehealth: Payer: Self-pay | Admitting: Adult Health

## 2024-03-13 NOTE — Telephone Encounter (Signed)
 Per clinical, pt has RF for start date 7/11. Patient notifed. Doesn't need the Adderall refill now.

## 2024-03-13 NOTE — Telephone Encounter (Deleted)
 Next visit is 03/20/24. Gary Stevens is requesting a refill on Adderall 20 mg called to   [id number] - [place], [place] - [address] [facility] [address]   Phone: [phone]  Fax: [phone]

## 2024-03-13 NOTE — Telephone Encounter (Signed)
 Next visit is 03/20/24. Kani is requesting a refill on Adderall 20 mg called to   [id number] - [place], [place] - [address] [facility] [address]   Phone: [phone]  Fax: [phone]

## 2024-03-13 NOTE — Telephone Encounter (Signed)
 Pt has RF for start date 7/11. Patient notified.

## 2024-03-20 ENCOUNTER — Encounter: Payer: Self-pay | Admitting: Adult Health

## 2024-03-20 ENCOUNTER — Telehealth (INDEPENDENT_AMBULATORY_CARE_PROVIDER_SITE_OTHER): Admitting: Adult Health

## 2024-03-20 DIAGNOSIS — F909 Attention-deficit hyperactivity disorder, unspecified type: Secondary | ICD-10-CM | POA: Diagnosis not present

## 2024-03-20 DIAGNOSIS — F331 Major depressive disorder, recurrent, moderate: Secondary | ICD-10-CM | POA: Diagnosis not present

## 2024-03-20 DIAGNOSIS — G4700 Insomnia, unspecified: Secondary | ICD-10-CM | POA: Diagnosis not present

## 2024-03-20 DIAGNOSIS — F4310 Post-traumatic stress disorder, unspecified: Secondary | ICD-10-CM | POA: Diagnosis not present

## 2024-03-20 MED ORDER — AMPHETAMINE-DEXTROAMPHETAMINE 20 MG PO TABS: ORAL_TABLET | ORAL | 0 refills | Status: DC

## 2024-03-20 NOTE — Progress Notes (Signed)
 Gary Stevens [id number] Feb 10, 1966 58 y.o.  Virtual Visit via Video Note  I connected with pt @ on 03/20/24 at  4:00 PM EDT by a video enabled telemedicine application and verified that I am speaking with the correct person using two identifiers.   I discussed the limitations of evaluation and management by telemedicine and the availability of in person appointments. The patient expressed understanding and agreed to proceed.  I discussed the assessment and treatment plan with the patient. The patient was provided an opportunity to ask questions and all were answered. The patient agreed with the plan and demonstrated an understanding of the instructions.   The patient was advised to call back or seek an in-person evaluation if the symptoms worsen or if the condition fails to improve as anticipated.  I provided 25 minutes of non-face-to-face time during this encounter.  The patient was located at home.  The provider was located at [facility].   Gary LOISE Sayers, NP   Subjective:   Patient ID:  Gary Stevens is a 58 y.o. (DOB 12-Nov-1965) male.  Chief Complaint: No chief complaint on file.   HPI KREG EARHART presents for follow-up of ADHD, insomnia, MDD and PTSD.  Describes mood today as ok. Pleasant. Denies tearfulness. Mood symptoms - denies depression, anxiety and irritability. Reports stable interest and motivation. Denies panic attacks. Denies worry, rumination and over thinking. Reports grief/loss - thinking about loss of sister - father - relationship with mother. Reports mood is stable. Stating I feel like I'm doing alright. Feels like current medications work well. Taking medications as prescribed. Energy levels stable. Active, does not have a regular exercise routine.  Enjoys some usual interests and activities. Married. Lives with wife. Family local.  Appetite fluctuates. Weight loss - A1C improving - 6. Sleeps well most nights. Averages 8 to 10 hours  with Seroquel .  Focus and concentration stable with Adderall. Completing tasks. Managing aspects of household. 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    Denies SI or HI.  Denies AH or VH.  Denies substance use. Denies self harm.   Review of Systems:  Review of Systems  Musculoskeletal:  Negative for gait problem.  Neurological:  Negative for tremors.  Psychiatric/Behavioral:         Please refer to HPI    Medications: I have reviewed the patient's current medications.  Current Outpatient Medications  Medication Sig Dispense Refill   amphetamine -dextroamphetamine  (ADDERALL) 20 MG tablet Take one tablet three times a day. 90 tablet 0   amphetamine -dextroamphetamine  (ADDERALL) 20 MG tablet Take one tablet three times daily. 90 tablet 0   amphetamine -dextroamphetamine  (ADDERALL) 20 MG tablet Take 1 tablet (20 mg total) by mouth 3 (three) times daily. 90 tablet 0   Blood Glucose Monitoring Suppl (ONE TOUCH ULTRA 2) w/Device KIT See admin instructions.  0   meloxicam  (MOBIC ) 15 MG tablet Take 1 tablet (15 mg total) by mouth daily. Take with food 15 tablet 0   metoprolol  succinate (TOPROL -XL) 100 MG 24 hr tablet Take 1 tablet (100 mg total) by mouth daily. Take with or immediately following a meal. 90 tablet 1   Olmesartan -amLODIPine -HCTZ 40-5-25 MG TABS Take 1 tablet by mouth daily. 90 tablet 1   ONE TOUCH ULTRA TEST test strip Check blood sugar 4 times daily 300 each prn   QUEtiapine  (SEROQUEL ) 100 MG tablet Take 1-2 tablets by mouth at bedtime 180 tablet 3   sitaGLIPtin -metformin  (JANUMET ) 50-1000 MG tablet Take 1 tablet by mouth  2 (two) times daily with a meal. 60 tablet 11   No current facility-administered medications for this visit.    Medication Side Effects: None  Allergies:  Allergies  Allergen Reactions   Mirtazapine     Other reaction(s): Unknown    Past Medical History:  Diagnosis Date   ADHD 01/15/2009   Qualifier: Diagnosis of  By: Alvan MD, Catherine      Anxiety state, unspecified 01/15/2009   Qualifier: Diagnosis of  By: Alvan MD, Catherine     Essential hypertension, benign 06/06/2008   Qualifier: Diagnosis of  By: Alvan MD, Dorothyann DANES 08/12/2009   Qualifier: Diagnosis of  By: Alvan MD, Catherine     HYPERTROPHY PROSTATE W/UR OBST & OTH LUTS 01/15/2009   Qualifier: Diagnosis of  By: Alvan MD, Dorothyann BEGUN 06/06/2008   Qualifier: Diagnosis of  By: Alvan MD, Catherine     KNEE PAIN 08/12/2009   Qualifier: Diagnosis of  By: Alvan MD, Catherine     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    Family History  Problem Relation Age of Onset   Cirrhosis Father    Diabetes Mellitus II Father    Gallbladder disease Father    Gallbladder disease Sister    Gallbladder disease Son     Social History   Socioeconomic History   Marital status: Married    Spouse name: Not on file   Number of children: Not on file   Years of education: Not on file   Highest education level: Not on file  Occupational History   Not on file  Tobacco Use   Smoking status: Every Day    Current packs/day: 1.00    Average packs/day: 1 pack/day for 30.0 years (30.0 ttl pk-yrs)    Types: Cigarettes   Smokeless tobacco: Never  Substance and Sexual Activity   Alcohol use: Not on file   Drug use: Not on file   Sexual activity: Not on file  Other Topics Concern   Not on file  Social History Narrative   Not on file   Social Drivers of Health   Financial Resource Strain: Not on file  Food Insecurity: Not on file  Transportation Needs: Not on file  Physical Activity: Not on file  Stress: Not on file  Social Connections: Not on file  Intimate Partner Violence: Not on file    Past Medical History, Surgical history, Social history, and Family history were reviewed and updated as appropriate.   Please see review of systems for further details on the patient's review from today.   Objective:   Physical  Exam:  There were no vitals taken for this visit.  Physical Exam Constitutional:      General: He is not in acute distress. Musculoskeletal:        General: No deformity.  Neurological:     Mental Status: He is alert and oriented to person, place, and time.     Coordination: Coordination normal.  Psychiatric:        Attention and Perception: Attention and perception normal. He does not perceive auditory or visual hallucinations.        Mood and Affect: Mood normal. Mood is not anxious or depressed. Affect is not labile, blunt, angry or inappropriate.        Speech: Speech normal.        Behavior: Behavior normal.        Thought Content: Thought content normal. Thought content is not  paranoid or delusional. Thought content does not include homicidal or suicidal ideation. Thought content does not include homicidal or suicidal plan.        Cognition and Memory: Cognition and memory normal.        Judgment: Judgment normal.     Comments: Insight intact     Lab Review:     Component Value Date/Time   NA 144 10/08/2023 1541   K 4.4 10/08/2023 1541   CL 103 10/08/2023 1541   CO2 25 10/08/2023 1541   GLUCOSE 108 (H) 10/08/2023 1541   GLUCOSE 106 (H) 03/08/2023 1418   BUN 13 10/08/2023 1541   CREATININE 1.25 10/08/2023 1541   CREATININE 1.09 03/08/2023 1418   CALCIUM 9.4 10/08/2023 1541   PROT 6.8 10/08/2023 1541   ALBUMIN 4.6 10/08/2023 1541   AST 40 10/08/2023 1541   ALT 42 10/08/2023 1541   ALKPHOS 111 10/08/2023 1541   BILITOT 0.7 10/08/2023 1541   GFRNONAA 81 03/18/2020 1003   GFRAA 94 03/18/2020 1003       Component Value Date/Time   WBC 8.1 10/08/2023 1541   WBC 7.7 10/15/2022 0903   RBC 4.81 10/08/2023 1541   RBC 4.74 10/15/2022 0903   HGB 14.7 10/08/2023 1541   HCT 43.1 10/08/2023 1541   PLT 227 10/08/2023 1541   MCV 90 10/08/2023 1541   MCH 30.6 10/08/2023 1541   MCH 31.0 10/15/2022 0903   MCHC 34.1 10/08/2023 1541   MCHC 34.4 10/15/2022 0903   RDW 13.0  10/08/2023 1541    No results found for: POCLITH, LITHIUM   No results found for: PHENYTOIN, PHENOBARB, VALPROATE, CBMZ   .res Assessment: Plan:    Plan:  Adderall 20mg  TID   Seroquel  100mg  - 1 to 2 at hs - sent a year supply in October 2024.  Monitor BP between visits while taking stimulant medication.  RTC 3 months  30 minutes spent dedicated to the care of this patient on the date of this encounter to include pre-visit review of records, ordering of medication, post visit documentation, and face-to-face time with the patient discussing ADHD, insomnia, MDD and PTSD. Discussed continuing current medication regimen.  Discussed potential metabolic side effects associated with atypical antipsychotics, as well as potential risk for movement side effects. Advised pt to contact office if movement side effects occur.   Discussed potential benefits, risks, and side effects of stimulants with patient to include increased heart rate, palpitations, insomnia, increased anxiety, increased irritability, or decreased appetite.  Instructed patient to contact office if experiencing any significant tolerability issues.  There are no diagnoses linked to this encounter.   Please see After Visit Summary for patient specific instructions.  Future Appointments  Date Time Provider Department Center  03/20/2024  4:00 PM Skylyn Slezak, Gary Mattocks, NP [facility] None  04/24/2024  1:40 PM Alvan Dorothyann BIRCH, MD [facility] None    No orders of the defined types were placed in this encounter.     -------------------------------

## 2024-04-14 ENCOUNTER — Telehealth: Payer: Self-pay | Admitting: Adult Health

## 2024-04-14 ENCOUNTER — Other Ambulatory Visit: Payer: Self-pay

## 2024-04-14 DIAGNOSIS — F909 Attention-deficit hyperactivity disorder, unspecified type: Secondary | ICD-10-CM

## 2024-04-14 MED ORDER — AMPHETAMINE-DEXTROAMPHETAMINE 20 MG PO TABS: ORAL_TABLET | ORAL | 0 refills | Status: DC

## 2024-04-14 MED ORDER — AMPHETAMINE-DEXTROAMPHETAMINE 20 MG PO TABS: 20.0000 mg | ORAL_TABLET | Freq: Three times a day (TID) | ORAL | 0 refills | Status: DC

## 2024-04-14 NOTE — Telephone Encounter (Signed)
 Canceled at [facility]

## 2024-04-14 NOTE — Telephone Encounter (Signed)
 Pt called asking for a refill of his adderall 20 mg. Please cancel the 7/21 script because it was sent to the wrong pharmacy. Please send in a new script of his adderall to the [facility] in advance 

## 2024-04-20 ENCOUNTER — Telehealth: Payer: Self-pay

## 2024-04-20 DIAGNOSIS — E119 Type 2 diabetes mellitus without complications: Secondary | ICD-10-CM

## 2024-04-20 MED ORDER — JANUMET 50-1000 MG PO TABS: 1.0000 | ORAL_TABLET | Freq: Two times a day (BID) | ORAL | 1 refills | Status: DC

## 2024-04-20 NOTE — Telephone Encounter (Signed)
 Patient called - requesting to move  Janumet  rx  to [facility] in advance  and send as 90 day supply  Resent with current refill remaining fills. ( Until March 2026)

## 2024-04-20 NOTE — Telephone Encounter (Signed)
 Copied from CRM #[id number]. Topic: Clinical - Prescription Issue >> Apr 20, 2024 11:47 AM Graeme ORN wrote: Reason for CRM: [facility] called. Checking on request. States patient requested to have it filled at their pharmacy - currently at [facility]. sitaGLIPtin -metformin  (JANUMET ) 50-1000 MG tablet Phone: [phone] Fax: [phone] >> Apr 20, 2024 11:56 AM Graeme ORN wrote: Patient called after pharmacy request that it be sent [facility]. State he is unable to refill at [facility] because they dont do 90 days. He is currently out of town for work. Will be back tomorrow. Has enough medication until tomorrow. Thank You

## 2024-04-24 ENCOUNTER — Encounter: Payer: Self-pay | Admitting: Family Medicine

## 2024-04-24 ENCOUNTER — Ambulatory Visit: Payer: Self-pay | Admitting: Family Medicine

## 2024-04-24 VITALS — BP 132/84 | HR 67 | Ht 75.0 in | Wt 262.1 lb

## 2024-04-24 DIAGNOSIS — E119 Type 2 diabetes mellitus without complications: Secondary | ICD-10-CM

## 2024-04-24 DIAGNOSIS — Z7984 Long term (current) use of oral hypoglycemic drugs: Secondary | ICD-10-CM

## 2024-04-24 DIAGNOSIS — E118 Type 2 diabetes mellitus with unspecified complications: Secondary | ICD-10-CM

## 2024-04-24 DIAGNOSIS — I10 Essential (primary) hypertension: Secondary | ICD-10-CM

## 2024-04-24 DIAGNOSIS — F4321 Adjustment disorder with depressed mood: Secondary | ICD-10-CM

## 2024-04-24 DIAGNOSIS — H60331 Swimmer's ear, right ear: Secondary | ICD-10-CM

## 2024-04-24 DIAGNOSIS — Z1211 Encounter for screening for malignant neoplasm of colon: Secondary | ICD-10-CM

## 2024-04-24 LAB — POCT GLYCOSYLATED HEMOGLOBIN (HGB A1C): Hemoglobin A1C: 6.5 % — AB (ref 4.0–5.6)

## 2024-04-24 MED ORDER — NEOMYCIN-POLYMYXIN-HC 3.5-10000-1 OT SOLN: 4.0000 [drp] | Freq: Four times a day (QID) | OTIC | 0 refills | Status: DC

## 2024-04-24 NOTE — Progress Notes (Signed)
 Established Patient Office Visit  Subjective  Patient ID: Gary Stevens, male    DOB: 1966-08-28  Age: 58 y.o. MRN: [id number]  Chief Complaint  Patient presents with   Diabetes   Hypertension   Ear Pain    R ear     HPI  Discussed the use of AI scribe software for clinical note transcription with the patient, who gave verbal consent to proceed.  History of Present Illness Gary Stevens is a 58 year old male with hypertension and diabetes who presents with an ear infection and elevated blood pressure.  Otorrhea and unilateral hearing loss - Whitish-yellow ear drainage for the past three to four weeks - Uses Q-tips to clean the affected ear - Rinses ear with hot water in the shower for symptom relief - No history of swimming or exposure to pool water - Only functional hearing in the affected ear; contralateral ear described as nonfunctional  Hypertension - Elevated blood pressure reading at today's visit - Attributes elevated reading to stress and discomfort from the blood pressure cuff - Requests manual blood pressure measurement due to discomfort with automated cuff - History of hypertension  Hyperglycemia and weight gain - Recent increase in blood glucose levels - Hemoglobin A1c increased to 6.5 from 6.0 - Attributes hyperglycemia and weight gain to stress - Currently managing diabetes and monitoring diet  Psychosocial stressors - Significant stress due to family issues - Recent death of sister, age 80,  from myocardial infarction - Mother's current living situation requiring transition to assisted living - Active involvement in managing family responsibilities - Recent loss of wife's dog contributing to emotional distress      ROS    Objective:     BP 132/84   Pulse 67   Ht 6' 3 (1.905 m)   Wt 262 lb 1.3 oz (118.9 kg)   SpO2 97%   BMI 32.76 kg/m    Physical Exam Vitals and nursing note reviewed.  Constitutional:      Appearance: Normal  appearance.  HENT:     Head: Normocephalic and atraumatic.     Ears:     Comments: Left TM is blocked by cerumen.  Right is full of white debris.   Eyes:     Conjunctiva/sclera: Conjunctivae normal.  Cardiovascular:     Rate and Rhythm: Normal rate and regular rhythm.  Pulmonary:     Effort: Pulmonary effort is normal.     Breath sounds: Normal breath sounds.  Skin:    General: Skin is warm and dry.  Neurological:     Mental Status: He is alert.  Psychiatric:        Mood and Affect: Mood normal.       Assessment and Plan Assessment & Plan    Results for orders placed or performed in visit on 04/24/24  POCT HgB A1C  Result Value Ref Range   Hemoglobin A1C 6.5 (A) 4.0 - 5.6 %   HbA1c POC (<> result, manual entry)     HbA1c, POC (prediabetic range)     HbA1c, POC (controlled diabetic range)        The 10-year ASCVD risk score (Arnett DK, et al., 2019) is: 27.8%    Assessment & Plan:   Problem List Items Addressed This Visit       Cardiovascular and Mediastinum   Essential hypertension   Relevant Orders   CMP14+EGFR     Endocrine   Controlled type 2 diabetes mellitus with complication, without long-term  current use of insulin (HCC)   Relevant Orders   Ambulatory referral to Optometry   Other Visit Diagnoses       Controlled type 2 diabetes mellitus without complication, without long-term current use of insulin (HCC)    -  Primary   Relevant Orders   POCT HgB A1C (Completed)   CMP14+EGFR     Acute swimmer's ear of right side       Relevant Medications   neomycin -polymyxin-hydrocortisone (CORTISPORIN) OTIC solution     Grief         Screen for colon cancer       Relevant Orders   Cologuard      Assessment and Plan Assessment & Plan Otitis externa, left ear Chronic otitis externa with drainage and whitish-yellowish discharge for three to four weeks. Ear open to eardrum likely due to self-irrigation. - Prescribe ear drops for one week. - Advise  irrigation with half peroxide, half water if needed. - Re-evaluate if symptoms do not improve by the end of the week. - Schedule follow-up if symptoms persist.  Type 2 diabetes mellitus A1c increased from 6.0 to 6.5, indicating slight worsening of glycemic control. Weight gain noted. Stress may be a contributing factor. - Order blood work. - Encourage stress management.  Hypertension Blood pressure elevated today, possibly due to stress or discomfort from the automatic cuff. Previous readings were well-controlled at 128/84 mmHg. Family history of heart disease noted. Discussed cardiac CT to assess for plaque buildup. - Recheck blood pressure manually. - Provide refills for blood pressure medication. - Consider cardiac CT later this year or early next year to evaluate for plaque buildup.  Grief - working with psychiatrist and is in touch with her.    Return in about 6 months (around 10/25/2024) for Diabetes follow-up, Hypertension.    Dorothyann Byars, MD

## 2024-04-25 ENCOUNTER — Telehealth: Payer: Self-pay

## 2024-04-25 ENCOUNTER — Ambulatory Visit: Payer: Self-pay | Admitting: Family Medicine

## 2024-04-25 LAB — CMP14+EGFR
ALT: 43 IU/L (ref 0–44)
AST: 34 IU/L (ref 0–40)
Albumin: 4.4 g/dL (ref 3.8–4.9)
Alkaline Phosphatase: 111 IU/L (ref 44–121)
BUN/Creatinine Ratio: 11 (ref 9–20)
BUN: 13 mg/dL (ref 6–24)
Bilirubin Total: 0.7 mg/dL (ref 0.0–1.2)
CO2: 23 mmol/L (ref 20–29)
Calcium: 9.1 mg/dL (ref 8.7–10.2)
Chloride: 103 mmol/L (ref 96–106)
Creatinine, Ser: 1.2 mg/dL (ref 0.76–1.27)
Globulin, Total: 2.2 g/dL (ref 1.5–4.5)
Glucose: 107 mg/dL — ABNORMAL HIGH (ref 70–99)
Potassium: 4.1 mmol/L (ref 3.5–5.2)
Sodium: 143 mmol/L (ref 134–144)
Total Protein: 6.6 g/dL (ref 6.0–8.5)
eGFR: 70 mL/min/1.73 (ref >59)

## 2024-04-25 NOTE — Telephone Encounter (Signed)
 Copied from CRM #[id number]. Topic: Clinical - Lab/Test Results >> Apr 25, 2024 10:30 AM Corin V wrote: Reason for CRM: Patient called to get lab results. Read provider note verbatim. Patient verbalized understanding and has no additional questions or concerns at this time.

## 2024-04-25 NOTE — Telephone Encounter (Signed)
 Patient informed of lab results by [employer] representative  Corin V

## 2024-04-25 NOTE — Progress Notes (Signed)
 Your lab work is within acceptable range and there are no concerning findings.   ?

## 2024-05-19 ENCOUNTER — Other Ambulatory Visit: Payer: Self-pay | Admitting: Family Medicine

## 2024-05-19 DIAGNOSIS — I10 Essential (primary) hypertension: Secondary | ICD-10-CM

## 2024-06-20 ENCOUNTER — Telehealth: Admitting: Adult Health

## 2024-06-20 ENCOUNTER — Encounter: Payer: Self-pay | Admitting: Adult Health

## 2024-06-20 DIAGNOSIS — F909 Attention-deficit hyperactivity disorder, unspecified type: Secondary | ICD-10-CM

## 2024-06-20 DIAGNOSIS — F331 Major depressive disorder, recurrent, moderate: Secondary | ICD-10-CM

## 2024-06-20 DIAGNOSIS — G4700 Insomnia, unspecified: Secondary | ICD-10-CM

## 2024-06-20 DIAGNOSIS — F4310 Post-traumatic stress disorder, unspecified: Secondary | ICD-10-CM | POA: Diagnosis not present

## 2024-06-20 NOTE — Progress Notes (Signed)
 Gary Stevens [id number] 12-23-1965 58 y.o.  Virtual Visit via Video Note  I connected with pt @ on 06/20/24 at  2:30 PM EDT by a video enabled telemedicine application and verified that I am speaking with the correct person using two identifiers.   I discussed the limitations of evaluation and management by telemedicine and the availability of in person appointments. The patient expressed understanding and agreed to proceed.  I discussed the assessment and treatment plan with the patient. The patient was provided an opportunity to ask questions and all were answered. The patient agreed with the plan and demonstrated an understanding of the instructions.   The patient was advised to call back or seek an in-person evaluation if the symptoms worsen or if the condition fails to improve as anticipated.  I provided 20 minutes of non-face-to-face time during this encounter.  The patient was located at home.  The provider was located at [facility].   Gary Gary Sayers, NP   Subjective:   Patient ID:  Gary Stevens is a 58 y.o. (DOB Jun 10, 1966) male.  Chief Complaint: No chief complaint on file.   HPI Gary Stevens presents for follow-up of ADHD, insomnia, MDD and PTSD.  Describes mood today as ok. Pleasant. Denies tearfulness. Mood symptoms - denies depression, anxiety and irritability. Reports stable interest and motivation. Denies panic attacks. Denies worry, rumination and over thinking. Reports mood is stable. Stating I feel like I'm doing ok. Feels like current medications work well. Taking medications as prescribed. Energy levels stable. Active, does not have a regular exercise routine.  Enjoys some usual interests and activities. Married. Lives with wife. Family local.  Appetite fluctuates. Weight loss. Sleeps well most nights. Averages 8 to 10 hours with Seroquel .  Focus and concentration stable. Feels like the Adderall continues to work well. Completing tasks.  Managing aspects of household. 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    Denies SI or HI.  Denies AH or VH.  Denies substance use. Denies self harm.   Review of Systems:  Review of Systems  Musculoskeletal:  Negative for gait problem.  Neurological:  Negative for tremors.  Psychiatric/Behavioral:         Please refer to HPI    Medications: I have reviewed the patient's current medications.  Current Outpatient Medications  Medication Sig Dispense Refill   amphetamine -dextroamphetamine  (ADDERALL) 20 MG tablet Take 1 tablet (20 mg total) by mouth 3 (three) times daily. 90 tablet 0   amphetamine -dextroamphetamine  (ADDERALL) 20 MG tablet Take one tablet three times a day. 90 tablet 0   amphetamine -dextroamphetamine  (ADDERALL) 20 MG tablet Take one tablet three times daily. 90 tablet 0   Blood Glucose Monitoring Suppl (ONE TOUCH ULTRA 2) w/Device KIT See admin instructions.  0   metoprolol  succinate (TOPROL -XL) 100 MG 24 hr tablet TAKE 1 TABLET BY MOUTH DAILY. TAKE WITH OR IMMEDIATELY FOLLOWING A MEAL. 90 tablet 1   neomycin -polymyxin-hydrocortisone (CORTISPORIN) OTIC solution Place 4 drops into the right ear 4 (four) times daily. X 7 days 10 mL 0   Olmesartan -amLODIPine -HCTZ 40-5-25 MG TABS TAKE 1 TABLET BY MOUTH EVERY DAY 90 tablet 1   ONE TOUCH ULTRA TEST test strip Check blood sugar 4 times daily 300 each prn   QUEtiapine  (SEROQUEL ) 100 MG tablet Take 1-2 tablets by mouth at bedtime 180 tablet 3   sitaGLIPtin -metformin  (JANUMET ) 50-1000 MG tablet Take 1 tablet by mouth 2 (two) times daily with a meal. 180 tablet 1   No current  facility-administered medications for this visit.    Medication Side Effects: None  Allergies:  Allergies  Allergen Reactions   Mirtazapine     Other reaction(s): Unknown    Past Medical History:  Diagnosis Date   ADHD 01/15/2009   Qualifier: Diagnosis of  By: Alvan MD, Catherine     Anxiety state, unspecified 01/15/2009   Qualifier: Diagnosis of   By: Alvan MD, Catherine     Essential hypertension, benign 06/06/2008   Qualifier: Diagnosis of  By: Alvan MD, Gary Stevens 08/12/2009   Qualifier: Diagnosis of  By: Alvan MD, Catherine     HYPERTROPHY PROSTATE W/UR OBST & OTH LUTS 01/15/2009   Qualifier: Diagnosis of  By: Alvan MD, Gary     INSOMNIA 06/06/2008   Qualifier: Diagnosis of  By: Alvan MD, Catherine     KNEE PAIN 08/12/2009   Qualifier: Diagnosis of  By: Alvan MD, Catherine     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    Family History  Problem Relation Age of Onset   Cirrhosis Father    Diabetes Mellitus II Father    Gallbladder disease Father    Heart attack Sister 84   Gallbladder disease Sister    Hypertension Sister    Gallbladder disease Son     Social History   Socioeconomic History   Marital status: Married    Spouse name: Not on file   Number of children: Not on file   Years of education: Not on file   Highest education level: Not on file  Occupational History   Not on file  Tobacco Use   Smoking status: Every Day    Current packs/day: 1.00    Average packs/day: 1 pack/day for 30.0 years (30.0 ttl pk-yrs)    Types: Cigarettes   Smokeless tobacco: Never  Substance and Sexual Activity   Alcohol use: Not on file   Drug use: Not on file   Sexual activity: Not on file  Other Topics Concern   Not on file  Social History Narrative   Not on file   Social Drivers of Health   Financial Resource Strain: Not on file  Food Insecurity: No Food Insecurity (04/24/2024)   Hunger Vital Sign    Worried About Running Out of Food in the Last Year: Never true    Ran Out of Food in the Last Year: Never true  Transportation Needs: No Transportation Needs (04/24/2024)   PRAPARE - Transportation    Lack of Transportation (Medical): No    Lack of Transportation (Non-Medical): No  Physical Activity: Not on file  Stress: Not on file  Social Connections: Not on file   Intimate Partner Violence: Unknown (04/24/2024)   Humiliation, Afraid, Rape, and Kick questionnaire    Fear of Current or Ex-Partner: No    Emotionally Abused: No    Physically Abused: No    Sexually Abused: Not on file    Past Medical History, Surgical history, Social history, and Family history were reviewed and updated as appropriate.   Please see review of systems for further details on the patient's review from today.   Objective:   Physical Exam:  There were no vitals taken for this visit.  Physical Exam Constitutional:      General: He is not in acute distress. Musculoskeletal:        General: No deformity.  Neurological:     Mental Status: He is alert and oriented to person, place, and time.  Coordination: Coordination normal.  Psychiatric:        Attention and Perception: Attention and perception normal. He does not perceive auditory or visual hallucinations.        Mood and Affect: Mood normal. Mood is not anxious or depressed. Affect is not labile, blunt, angry or inappropriate.        Speech: Speech normal.        Behavior: Behavior normal.        Thought Content: Thought content normal. Thought content is not paranoid or delusional. Thought content does not include homicidal or suicidal ideation. Thought content does not include homicidal or suicidal plan.        Cognition and Memory: Cognition and memory normal.        Judgment: Judgment normal.     Comments: Insight intact     Lab Review:     Component Value Date/Time   NA 143 04/24/2024 1549   K 4.1 04/24/2024 1549   CL 103 04/24/2024 1549   CO2 23 04/24/2024 1549   GLUCOSE 107 (H) 04/24/2024 1549   GLUCOSE 106 (H) 03/08/2023 1418   BUN 13 04/24/2024 1549   CREATININE 1.20 04/24/2024 1549   CREATININE 1.09 03/08/2023 1418   CALCIUM 9.1 04/24/2024 1549   PROT 6.6 04/24/2024 1549   ALBUMIN 4.4 04/24/2024 1549   AST 34 04/24/2024 1549   ALT 43 04/24/2024 1549   ALKPHOS 111 04/24/2024 1549    BILITOT 0.7 04/24/2024 1549   GFRNONAA 81 03/18/2020 1003   GFRAA 94 03/18/2020 1003       Component Value Date/Time   WBC 8.1 10/08/2023 1541   WBC 7.7 10/15/2022 0903   RBC 4.81 10/08/2023 1541   RBC 4.74 10/15/2022 0903   HGB 14.7 10/08/2023 1541   HCT 43.1 10/08/2023 1541   PLT 227 10/08/2023 1541   MCV 90 10/08/2023 1541   MCH 30.6 10/08/2023 1541   MCH 31.0 10/15/2022 0903   MCHC 34.1 10/08/2023 1541   MCHC 34.4 10/15/2022 0903   RDW 13.0 10/08/2023 1541    No results found for: POCLITH, LITHIUM   No results found for: PHENYTOIN, PHENOBARB, VALPROATE, CBMZ   .res Assessment: Plan:    Plan:  Adderall 20mg  TID   Seroquel  100mg  - 1 to 2 at hs   Monitor BP between visits while taking stimulant medication.  RTC 3 months  20 minutes spent dedicated to the care of this patient on the date of this encounter to include pre-visit review of records, ordering of medication, post visit documentation, and face-to-face time with the patient discussing ADHD, insomnia, MDD and PTSD. Discussed continuing current medication regimen.  Discussed potential metabolic side effects associated with atypical antipsychotics, as well as potential risk for movement side effects. Advised pt to contact office if movement side effects occur.   Discussed potential benefits, risks, and side effects of stimulants with patient to include increased heart rate, palpitations, insomnia, increased anxiety, increased irritability, or decreased appetite.  Instructed patient to contact office if experiencing any significant tolerability issues.  There are no diagnoses linked to this encounter.   Please see After Visit Summary for patient specific instructions.  Future Appointments  Date Time Provider Department Center  06/20/2024  2:30 PM Danique Hartsough Nattalie, NP [facility] None  10/24/2024  3:20 PM Alvan Gary BIRCH, MD [facility]    No orders of the defined types were placed  in this encounter.     -------------------------------

## 2024-06-28 ENCOUNTER — Telehealth: Payer: Self-pay | Admitting: Adult Health

## 2024-06-28 DIAGNOSIS — G4700 Insomnia, unspecified: Secondary | ICD-10-CM

## 2024-06-28 MED ORDER — QUETIAPINE FUMARATE 100 MG PO TABS: ORAL_TABLET | ORAL | 0 refills | Status: DC

## 2024-06-28 NOTE — Telephone Encounter (Signed)
 Patient called in for refill on Quetiapine  100mg . States pharmacy informed him the prescription expired earlier this month. Ph: [phone] Appt 1/20 Pharmacy [facility] [address] [place], [place]

## 2024-06-28 NOTE — Telephone Encounter (Signed)
 Sent a 90-day supply of Seroquel  to [facility] in [place].

## 2024-07-19 ENCOUNTER — Telehealth: Payer: Self-pay | Admitting: Adult Health

## 2024-07-19 DIAGNOSIS — F909 Attention-deficit hyperactivity disorder, unspecified type: Secondary | ICD-10-CM

## 2024-07-19 NOTE — Telephone Encounter (Signed)
 Gary Stevens called asking for a refill  on his adderall 20 mg. Pharmacy is [facility] [place]

## 2024-07-20 MED ORDER — AMPHETAMINE-DEXTROAMPHETAMINE 20 MG PO TABS: ORAL_TABLET | ORAL | 0 refills | Status: AC

## 2024-07-20 MED ORDER — AMPHETAMINE-DEXTROAMPHETAMINE 20 MG PO TABS: 20.0000 mg | ORAL_TABLET | Freq: Three times a day (TID) | ORAL | 0 refills | Status: AC

## 2024-07-20 NOTE — Telephone Encounter (Signed)
 Pended

## 2024-08-04 ENCOUNTER — Other Ambulatory Visit: Payer: Self-pay | Admitting: Family Medicine

## 2024-08-04 DIAGNOSIS — E119 Type 2 diabetes mellitus without complications: Secondary | ICD-10-CM

## 2024-08-04 NOTE — Telephone Encounter (Unsigned)
 Copied from CRM #[id number]. Topic: Clinical - Medication Refill >> Aug 04, 2024 12:25 PM Amy B wrote: Medication:  sitaGLIPtin -metformin  (JANUMET ) 50-1000 MG tablet  Has the patient contacted their pharmacy? No (Agent: If no, request that the patient contact the pharmacy for the refill. If patient does not wish to contact the pharmacy document the reason why and proceed with request.) (Agent: If yes, when and what did the pharmacy advise?)  This is the patient's preferred pharmacy:   [id number] [place], [place] - [address] [phone] [address] [place] [address]  Is this the correct pharmacy for this prescription? Yes If no, delete pharmacy and type the correct one.   Has the prescription been filled recently? No  Is the patient out of the medication? Yes  Has the patient been seen for an appointment in the last year OR does the patient have an upcoming appointment? Yes  Can we respond through MyChart? Yes  Agent: Please be advised that Rx refills may take up to 3 business days. We ask that you follow-up with your pharmacy.

## 2024-08-07 MED ORDER — JANUMET 50-1000 MG PO TABS: 1.0000 | ORAL_TABLET | Freq: Two times a day (BID) | ORAL | 0 refills | Status: AC

## 2024-09-05 ENCOUNTER — Telehealth: Payer: Self-pay | Admitting: Adult Health

## 2024-09-05 DIAGNOSIS — F909 Attention-deficit hyperactivity disorder, unspecified type: Secondary | ICD-10-CM

## 2024-09-05 MED ORDER — AMPHETAMINE-DEXTROAMPHETAMINE 20 MG PO TABS: ORAL_TABLET | ORAL | 0 refills | Status: AC

## 2024-09-05 NOTE — Telephone Encounter (Signed)
 Pt called at 10:02a requesting refill of Adderall to get transferred to [facility], [facility] in [place], [place] instead of [place], [place].  He said he doesn't remember picking up the script from 12/18.  Next appt 1/20

## 2024-09-05 NOTE — Telephone Encounter (Signed)
 Canceled Rx at [facility] in [place] and repended to [facility] in [place].

## 2024-09-05 NOTE — Telephone Encounter (Signed)
 LF 11/20

## 2024-09-19 ENCOUNTER — Encounter: Payer: Self-pay | Admitting: Adult Health

## 2024-09-19 ENCOUNTER — Telehealth: Admitting: Adult Health

## 2024-09-19 DIAGNOSIS — F329 Major depressive disorder, single episode, unspecified: Secondary | ICD-10-CM

## 2024-09-19 DIAGNOSIS — G4700 Insomnia, unspecified: Secondary | ICD-10-CM

## 2024-09-19 DIAGNOSIS — F331 Major depressive disorder, recurrent, moderate: Secondary | ICD-10-CM

## 2024-09-19 DIAGNOSIS — F4310 Post-traumatic stress disorder, unspecified: Secondary | ICD-10-CM

## 2024-09-19 DIAGNOSIS — F909 Attention-deficit hyperactivity disorder, unspecified type: Secondary | ICD-10-CM | POA: Diagnosis not present

## 2024-09-19 NOTE — Progress Notes (Signed)
 [name] [id number] 1966/04/25 59 y.o.  Virtual Visit via Video Note  I connected with pt @ on 09/19/24 at  2:30 PM EST by a video enabled telemedicine application and verified that I am speaking with the correct person using two identifiers.   I discussed the limitations of evaluation and management by telemedicine and the availability of in person appointments. The patient expressed understanding and agreed to proceed.  I discussed the assessment and treatment plan with the patient. The patient was provided an opportunity to ask questions and all were answered. The patient agreed with the plan and demonstrated an understanding of the instructions.   The patient was advised to call back or seek an in-person evaluation if the symptoms worsen or if the condition fails to improve as anticipated.  I provided 25 minutes of non-face-to-face time during this encounter.  The patient was located at home.  The provider was located at [facility].   [name], NP   Subjective:   Patient ID:  Gary Stevens is a 59 y.o. (DOB 1966/02/22) male.  Chief Complaint: No chief complaint on file.   HPI Gary Stevens presents for follow-up of ADHD, insomnia, MDD and PTSD.  Describes mood today as ok. Pleasant. Denies tearfulness. Mood symptoms - denies depression, anxiety and irritability. Reports stable interest and motivation. Denies panic attacks. Denies worry, rumination and over thinking. Reports mood is stable. Stating I feel like I'm doing ok. Feels like current medications work well. Taking medications as prescribed. Energy levels stable. Active, does not have a regular exercise routine.  Enjoys some usual interests and activities. Separated. Living alone. Family local.  Appetite fluctuates. Weight loss. Sleeps well most nights. Averages 8 to 10 hours with Seroquel .  Reports focus and concentration stable. Feels like the Adderall continues to work well. Completing  tasks. Managing aspects of household. Work going well - OTR naval architect.    Denies SI or HI.  Denies AH or VH.  Denies substance use. Denies self harm.  Review of Systems:  Review of Systems  Musculoskeletal:  Negative for gait problem.  Neurological:  Negative for tremors.  Psychiatric/Behavioral:         Please refer to HPI   Medications: I have reviewed the patient's current medications.  Current Outpatient Medications  Medication Sig Dispense Refill   amphetamine -dextroamphetamine  (ADDERALL) 20 MG tablet Take 1 tablet (20 mg total) by mouth 3 (three) times daily. 90 tablet 0   amphetamine -dextroamphetamine  (ADDERALL) 20 MG tablet Take one tablet three times a day. 90 tablet 0   amphetamine -dextroamphetamine  (ADDERALL) 20 MG tablet Take one tablet three times daily. 90 tablet 0   Blood Glucose Monitoring Suppl (ONE TOUCH ULTRA 2) w/Device KIT See admin instructions.  0   metoprolol  succinate (TOPROL -XL) 100 MG 24 hr tablet TAKE 1 TABLET BY MOUTH DAILY. TAKE WITH OR IMMEDIATELY FOLLOWING A MEAL. 90 tablet 1   neomycin -polymyxin-hydrocortisone (CORTISPORIN) OTIC solution Place 4 drops into the right ear 4 (four) times daily. X 7 days 10 mL 0   Olmesartan -amLODIPine -HCTZ 40-5-25 MG TABS TAKE 1 TABLET BY MOUTH EVERY DAY 90 tablet 1   ONE TOUCH ULTRA TEST test strip Check blood sugar 4 times daily 300 each prn   QUEtiapine  (SEROQUEL ) 100 MG tablet Take 1-2 tablets by mouth at bedtime 180 tablet 0   sitaGLIPtin -metformin  (JANUMET ) 50-1000 MG tablet Take 1 tablet by mouth 2 (two) times daily with a meal. 180 tablet 0   No current facility-administered medications  for this visit.    Medication Side Effects: None  Allergies: Allergies[1]  Past Medical History:  Diagnosis Date   ADHD 01/15/2009   Qualifier: Diagnosis of  By: Alvan MD, Catherine     Anxiety state, unspecified 01/15/2009   Qualifier: Diagnosis of  By: Alvan MD, Catherine     Essential hypertension, benign  06/06/2008   Qualifier: Diagnosis of  By: Alvan MD, Dorothyann DANES 08/12/2009   Qualifier: Diagnosis of  By: Alvan MD, Catherine     HYPERTROPHY PROSTATE W/UR OBST & OTH LUTS 01/15/2009   Qualifier: Diagnosis of  By: Alvan MD, Dorothyann BEGUN 06/06/2008   Qualifier: Diagnosis of  By: Alvan MD, Dorothyann     KNEE PAIN 08/12/2009   Qualifier: Diagnosis of  By: Alvan MD, Catherine     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    Family History  Problem Relation Age of Onset   Cirrhosis Father    Diabetes Mellitus II Father    Gallbladder disease Father    Heart attack Sister 52   Gallbladder disease Sister    Hypertension Sister    Gallbladder disease Son     Social History   Socioeconomic History   Marital status: Married    Spouse name: Not on file   Number of children: Not on file   Years of education: Not on file   Highest education level: Not on file  Occupational History   Not on file  Tobacco Use   Smoking status: Every Day    Current packs/day: 1.00    Average packs/day: 1 pack/day for 30.0 years (30.0 ttl pk-yrs)    Types: Cigarettes   Smokeless tobacco: Never  Substance and Sexual Activity   Alcohol use: Not on file   Drug use: Not on file   Sexual activity: Not on file  Other Topics Concern   Not on file  Social History Narrative   Not on file   Social Drivers of Health   Tobacco Use: High Risk (06/20/2024)   Patient History    Smoking Tobacco Use: Every Day    Smokeless Tobacco Use: Never    Passive Exposure: Not on file  Financial Resource Strain: Not on file  Food Insecurity: No Food Insecurity (04/24/2024)   Epic    Worried About Running Out of Food in the Last Year: Never true    Ran Out of Food in the Last Year: Never true  Transportation Needs: No Transportation Needs (04/24/2024)   Epic    Lack of Transportation (Medical): No    Lack of Transportation (Non-Medical): No  Physical Activity: Not on  file  Stress: Not on file  Social Connections: Not on file  Intimate Partner Violence: Unknown (04/24/2024)   Epic    Fear of Current or Ex-Partner: No    Emotionally Abused: No    Physically Abused: No    Sexually Abused: Not on file  Depression (PHQ2-9): Low Risk (04/24/2024)   Depression (PHQ2-9)    PHQ-2 Score: 0  Alcohol Screen: Not on file  Housing: Low Risk (04/24/2024)   Epic    Unable to Pay for Housing in the Last Year: No    Number of Times Moved in the Last Year: 0    Homeless in the Last Year: No  Utilities: Not At Risk (04/24/2024)   Epic    Threatened with loss of utilities: No  Health Literacy: Not on file    Past Medical History,  Surgical history, Social history, and Family history were reviewed and updated as appropriate.   Please see review of systems for further details on the patient's review from today.   Objective:   Physical Exam:  There were no vitals taken for this visit.  Physical Exam Constitutional:      General: He is not in acute distress. Musculoskeletal:        General: No deformity.  Neurological:     Mental Status: He is alert and oriented to person, place, and time.     Coordination: Coordination normal.  Psychiatric:        Attention and Perception: Attention and perception normal. He does not perceive auditory or visual hallucinations.        Mood and Affect: Mood normal. Mood is not anxious or depressed. Affect is not labile, blunt, angry or inappropriate.        Speech: Speech normal.        Behavior: Behavior normal.        Thought Content: Thought content normal. Thought content is not paranoid or delusional. Thought content does not include homicidal or suicidal ideation. Thought content does not include homicidal or suicidal plan.        Cognition and Memory: Cognition and memory normal.        Judgment: Judgment normal.     Comments: Insight intact     Lab Review:     Component Value Date/Time   NA 143 04/24/2024 1549    K 4.1 04/24/2024 1549   CL 103 04/24/2024 1549   CO2 23 04/24/2024 1549   GLUCOSE 107 (H) 04/24/2024 1549   GLUCOSE 106 (H) 03/08/2023 1418   BUN 13 04/24/2024 1549   CREATININE 1.20 04/24/2024 1549   CREATININE 1.09 03/08/2023 1418   CALCIUM 9.1 04/24/2024 1549   PROT 6.6 04/24/2024 1549   ALBUMIN 4.4 04/24/2024 1549   AST 34 04/24/2024 1549   ALT 43 04/24/2024 1549   ALKPHOS 111 04/24/2024 1549   BILITOT 0.7 04/24/2024 1549   GFRNONAA 81 03/18/2020 1003   GFRAA 94 03/18/2020 1003       Component Value Date/Time   WBC 8.1 10/08/2023 1541   WBC 7.7 10/15/2022 0903   RBC 4.81 10/08/2023 1541   RBC 4.74 10/15/2022 0903   HGB 14.7 10/08/2023 1541   HCT 43.1 10/08/2023 1541   PLT 227 10/08/2023 1541   MCV 90 10/08/2023 1541   MCH 30.6 10/08/2023 1541   MCH 31.0 10/15/2022 0903   MCHC 34.1 10/08/2023 1541   MCHC 34.4 10/15/2022 0903   RDW 13.0 10/08/2023 1541    No results found for: POCLITH, LITHIUM   No results found for: PHENYTOIN, PHENOBARB, VALPROATE, CBMZ   .res Assessment: Plan:    Plan:  Adderall 20mg  TID   Seroquel  100mg  - 1 to 2 at hs   Monitor BP between visits while taking stimulant medication.  RTC 3 months  25 minutes spent dedicated to the care of this patient on the date of this encounter to include pre-visit review of records, ordering of medication, post visit documentation, and face-to-face time with the patient discussing ADHD, insomnia, MDD and PTSD. Discussed continuing current medication regimen.  Discussed potential metabolic side effects associated with atypical antipsychotics, as well as potential risk for movement side effects. Advised pt to contact office if movement side effects occur.   Discussed potential benefits, risks, and side effects of stimulants with patient to include increased heart rate, palpitations, insomnia, increased anxiety, increased  irritability, or decreased appetite.  Instructed patient to contact office  if experiencing any significant tolerability issues.2  There are no diagnoses linked to this encounter.   Please see After Visit Summary for patient specific instructions.  Future Appointments  Date Time Provider Department Center  09/19/2024  2:30 PM Cru Kritikos Nattalie, NP [facility] None  09/20/2024  9:10 AM Alvan Dorothyann BIRCH, MD [facility] [place]    No orders of the defined types were placed in this encounter.     -------------------------------      [1]  Allergies Allergen Reactions   Mirtazapine     Other reaction(s): Unknown

## 2024-09-20 ENCOUNTER — Encounter: Payer: Self-pay | Admitting: Family Medicine

## 2024-09-20 ENCOUNTER — Ambulatory Visit: Admitting: Family Medicine

## 2024-09-20 VITALS — BP 134/78 | HR 69 | Ht 75.0 in | Wt 265.1 lb

## 2024-09-20 DIAGNOSIS — E118 Type 2 diabetes mellitus with unspecified complications: Secondary | ICD-10-CM | POA: Diagnosis not present

## 2024-09-20 DIAGNOSIS — I10 Essential (primary) hypertension: Secondary | ICD-10-CM

## 2024-09-20 DIAGNOSIS — N401 Enlarged prostate with lower urinary tract symptoms: Secondary | ICD-10-CM | POA: Diagnosis not present

## 2024-09-20 DIAGNOSIS — Z7984 Long term (current) use of oral hypoglycemic drugs: Secondary | ICD-10-CM

## 2024-09-20 DIAGNOSIS — N410 Acute prostatitis: Secondary | ICD-10-CM

## 2024-09-20 DIAGNOSIS — F909 Attention-deficit hyperactivity disorder, unspecified type: Secondary | ICD-10-CM | POA: Diagnosis not present

## 2024-09-20 DIAGNOSIS — N529 Male erectile dysfunction, unspecified: Secondary | ICD-10-CM

## 2024-09-20 LAB — POCT URINALYSIS DIP (CLINITEK)
Bilirubin, UA: NEGATIVE
Blood, UA: NEGATIVE
Glucose, UA: NEGATIVE mg/dL
Leukocytes, UA: NEGATIVE
Nitrite, UA: NEGATIVE
Spec Grav, UA: >=1.03 — AB
Urobilinogen, UA: 2 U/dL — AB
pH, UA: 6

## 2024-09-20 LAB — POCT GLYCOSYLATED HEMOGLOBIN (HGB A1C): Hemoglobin A1C: 6 % — AB (ref 4.0–5.6)

## 2024-09-20 MED ORDER — FINASTERIDE 5 MG PO TABS: 5.0000 mg | ORAL_TABLET | Freq: Every day | ORAL | 3 refills | Status: AC

## 2024-09-20 MED ORDER — CIPROFLOXACIN HCL 500 MG PO TABS: 500.0000 mg | ORAL_TABLET | Freq: Two times a day (BID) | ORAL | 0 refills | Status: AC

## 2024-09-20 NOTE — Progress Notes (Addendum)
 "  Established Patient Office Visit  Patient ID: Gary Stevens, male    DOB: 1966-05-26  Age: 59 y.o. MRN: [id number] PCP: Alvan Dorothyann BIRCH, MD  Chief Complaint  Patient presents with   Hypertension   Diabetes    Subjective:     HPI  Discussed the use of AI scribe software for clinical note transcription with the patient, who gave verbal consent to proceed.  History of Present Illness JOHANNES Stevens is a 59 year old male with a history of prostate issues who presents with urinary symptoms and back pain.  Lower urinary tract symptoms - Urinary symptoms present for over one month - Decreased urinary flow compared to baseline - Symptoms aggravated by prolonged sitting, especially during truck driving - Partial relief of symptoms when lying down - No current use of prostate medications - No prior discussion of these symptoms with a healthcare provider  Back pain - Back pain present for over one month - Pain aggravated by prolonged sitting - Partial relief when lying flat - Minimal relief with over-the-counter treatments including ibuprofen, Tylenol, and Voltaren gel  Prostate disease history and risk factors - History of prostate issues with similar previous flare-ups - Family history of prostate cancer - 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  Constitutional:      Appearance: Normal appearance.  HENT:     Head: Normocephalic and atraumatic.  Eyes:     Conjunctiva/sclera:  Conjunctivae normal.  Cardiovascular:     Rate and Rhythm: Normal rate and regular rhythm.  Pulmonary:     Effort: Pulmonary effort is normal.     Breath sounds: Normal breath sounds.  Genitourinary:    Prostate: Enlarged.     Rectum: Normal. No mass or external hemorrhoid. Normal anal tone.     Comments: Prostate 3+  Musculoskeletal:     Comments: Normal lumbar flexion, extension, and rotation right and left.   Skin:    General: Skin is warm and dry.  Neurological:     Mental Status: He is alert.  Psychiatric:        Mood and Affect: Mood normal.      Results for orders placed or performed in visit on 09/20/24  PSA  Result Value Ref Range   Prostate Specific Ag, Serum 0.6 0.0 - 4.0 ng/mL  CMP14+EGFR  Result Value Ref Range   Glucose 154 (H) 70 - 99 mg/dL   BUN 15 6 - 24 mg/dL   Creatinine, Ser 8.95 0.76 - 1.27 mg/dL   eGFR 83 >40 fO/fpw/8.26   BUN/Creatinine Ratio 14 9 - 20   Sodium 144 134 - 144 mmol/L   Potassium 3.6 3.5 - 5.2 mmol/L   Chloride 103 96 - 106 mmol/L   CO2 20 20 - 29 mmol/L   Calcium 9.5 8.7 - 10.2 mg/dL   Total Protein 6.9 6.0 - 8.5 g/dL  Albumin 4.6 3.8 - 4.9 g/dL   Globulin, Total 2.3 1.5 - 4.5 g/dL   Bilirubin Total 0.6 0.0 - 1.2 mg/dL   Alkaline Phosphatase 113 47 - 123 IU/L   AST 40 0 - 40 IU/L   ALT 39 0 - 44 IU/L  Lipid panel  Result Value Ref Range   Cholesterol, Total 182 100 - 199 mg/dL   Triglycerides 632 (H) 0 - 149 mg/dL   HDL 39 (L) >60 mg/dL   VLDL Cholesterol Cal 60 (H) 5 - 40 mg/dL   LDL Chol Calc (NIH) 83 0 - 99 mg/dL   Chol/HDL Ratio 4.7 0.0 - 5.0 ratio  POCT HgB A1C  Result Value Ref Range   Hemoglobin A1C 6.0 (A) 4.0 - 5.6 %   HbA1c POC (<> result, manual entry)     HbA1c, POC (prediabetic range)     HbA1c, POC (controlled diabetic range)    POCT URINALYSIS DIP (CLINITEK)  Result Value Ref Range   Color, UA other (A) yellow   Clarity, UA clear clear   Glucose, UA negative negative mg/dL   Bilirubin, UA negative  negative   Ketones, POC UA trace (5) (A) negative mg/dL   Spec Grav, UA >=8.969 (A) 1.010 - 1.025   Blood, UA negative negative   pH, UA 6.0 5.0 - 8.0   POC PROTEIN,UA trace negative, trace   Urobilinogen, UA 2.0 (A) 0.2 or 1.0 E.U./dL   Nitrite, UA Negative Negative   Leukocytes, UA Negative Negative      The 10-year ASCVD risk score (Arnett DK, et al., 2019) is: 31.4%    Assessment & Plan:   Problem List Items Addressed This Visit       Cardiovascular and Mediastinum   Essential hypertension - Primary    Essential hypertension Initial blood pressure elevated at 169/88 mmHg. - Rechecked blood pressure before leaving.       Relevant Orders   PSA (Completed)   CMP14+EGFR (Completed)   Lipid panel (Completed)     Endocrine   Controlled type 2 diabetes mellitus with complication, without long-term current use of insulin (HCC)    Type 2 diabetes mellitus A1c well-controlled at 6.0%. Discussed diet and weight management importance. - Continue Janumet . - Encouraged dietary management and weight control.      Relevant Orders   POCT HgB A1C (Completed)   PSA (Completed)   CMP14+EGFR (Completed)   Lipid panel (Completed)     Genitourinary   Benign prostatic hyperplasia with lower urinary tract symptoms   AUA score of 24 with QOL rated at unhappy.   Will start finasteride . F/U in 3 mo      Relevant Medications   finasteride  (PROSCAR ) 5 MG tablet   Other Relevant Orders   PSA (Completed)   CMP14+EGFR (Completed)   Lipid panel (Completed)   POCT URINALYSIS DIP (CLINITEK) (Completed)     Other   Erectile dysfunction   SHIM score of 8 = Moderate ED Would could also look at low dose cildalafil to help BPH and ED sxs as well.   Low TEST questionnaire was negative for possible low T       Attention deficit hyperactivity disorder (ADHD)   Managed with Adderall with Dr. Mozingo.       Other Visit Diagnoses       Acute prostatitis       Relevant  Medications   ciprofloxacin  (CIPRO ) 500 MG tablet       Assessment and Plan Assessment &  Plan Benign prostatic hyperplasia with acute prostatitis Chronic urinary symptoms with recent exacerbation. Prostate enlargement without nodules suggests BPH. Differential includes prostatitis. Discussed BPH's impact on urinary symptoms and medication options. - Prescribed antibiotics for prostatitis, Cipro  x 7 days  - Consider medication to shrink prostate, start finasteride  . - Ordered urine test.  UA only pos for trace protein.  - Checked PSA levels if due.     Return in about 6 months (around 03/20/2025) for Diabetes follow-up.    Dorothyann Byars, MD [facility] at [facility]   "

## 2024-09-20 NOTE — Assessment & Plan Note (Signed)
" °  Type 2 diabetes mellitus A1c well-controlled at 6.0%. Discussed diet and weight management importance. - Continue Janumet . - Encouraged dietary management and weight control. "

## 2024-09-20 NOTE — Assessment & Plan Note (Signed)
 Managed with Adderall with Dr. Mozingo.

## 2024-09-20 NOTE — Assessment & Plan Note (Signed)
" °  Essential hypertension Initial blood pressure elevated at 169/88 mmHg. - Rechecked blood pressure before leaving.  "

## 2024-09-21 ENCOUNTER — Telehealth: Payer: Self-pay | Admitting: Adult Health

## 2024-09-21 DIAGNOSIS — G4700 Insomnia, unspecified: Secondary | ICD-10-CM

## 2024-09-21 LAB — CMP14+EGFR
ALT: 39 IU/L (ref 0–44)
AST: 40 IU/L (ref 0–40)
Albumin: 4.6 g/dL (ref 3.8–4.9)
Alkaline Phosphatase: 113 IU/L (ref 47–123)
BUN/Creatinine Ratio: 14 (ref 9–20)
BUN: 15 mg/dL (ref 6–24)
Bilirubin Total: 0.6 mg/dL (ref 0.0–1.2)
CO2: 20 mmol/L (ref 20–29)
Calcium: 9.5 mg/dL (ref 8.7–10.2)
Chloride: 103 mmol/L (ref 96–106)
Creatinine, Ser: 1.04 mg/dL (ref 0.76–1.27)
Globulin, Total: 2.3 g/dL (ref 1.5–4.5)
Glucose: 154 mg/dL — ABNORMAL HIGH (ref 70–99)
Potassium: 3.6 mmol/L (ref 3.5–5.2)
Sodium: 144 mmol/L (ref 134–144)
Total Protein: 6.9 g/dL (ref 6.0–8.5)
eGFR: 83 mL/min/1.73

## 2024-09-21 LAB — LIPID PANEL
Chol/HDL Ratio: 4.7 ratio (ref 0.0–5.0)
Cholesterol, Total: 182 mg/dL (ref 100–199)
HDL: 39 mg/dL — ABNORMAL LOW
LDL Chol Calc (NIH): 83 mg/dL (ref 0–99)
Triglycerides: 367 mg/dL — ABNORMAL HIGH (ref 0–149)
VLDL Cholesterol Cal: 60 mg/dL — ABNORMAL HIGH (ref 5–40)

## 2024-09-21 LAB — PSA: Prostate Specific Ag, Serum: 0.6 ng/mL (ref 0.0–4.0)

## 2024-09-21 MED ORDER — QUETIAPINE FUMARATE 100 MG PO TABS: ORAL_TABLET | ORAL | 0 refills | Status: AC

## 2024-09-21 NOTE — Telephone Encounter (Signed)
 Gary Stevens called asking for a refill on his seroquel  100 mg.Pharmacy is [facility] in [place]

## 2024-09-21 NOTE — Telephone Encounter (Signed)
Sent to [facility]

## 2024-09-25 ENCOUNTER — Ambulatory Visit: Payer: Self-pay | Admitting: Family Medicine

## 2024-09-25 DIAGNOSIS — N529 Male erectile dysfunction, unspecified: Secondary | ICD-10-CM | POA: Insufficient documentation

## 2024-09-25 NOTE — Assessment & Plan Note (Addendum)
 SHIM score of 8 = Moderate ED Would could also look at low dose cildalafil to help BPH and ED sxs as well.   Low TEST questionnaire was negative for possible low T

## 2024-09-25 NOTE — Progress Notes (Signed)
 Hi Gary Stevens, your Triglycerides are high again but your LDL looks better.  Keep working on the [employer] and exercise. Your metabolic panel is normal.  Liver enzymes look good. Your prostate test looks good.

## 2024-09-25 NOTE — Assessment & Plan Note (Signed)
 AUA score of 24 with QOL rated at unhappy.   Will start finasteride . F/U in 3 mo

## 2024-10-24 ENCOUNTER — Ambulatory Visit: Admitting: Family Medicine

## 2024-12-18 ENCOUNTER — Telehealth: Admitting: Adult Health

## 2025-03-20 ENCOUNTER — Ambulatory Visit: Admitting: Family Medicine
